# Patient Record
Sex: Male | Born: 1940 | Race: White | Hispanic: No | Smoking: Never smoker
Health system: Southern US, Community
[De-identification: ages and names within clinical notes are randomized; demographics above are authoritative.]

## PROBLEM LIST (undated history)

## (undated) ENCOUNTER — Emergency Department (HOSPITAL_COMMUNITY): Payer: Medicare Other | Source: Home / Self Care

## (undated) DIAGNOSIS — H9190 Unspecified hearing loss, unspecified ear: Secondary | ICD-10-CM

## (undated) DIAGNOSIS — D509 Iron deficiency anemia, unspecified: Secondary | ICD-10-CM

## (undated) DIAGNOSIS — I509 Heart failure, unspecified: Secondary | ICD-10-CM

## (undated) DIAGNOSIS — L03116 Cellulitis of left lower limb: Secondary | ICD-10-CM

## (undated) DIAGNOSIS — E538 Deficiency of other specified B group vitamins: Secondary | ICD-10-CM

## (undated) DIAGNOSIS — I4891 Unspecified atrial fibrillation: Secondary | ICD-10-CM

## (undated) DIAGNOSIS — E785 Hyperlipidemia, unspecified: Secondary | ICD-10-CM

## (undated) DIAGNOSIS — R040 Epistaxis: Secondary | ICD-10-CM

## (undated) DIAGNOSIS — R339 Retention of urine, unspecified: Secondary | ICD-10-CM

## (undated) DIAGNOSIS — Q2112 Patent foramen ovale: Secondary | ICD-10-CM

## (undated) DIAGNOSIS — Q211 Atrial septal defect: Secondary | ICD-10-CM

## (undated) DIAGNOSIS — I10 Essential (primary) hypertension: Secondary | ICD-10-CM

## (undated) DIAGNOSIS — J3489 Other specified disorders of nose and nasal sinuses: Secondary | ICD-10-CM

## (undated) DIAGNOSIS — R319 Hematuria, unspecified: Secondary | ICD-10-CM

## (undated) HISTORY — DX: Deficiency of other specified B group vitamins: E53.8

## (undated) HISTORY — DX: Unspecified atrial fibrillation: I48.91

## (undated) HISTORY — PX: HERNIA REPAIR: SHX51

## (undated) HISTORY — DX: Hematuria, unspecified: R31.9

## (undated) HISTORY — DX: Hyperlipidemia, unspecified: E78.5

## (undated) HISTORY — DX: Epistaxis: R04.0

## (undated) HISTORY — DX: Iron deficiency anemia, unspecified: D50.9

## (undated) HISTORY — DX: Patent foramen ovale: Q21.12

## (undated) HISTORY — PX: CATARACT EXTRACTION: SUR2

## (undated) HISTORY — DX: Essential (primary) hypertension: I10

## (undated) HISTORY — PX: ROTATOR CUFF REPAIR: SHX139

## (undated) HISTORY — DX: Atrial septal defect: Q21.1

---

## 1998-06-28 HISTORY — PX: SPLENECTOMY: SUR1306

## 2000-12-16 ENCOUNTER — Ambulatory Visit (HOSPITAL_COMMUNITY): Admission: RE | Admit: 2000-12-16 | Discharge: 2000-12-16 | Payer: Self-pay | Admitting: Internal Medicine

## 2000-12-26 ENCOUNTER — Other Ambulatory Visit: Admission: RE | Admit: 2000-12-26 | Discharge: 2000-12-26 | Payer: Self-pay | Admitting: Family Medicine

## 2001-05-01 ENCOUNTER — Encounter: Payer: Self-pay | Admitting: Family Medicine

## 2001-05-01 ENCOUNTER — Ambulatory Visit (HOSPITAL_COMMUNITY): Admission: RE | Admit: 2001-05-01 | Discharge: 2001-05-01 | Payer: Self-pay | Admitting: Family Medicine

## 2002-07-11 ENCOUNTER — Ambulatory Visit: Admission: RE | Admit: 2002-07-11 | Discharge: 2002-07-11 | Payer: Self-pay | Admitting: *Deleted

## 2006-08-22 ENCOUNTER — Ambulatory Visit (HOSPITAL_COMMUNITY): Admission: RE | Admit: 2006-08-22 | Discharge: 2006-08-22 | Payer: Self-pay | Admitting: Family Medicine

## 2006-09-02 ENCOUNTER — Ambulatory Visit (HOSPITAL_COMMUNITY): Admission: RE | Admit: 2006-09-02 | Discharge: 2006-09-02 | Payer: Self-pay | Admitting: Internal Medicine

## 2006-09-02 ENCOUNTER — Ambulatory Visit: Payer: Self-pay | Admitting: Internal Medicine

## 2009-05-21 ENCOUNTER — Ambulatory Visit (HOSPITAL_COMMUNITY): Admission: RE | Admit: 2009-05-21 | Discharge: 2009-05-21 | Payer: Self-pay | Admitting: Family Medicine

## 2010-04-28 ENCOUNTER — Emergency Department (HOSPITAL_COMMUNITY): Admission: EM | Admit: 2010-04-28 | Discharge: 2010-04-28 | Payer: Self-pay | Admitting: Emergency Medicine

## 2010-08-17 ENCOUNTER — Ambulatory Visit (HOSPITAL_COMMUNITY)
Admission: RE | Admit: 2010-08-17 | Discharge: 2010-08-17 | Disposition: A | Discharge status: Home / Self Care | Payer: MEDICARE | Source: Ambulatory Visit | Attending: Family Medicine | Admitting: Family Medicine

## 2010-08-17 ENCOUNTER — Encounter (HOSPITAL_COMMUNITY): Payer: Self-pay

## 2010-08-17 ENCOUNTER — Other Ambulatory Visit (HOSPITAL_COMMUNITY): Payer: Self-pay | Admitting: Family Medicine

## 2010-08-17 DIAGNOSIS — J40 Bronchitis, not specified as acute or chronic: Secondary | ICD-10-CM

## 2010-08-17 DIAGNOSIS — IMO0002 Reserved for concepts with insufficient information to code with codable children: Secondary | ICD-10-CM

## 2010-08-17 DIAGNOSIS — J449 Chronic obstructive pulmonary disease, unspecified: Secondary | ICD-10-CM | POA: Insufficient documentation

## 2010-08-17 DIAGNOSIS — J4489 Other specified chronic obstructive pulmonary disease: Secondary | ICD-10-CM | POA: Insufficient documentation

## 2010-08-17 DIAGNOSIS — R059 Cough, unspecified: Secondary | ICD-10-CM | POA: Insufficient documentation

## 2010-08-17 DIAGNOSIS — R0602 Shortness of breath: Secondary | ICD-10-CM | POA: Insufficient documentation

## 2010-08-17 DIAGNOSIS — R05 Cough: Secondary | ICD-10-CM | POA: Insufficient documentation

## 2010-08-17 HISTORY — DX: Essential (primary) hypertension: I10

## 2010-08-18 ENCOUNTER — Encounter: Payer: Self-pay | Admitting: Internal Medicine

## 2010-08-18 ENCOUNTER — Encounter: Payer: Self-pay | Admitting: Urgent Care

## 2010-08-18 ENCOUNTER — Ambulatory Visit (INDEPENDENT_AMBULATORY_CARE_PROVIDER_SITE_OTHER): Payer: MEDICARE | Admitting: Urgent Care

## 2010-08-18 DIAGNOSIS — R1901 Right upper quadrant abdominal swelling, mass and lump: Secondary | ICD-10-CM

## 2010-08-18 DIAGNOSIS — K219 Gastro-esophageal reflux disease without esophagitis: Secondary | ICD-10-CM

## 2010-08-18 DIAGNOSIS — D518 Other vitamin B12 deficiency anemias: Secondary | ICD-10-CM | POA: Insufficient documentation

## 2010-08-18 DIAGNOSIS — D509 Iron deficiency anemia, unspecified: Secondary | ICD-10-CM | POA: Insufficient documentation

## 2010-08-20 ENCOUNTER — Encounter (INDEPENDENT_AMBULATORY_CARE_PROVIDER_SITE_OTHER): Payer: Self-pay | Admitting: *Deleted

## 2010-08-21 ENCOUNTER — Encounter: Payer: Self-pay | Admitting: Urgent Care
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
## 2010-08-25 NOTE — Letter (Signed)
Summary: Recall, Screening Colonoscopy Only  [facility]  [address]   [place], [place] [address]   Phone: [phone]  Fax: [phone]    August 20, 2010  [employer] [address] [place], [place]  [address]  [place] Dec 31, 1940   Dear Mr. DINGLEY,   Our records indicate it is time to schedule your colonoscopy.   Please call our office at [phone] and ask for the nurse.   Thank you, Hendricks Limes, LPN Cloria Spring, LPN  [facility] Ph: [phone]   Fax: [phone]

## 2010-08-25 NOTE — Letter (Signed)
Summary: [facility]   Imported By: Rexene Alberts 08/21/2010 13:41:35  _____________________________________________________________________  External Attachment:    Type:   Image     Comment:   External Document

## 2010-08-25 NOTE — Letter (Addendum)
Summary: [facility] POSS EGD ORDER  [facility] EGD ORDER   Imported By: Ave Filter 08/18/2010 16:15:23  _____________________________________________________________________  External Attachment:    Type:   Image     Comment:   External Document  Appended Document: [facility] POSS EGD ORDER Patients appt canceled by Dr. [name] due to cardiac problems

## 2010-09-01 ENCOUNTER — Ambulatory Visit (HOSPITAL_COMMUNITY): Admission: RE | Admit: 2010-09-01 | Payer: MEDICARE | Source: Ambulatory Visit | Admitting: Internal Medicine

## 2010-09-01 ENCOUNTER — Encounter: Payer: MEDICARE | Admitting: Internal Medicine

## 2010-09-01 HISTORY — PX: NM MYOCAR PERF WALL MOTION: HXRAD629

## 2010-09-08 LAB — CBC
Hemoglobin: 9.5 g/dL — ABNORMAL LOW (ref 13.0–17.0)
MCHC: 32.1 g/dL (ref 30.0–36.0)
Platelets: 398 10*3/uL (ref 150–400)
RBC: 3.6 MIL/uL — ABNORMAL LOW (ref 4.22–5.81)

## 2010-09-08 LAB — BASIC METABOLIC PANEL
Calcium: 9.7 mg/dL (ref 8.4–10.5)
Creatinine, Ser: 1.13 mg/dL (ref 0.4–1.5)
GFR calc Af Amer: >60 mL/min (ref >60)
GFR calc non Af Amer: >60 mL/min (ref >60)
Glucose, Bld: 75 mg/dL (ref 70–99)
Sodium: 143 mEq/L (ref 135–145)

## 2010-09-08 LAB — DIFFERENTIAL
Basophils Relative: 1 % (ref 0–1)
Monocytes Relative: 12 % (ref 3–12)
Neutro Abs: 2.3 10*3/uL (ref 1.7–7.7)
Neutrophils Relative %: 37 % — ABNORMAL LOW (ref 43–77)

## 2010-09-08 LAB — PROTIME-INR
INR: 1.05 (ref 0.00–1.49)
Prothrombin Time: 13.9 seconds (ref 11.6–15.2)

## 2010-11-13 NOTE — Op Note (Signed)
NAME:  Justin [name], Justin [name]                   ACCOUNT NO.:  [id number]   MEDICAL RECORD NO.:  [id number]          PATIENT TYPE:  AMB   LOCATION:  DAY                           FACILITY:  [facility]   PHYSICIAN:  R. Roetta Sessions, M.D. DATE OF BIRTH:  1940/12/07   DATE OF PROCEDURE:  09/02/2006  DATE OF DISCHARGE:                               OPERATIVE REPORT   INDICATIONS FOR PROCEDURE:  The patient is a 70 year old gentleman with  history colonic adenomas last colonoscopy 2002.  He has no bowel  symptoms currently. Colonoscopy is now being done as surveillance  maneuver.  This approach has discussed with the patient at length.  Potential risks, benefits and alternatives have been reviewed, questions  answered.  He is agreeable.  Please see documentation medical record.   PROCEDURE NOTE:  O2 saturation, blood pressure, pulse and respirations  monitored throughout entire procedure.  Conscious sedation Versed 2 mg  IV, Demerol 50 mg IV in divided doses.   INSTRUMENT:  Pentax video chip system.   FINDINGS:  Digital rectal exam revealed no abnormalities.   ENDOSCOPIC FINDINGS:  The prep was adequate.  Examination colonic mucosa  was undertaken from the rectosigmoid junction through the left,  transverse, right colon to appendiceal orifice, ileocecal valve and  cecum.  These structures were well seen and photographed for the record.  From this level scope was withdrawn.  All previously mentioned  mucosal  surfaces were again seen.  The colon appeared normal.  Scope was pulled  down to the rectum.  Thorough examination rectal mucosa including  retroflex view anal verge was undertaken.  The rectal mucosa appeared  normal.  The patient tolerated the procedure well, was reacted in  endoscopy.   IMPRESSION:  1. Normal rectum.  2. Normal colon.   RECOMMENDATIONS:  To repeat surveillance colonoscopy in 5 years.      Jonathon Bellows, M.D.  Electronically Signed     RMR/MEDQ  D:  09/02/2006  T:   09/02/2006  Job:  [id number]   cc:   Mila Homer. [name], M.D.  Fax: [phone]

## 2010-11-13 NOTE — Cardiovascular Report (Signed)
   NAME:  Justin [name], Justin [name]                             ACCOUNT NO.:  [id number]   MEDICAL RECORD NO.:  [id number]                   PATIENT TYPE:  AMB   LOCATION:  DFTL                                 FACILITY:  [facility]   PHYSICIAN:  Darlin Priestly, M.D.             DATE OF BIRTH:  [birth year]   DATE OF PROCEDURE:  07/11/2002  DATE OF DISCHARGE:                              CARDIAC CATHETERIZATION   PROCEDURE:  Transesophageal echocardiogram.   ATTENDING PHYSICIAN:  Darlin Priestly, M.D.   COMPLICATIONS:  None.   INDICATIONS FOR PROCEDURE:  The patient is a 70 year old male patient of Dr.  Kem Boroughs who recently underwent a 2D echocardiogram with noted left and  right atrial enlargement and questionable PFO.  He is now referred for  transesophageal echocardiogram to evaluate his intra-atrial septum.   DESCRIPTION OF PROCEDURE:  After giving informed written consent, the  patient was brought to the endoscopy suite in a fasting state.  The patient  had received 75 mg of Demerol and 2 mg of Versed.  The patient underwent  successful and uncomplicated transesophageal echocardiogram.   FINDINGS:  1. The left ventricle appeared to be in normal in size with normal systolic     function.  The estimated EF was 60%.  2. There was mild thickening of the aortic valve leaflets with no evidence     of significant area stenosis and trivial aortic regurgitation.  3. Mildly thickened mitral valve leaflets and mild mitral regurgitation.   IMPRESSION:  1. Tricuspid valve with trivial tricuspid regurgitation.  2. No evidence of intra-cardiac mass or thrombi noted.  3. There does appear to be a small patent foramen ovale with right-to-left     but no left-to-right shunting noted by color and contrast echocardiogram.  4. There is moderate left atrial enlargement.  5. Normal descending thoracic aorta.   CONCLUSION:  Successful transesophageal echocardiogram with findings noted  above.                                           Darlin Priestly, M.D.     RHM/MEDQ  D:  07/11/2002  T:  07/11/2002  Job:  [id number]   cc:   Dani Gobble, M.D.  [phone] [address]  [place]  [place] [address]  Fax: [phone]

## 2010-11-18 LAB — COMPREHENSIVE METABOLIC PANEL
ALT: 14 U/L (ref 10–40)
BUN: 27 mg/dL — AB (ref 4–21)
CO2: 24 mmol/L
Calcium: 10.2 mg/dL
Chloride: 106 mmol/L
Creat: 0.99
Glucose: 140
Total Bilirubin: 0.5 mg/dL

## 2010-11-18 LAB — CBC WITH DIFFERENTIAL/PLATELET
WBC: 6.6
platelet count: 419

## 2010-11-18 LAB — HEMOGLOBIN A1C: Hgb A1c MFr Bld: 7 % — AB (ref 4.0–6.0)

## 2011-03-24 ENCOUNTER — Encounter: Payer: Self-pay | Admitting: Urgent Care

## 2011-03-24 ENCOUNTER — Ambulatory Visit (INDEPENDENT_AMBULATORY_CARE_PROVIDER_SITE_OTHER): Payer: Medicare Other | Admitting: Urgent Care

## 2011-03-24 DIAGNOSIS — K219 Gastro-esophageal reflux disease without esophagitis: Secondary | ICD-10-CM

## 2011-03-24 DIAGNOSIS — R160 Hepatomegaly, not elsewhere classified: Secondary | ICD-10-CM

## 2011-03-24 DIAGNOSIS — D518 Other vitamin B12 deficiency anemias: Secondary | ICD-10-CM

## 2011-03-24 DIAGNOSIS — D509 Iron deficiency anemia, unspecified: Secondary | ICD-10-CM
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
## 2011-03-24 NOTE — Patient Instructions (Signed)
KEEP Colonoscopy & EGD as planned Do not take iron for 1 week before tests Take 1/2 metformin in morning and evening before your test, also take 1/2 glucotrol the day before your test Do not take all of your diabetes the day of the procedure, but bring all your meds to the hospital

## 2011-03-24 NOTE — Assessment & Plan Note (Addendum)
Hx IDA with normalization of Hgb on iron therapy for 7 months.  Hx heme positive stools.  Now on coumadin.  Hx chronic GERD.  Colonoscopy to r/o colorectal carcinoma, polyp, source for occult GI bleeding.  We did discuss the fact that he is at a slightly higher risk of GI bleeding given the fact that he is on Coumadin, however the benefits of remaining on the Coumadin may outweigh the risk of bleeding. We discussed the life threatening nature of an embolic event. I discussed his situation regarding his coumadin with Dr Jena Gauss as to whether he should remain on without intervention vs. stopping coumadin prior to the procedure.  Dr. Jena Gauss would like to have his coumadin held for 4 days.  We contacted Dr. Tonia Ghent to see if this would be safe.  He states that he can stop his coumadin for up to 5 days prior to procedure,  Other risks discussed include reaction to the medication, perforation, and infection. He agrees with all the above and consent will be obtained.  KEEP Colonoscopy & EGD as planned Do not take iron for 1 week before tests Take 1/2 metformin in morning and evening before your test, also take 1/2 glucotrol the day before your test Do not take all of your diabetes the day of the procedure, but bring all your meds to the hospital

## 2011-03-24 NOTE — Assessment & Plan Note (Signed)
Palpable fullness RUQ ?hepatomegaly.  Normal LFTs.  ?scar tissue from multiple abdominal wounds/surgeries.   Further work-up pending procedures.

## 2011-03-24 NOTE — Assessment & Plan Note (Signed)
EGD at time of colonoscopy to look for PUD, h pylori gastritis, erosive esophagitis, Barrett's esophagus.  May benefit from PPI.  Await findings.

## 2011-03-24 NOTE — Assessment & Plan Note (Signed)
Per PCP on oral B12 replacement

## 2011-03-25 ENCOUNTER — Other Ambulatory Visit: Payer: Self-pay | Admitting: Gastroenterology

## 2011-03-25 DIAGNOSIS — D509 Iron deficiency anemia, unspecified: Secondary | ICD-10-CM

## 2011-03-25 NOTE — Progress Notes (Signed)
Cc to PCP 

## 2011-03-29 HISTORY — PX: COLONOSCOPY: SHX174

## 2011-03-29 HISTORY — PX: ESOPHAGOGASTRODUODENOSCOPY: SHX1529

## 2011-04-01 NOTE — Progress Notes (Signed)
Please call Dr. Horton Marshall office to find out why patient is on Coumadin?  Pt believes A fib. Can his Coumadin be stopped for 4 days prior to his colonoscopy and EGD? Please let me ASAP. Thanks

## 2011-04-02 NOTE — Progress Notes (Signed)
Called Kristen at [facility]. She will check on this and call us back.

## 2011-04-02 NOTE — Progress Notes (Signed)
Julie-please let pt know he needs to stop his coumadin 4 days before his procedure Thanks

## 2011-04-02 NOTE — Progress Notes (Signed)
Sent request to [facility] and vascular.

## 2011-04-05 ENCOUNTER — Encounter: Payer: Self-pay | Admitting: Urgent Care

## 2011-04-05 NOTE — Progress Notes (Signed)
pts wife aware, she asked that I mail a letter as well just to remind them. Letter in mail to pt

## 2011-04-19 ENCOUNTER — Telehealth: Payer: Self-pay | Admitting: Gastroenterology

## 2011-04-19 NOTE — Telephone Encounter (Signed)
Pts wife called- pt has not been on clear liquids- they forgot- and asked for procedure to be moved- TCS/EGD is now scheduled for 04/21/11 @12 :45

## 2011-04-20 MED ORDER — SODIUM CHLORIDE 0.45 % IV SOLN
Freq: Once | INTRAVENOUS | Status: AC
  Administered 2011-04-21: 10:00:00 via INTRAVENOUS

## 2011-04-21 ENCOUNTER — Other Ambulatory Visit: Payer: Self-pay | Admitting: Internal Medicine

## 2011-04-21 ENCOUNTER — Encounter (HOSPITAL_COMMUNITY): Payer: Self-pay

## 2011-04-21 ENCOUNTER — Encounter (HOSPITAL_COMMUNITY)
Admission: RE | Disposition: A | Discharge status: Home / Self Care | Payer: Self-pay | Source: Ambulatory Visit | Attending: Internal Medicine

## 2011-04-21 ENCOUNTER — Ambulatory Visit (HOSPITAL_COMMUNITY)
Admission: RE | Admit: 2011-04-21 | Discharge: 2011-04-21 | Disposition: A | Discharge status: Home / Self Care | Payer: Medicare Other | Source: Ambulatory Visit | Attending: Internal Medicine | Admitting: Internal Medicine

## 2011-04-21 DIAGNOSIS — D509 Iron deficiency anemia, unspecified: Secondary | ICD-10-CM | POA: Insufficient documentation

## 2011-04-21 DIAGNOSIS — E785 Hyperlipidemia, unspecified: Secondary | ICD-10-CM | POA: Insufficient documentation

## 2011-04-21 DIAGNOSIS — E119 Type 2 diabetes mellitus without complications: Secondary | ICD-10-CM | POA: Insufficient documentation

## 2011-04-21 DIAGNOSIS — Z7901 Long term (current) use of anticoagulants: Secondary | ICD-10-CM | POA: Insufficient documentation

## 2011-04-21 DIAGNOSIS — K621 Rectal polyp: Secondary | ICD-10-CM

## 2011-04-21 DIAGNOSIS — K449 Diaphragmatic hernia without obstruction or gangrene: Secondary | ICD-10-CM | POA: Insufficient documentation

## 2011-04-21 DIAGNOSIS — K219 Gastro-esophageal reflux disease without esophagitis: Secondary | ICD-10-CM

## 2011-04-21 DIAGNOSIS — I10 Essential (primary) hypertension: Secondary | ICD-10-CM | POA: Insufficient documentation

## 2011-04-21 DIAGNOSIS — D128 Benign neoplasm of rectum: Secondary | ICD-10-CM | POA: Insufficient documentation

## 2011-04-21 DIAGNOSIS — D129 Benign neoplasm of anus and anal canal: Secondary | ICD-10-CM | POA: Insufficient documentation

## 2011-04-21 DIAGNOSIS — R195 Other fecal abnormalities: Secondary | ICD-10-CM

## 2011-04-21 DIAGNOSIS — Z79899 Other long term (current) drug therapy: Secondary | ICD-10-CM | POA: Insufficient documentation

## 2011-04-21 DIAGNOSIS — K620 Anal polyp: Secondary | ICD-10-CM

## 2011-04-21 DIAGNOSIS — K921 Melena: Secondary | ICD-10-CM | POA: Insufficient documentation

## 2011-04-21 DIAGNOSIS — K5730 Diverticulosis of large intestine without perforation or abscess without bleeding: Secondary | ICD-10-CM

## 2011-04-21 SURGERY — COLONOSCOPY WITH ESOPHAGOGASTRODUODENOSCOPY (EGD)
Anesthesia: Moderate Sedation

## 2011-04-21 MED ORDER — MIDAZOLAM HCL 5 MG/5ML IJ SOLN
INTRAMUSCULAR | Status: DC | PRN
  Administered 2011-04-21 (×2): 1 mg via INTRAVENOUS
  Administered 2011-04-21: 2 mg via INTRAVENOUS

## 2011-04-21 MED ORDER — STERILE WATER FOR IRRIGATION IR SOLN
Status: DC | PRN
  Administered 2011-04-21: 12:00:00

## 2011-04-21 MED ORDER — MIDAZOLAM HCL 5 MG/5ML IJ SOLN
INTRAMUSCULAR | Status: AC
  Filled 2011-04-21: qty 5

## 2011-04-21 MED ORDER — MEPERIDINE HCL 100 MG/ML IJ SOLN
INTRAMUSCULAR | Status: AC
  Filled 2011-04-21: qty 1

## 2011-04-21 MED ORDER — MEPERIDINE HCL 100 MG/ML IJ SOLN
INTRAMUSCULAR | Status: DC | PRN
  Administered 2011-04-21: 50 mg via INTRAVENOUS

## 2011-04-21 MED ORDER — BUTAMBEN-TETRACAINE-BENZOCAINE 2-2-14 % EX AERO
INHALATION_SPRAY | CUTANEOUS | Status: DC | PRN
  Administered 2011-04-21: 1 via TOPICAL
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
## 2011-05-18 ENCOUNTER — Ambulatory Visit (INDEPENDENT_AMBULATORY_CARE_PROVIDER_SITE_OTHER): Payer: Medicare Other | Admitting: Gastroenterology

## 2011-05-18 ENCOUNTER — Telehealth: Payer: Self-pay

## 2011-05-18 ENCOUNTER — Ambulatory Visit: Payer: Medicare Other | Admitting: Gastroenterology

## 2011-05-18 ENCOUNTER — Encounter: Payer: Self-pay | Admitting: Gastroenterology

## 2011-05-18 VITALS — BP 130/80 | HR 70 | Temp 97.6°F | Ht 72.0 in | Wt 228.8 lb

## 2011-05-18 DIAGNOSIS — R1901 Right upper quadrant abdominal swelling, mass and lump: Secondary | ICD-10-CM

## 2011-05-18 DIAGNOSIS — D509 Iron deficiency anemia, unspecified: Secondary | ICD-10-CM

## 2011-05-18 DIAGNOSIS — K219 Gastro-esophageal reflux disease without esophagitis: Secondary | ICD-10-CM

## 2011-05-18 DIAGNOSIS — D518 Other vitamin B12 deficiency anemias: Secondary | ICD-10-CM

## 2011-05-18 DIAGNOSIS — R160 Hepatomegaly, not elsewhere classified: Secondary | ICD-10-CM

## 2011-05-18 NOTE — Patient Instructions (Signed)
We will recheck your lab work in February 2013 to followup on anemia. You need to have an abdominal ultrasound to take a look at your liver. Please call us and let us know what day you would like to have this done in order coordinate it at the same time as your appointment with Dr. [name].

## 2011-05-18 NOTE — Assessment & Plan Note (Signed)
Fullness in the right upper quadrant region, questionable scar tissue versus hepatomegaly. Recommend abdominal ultrasound for further evaluation, patient would like to postpone until January. He will call to schedule when he is ready.

## 2011-05-18 NOTE — Progress Notes (Signed)
Primary Care Physician: Milana Obey, MD  Primary Gastroenterologist:  Roetta Sessions, MD  Chief Complaint  Patient presents with  . Follow-up    HPI: Justin [name] is a 70 y.o. male here for followup of recent procedures. He had an EGD and colonoscopy done to evaluate for iron deficiency anemia, Hemoccult-positive stool. He had a hiatal hernia, hyperplastic rectal polyps.  Patient is without complaints today. Denies melena, rectal bleeding, constipation, diarrhea, abdominal pain, vomiting, dysphagia. Rare heartburn.   The last time he was seen here, there was concern for possible hepatomegaly. Workup postponed until his procedures done.  Current Outpatient Prescriptions  Medication Sig Dispense Refill  . albuterol (PROAIR HFA) 108 (90 BASE) MCG/ACT inhaler Inhale 2 puffs into the lungs every 6 (six) hours as needed. For shortness of breath      . CINNAMON PO Take 1,000 mg by mouth daily.        Marland Kitchen diltiazem (CARDIZEM CD) 240 MG 24 hr capsule Take 240 mg by mouth daily.       Marland Kitchen doxazosin (CARDURA) 8 MG tablet Take 8 mg by mouth at bedtime.        . ferrous sulfate 325 (65 FE) MG tablet Take 325 mg by mouth daily with breakfast.        . furosemide (LASIX) 40 MG tablet Take 40 mg by mouth 2 (two) times daily.        Marland Kitchen gemfibrozil (LOPID) 600 MG tablet Take 600 mg by mouth 2 (two) times daily before a meal.        . glipiZIDE (GLUCOTROL) 10 MG tablet Take 10 mg by mouth daily.        . metFORMIN (GLUCOPHAGE) 1000 MG tablet Take 1,000 mg by mouth 2 (two) times daily.        . phenylephrine (NEO-SYNEPHRINE) 0.5 % nasal solution Place 1 drop into the nose every 4 (four) hours as needed. For congestion      . vitamin B-12 (CYANOCOBALAMIN) 1000 MCG tablet Take 1,000 mcg by mouth daily.        Marland Kitchen warfarin (COUMADIN) 5 MG tablet Take 5-7.5 mg by mouth daily. Takes 7.5 mg on Tuesday, Thursday, & Saturday. & takes 5 mg all other days        Allergies as of 05/18/2011  . (No Known Allergies)     ROS:  General: Negative for anorexia, weight loss, fever, chills, fatigue, weakness. ENT: Negative for hoarseness, difficulty swallowing , nasal congestion. CV: Negative for chest pain, angina, palpitations, dyspnea on exertion, peripheral edema.  Respiratory: Negative for dyspnea at rest, dyspnea on exertion, cough, sputum, wheezing.  GI: See history of present illness. GU:  Negative for dysuria, hematuria, urinary incontinence, urinary frequency, nocturnal urination.  Endo: Negative for unusual weight change.    Physical Examination:   BP 130/80  Pulse 70  Temp(Src) 97.6 F (36.4 C) (Temporal)  Ht 6' (1.829 m)  Wt 228 lb 12.8 oz (103.783 kg)  BMI 31.03 kg/m2  General: Well-nourished, well-developed in no acute distress.  Eyes: No icterus. Mouth: Oropharyngeal mucosa moist and pink , no lesions erythema or exudate. Lungs: Clear to auscultation bilaterally.  Heart: Regular rate and rhythm, no murmurs rubs or gallops.  Abdomen: Bowel sounds are normal, nontender, nondistended, fullness in the right upper quadrant, no abdominal bruits or hernia , no rebound or guarding.   Extremities: No lower extremity edema. No clubbing or deformities. Neuro: Alert and oriented x 4   Skin: Warm and dry, no  jaundice.   Psych: Alert and cooperative, normal mood and affect.

## 2011-05-18 NOTE — Assessment & Plan Note (Signed)
Iron deficiency anemia, B12 deficiency, Hemoccult-positive stool. His hemoglobin normalized on supplements. Nothing on EGD or colonoscopy to explain findings. Would recommend rechecking CBC in 3 months. Unless he shows decline in his hemoglobin, no further workup needed at this point.

## 2011-05-18 NOTE — Telephone Encounter (Signed)
Mrs. Goeller called and said they would like to schedule pt's [place] on 07/26/2011 in the afternoon. He has a 10:15 AM appt with Dr. [name] and wants to avoid a second trip to [place].

## 2011-05-18 NOTE — Progress Notes (Signed)
Cc to PCP 

## 2011-05-18 NOTE — Assessment & Plan Note (Signed)
Doing better at this point.

## 2011-05-24 NOTE — Progress Notes (Signed)
REVIEWED.  

## 2011-05-31 NOTE — Telephone Encounter (Signed)
Crystal is aware

## 2011-06-28 ENCOUNTER — Encounter: Payer: Self-pay | Admitting: Gastroenterology

## 2011-06-28 ENCOUNTER — Other Ambulatory Visit: Payer: Self-pay | Admitting: Gastroenterology

## 2011-06-28 DIAGNOSIS — R160 Hepatomegaly, not elsewhere classified: Secondary | ICD-10-CM

## 2011-07-26 ENCOUNTER — Ambulatory Visit (HOSPITAL_COMMUNITY)
Admission: RE | Admit: 2011-07-26 | Discharge: 2011-07-26 | Disposition: A | Discharge status: Home / Self Care | Payer: Medicare Other | Source: Ambulatory Visit | Attending: Gastroenterology | Admitting: Gastroenterology

## 2011-07-26 DIAGNOSIS — R932 Abnormal findings on diagnostic imaging of liver and biliary tract: Secondary | ICD-10-CM | POA: Insufficient documentation

## 2011-07-26 DIAGNOSIS — R160 Hepatomegaly, not elsewhere classified: Secondary | ICD-10-CM

## 2011-07-26 NOTE — Progress Notes (Signed)
Quick Note:  Please let patient know, liver slighly prominent in size. Gallbladder with stones, ?chronic cholecystitis. Right kidney lesions, ?complex cyst. Needs further evaluation.  Please schedule MRI abd with/without contrast to further evaluate right kidney lesion seen on u/s. (make sure he does not have metal in his body and if so, check with mri tech to see if he can have mri done). Please find out if patient has his labs yet, possibly having done at PCP. Please check. Arrange for ov here to follow MRI results.  If patient develops ruq pain, n/v, he should go to ED. We will discuss if gb needs to come out at next visit but go to ed in interim if develops symptoms.   ______
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
## 2011-07-28 ENCOUNTER — Other Ambulatory Visit: Payer: Self-pay | Admitting: Gastroenterology

## 2011-07-28 DIAGNOSIS — N289 Disorder of kidney and ureter, unspecified: Secondary | ICD-10-CM

## 2011-07-28 NOTE — Progress Notes (Signed)
Requested.

## 2011-08-03 ENCOUNTER — Ambulatory Visit (HOSPITAL_COMMUNITY)
Admission: RE | Admit: 2011-08-03 | Discharge: 2011-08-03 | Disposition: A | Discharge status: Home / Self Care | Payer: Medicare Other | Source: Ambulatory Visit | Attending: Gastroenterology | Admitting: Gastroenterology

## 2011-08-03 DIAGNOSIS — N289 Disorder of kidney and ureter, unspecified: Secondary | ICD-10-CM

## 2011-08-03 DIAGNOSIS — K8020 Calculus of gallbladder without cholecystitis without obstruction: Secondary | ICD-10-CM | POA: Insufficient documentation

## 2011-08-03 DIAGNOSIS — K449 Diaphragmatic hernia without obstruction or gangrene: Secondary | ICD-10-CM | POA: Insufficient documentation

## 2011-08-03 DIAGNOSIS — Q619 Cystic kidney disease, unspecified: Secondary | ICD-10-CM | POA: Insufficient documentation

## 2011-08-03 MED ORDER — GADOBENATE DIMEGLUMINE 529 MG/ML IV SOLN
20.0000 mL | Freq: Once | INTRAVENOUS | Status: AC | PRN
  Administered 2011-08-03: 20 mL via INTRAVENOUS

## 2011-08-05 NOTE — Progress Notes (Signed)
Quick Note:  Benign bilateral renal cysts. Very good! He has gallstones but no evidence of acute gb or chronic gb disease.  On MRI liver OK, mildly prominent of u/s. LFTs normal.  Recommend tight control of DM, slow gradual weight loss towards initial goal of 10 pound in next three months.  CBC as planned in future. ______

## 2012-02-22 ENCOUNTER — Encounter (HOSPITAL_COMMUNITY): Payer: Self-pay | Admitting: Emergency Medicine

## 2012-02-22 ENCOUNTER — Emergency Department (HOSPITAL_COMMUNITY): Payer: Medicare Other

## 2012-02-22 ENCOUNTER — Observation Stay (HOSPITAL_COMMUNITY)
Admission: EM | Admit: 2012-02-22 | Discharge: 2012-02-24 | DRG: 605 | Disposition: A | Discharge status: Home / Self Care | Payer: Medicare Other | Attending: Family Medicine | Admitting: Family Medicine

## 2012-02-22 DIAGNOSIS — E785 Hyperlipidemia, unspecified: Secondary | ICD-10-CM | POA: Diagnosis present

## 2012-02-22 DIAGNOSIS — E538 Deficiency of other specified B group vitamins: Secondary | ICD-10-CM | POA: Diagnosis present

## 2012-02-22 DIAGNOSIS — M79609 Pain in unspecified limb: Secondary | ICD-10-CM | POA: Insufficient documentation

## 2012-02-22 DIAGNOSIS — I10 Essential (primary) hypertension: Secondary | ICD-10-CM | POA: Diagnosis present

## 2012-02-22 DIAGNOSIS — E119 Type 2 diabetes mellitus without complications: Secondary | ICD-10-CM | POA: Diagnosis present

## 2012-02-22 DIAGNOSIS — Z7901 Long term (current) use of anticoagulants: Secondary | ICD-10-CM

## 2012-02-22 DIAGNOSIS — I4891 Unspecified atrial fibrillation: Secondary | ICD-10-CM | POA: Diagnosis present

## 2012-02-22 DIAGNOSIS — X58XXXA Exposure to other specified factors, initial encounter: Secondary | ICD-10-CM

## 2012-02-22 DIAGNOSIS — Q2111 Secundum atrial septal defect: Secondary | ICD-10-CM

## 2012-02-22 DIAGNOSIS — S8010XA Contusion of unspecified lower leg, initial encounter: Principal | ICD-10-CM | POA: Diagnosis present

## 2012-02-22 DIAGNOSIS — Q211 Atrial septal defect: Secondary | ICD-10-CM

## 2012-02-22 DIAGNOSIS — D509 Iron deficiency anemia, unspecified: Secondary | ICD-10-CM | POA: Diagnosis present

## 2012-02-22 DIAGNOSIS — Z79899 Other long term (current) drug therapy: Secondary | ICD-10-CM

## 2012-02-22 DIAGNOSIS — L03116 Cellulitis of left lower limb: Secondary | ICD-10-CM

## 2012-02-22 DIAGNOSIS — Y93H9 Activity, other involving exterior property and land maintenance, building and construction: Secondary | ICD-10-CM

## 2012-02-22 LAB — CBC WITH DIFFERENTIAL/PLATELET
Eosinophils Relative: 2 % (ref 0–5)
HCT: 43.2 % (ref 39.0–52.0)
Hemoglobin: 14.4 g/dL (ref 13.0–17.0)
Lymphocytes Relative: 35 % (ref 12–46)
Lymphs Abs: 2.8 10*3/uL (ref 0.7–4.0)
MCV: 93.3 fL (ref 78.0–100.0)
Monocytes Absolute: 0.8 10*3/uL (ref 0.1–1.0)
Monocytes Relative: 11 % (ref 3–12)
RDW: 14.5 % (ref 11.5–15.5)
WBC: 7.8 10*3/uL (ref 4.0–10.5)

## 2012-02-22 LAB — BASIC METABOLIC PANEL
BUN: 14 mg/dL (ref 6–23)
CO2: 26 mEq/L (ref 19–32)
Calcium: 10.4 mg/dL (ref 8.4–10.5)
Creatinine, Ser: 0.81 mg/dL (ref 0.50–1.35)
Glucose, Bld: 79 mg/dL (ref 70–99)

## 2012-02-22 LAB — GLUCOSE, CAPILLARY: Glucose-Capillary: 88 mg/dL (ref 70–99)

## 2012-02-22 LAB — PROTIME-INR: INR: 2.96 — ABNORMAL HIGH (ref 0.00–1.49)

## 2012-02-22 MED ORDER — SODIUM CHLORIDE 0.9 % IJ SOLN
INTRAMUSCULAR | Status: AC
  Filled 2012-02-22: qty 3

## 2012-02-22 MED ORDER — KETOROLAC TROMETHAMINE 30 MG/ML IJ SOLN
30.0000 mg | Freq: Once | INTRAMUSCULAR | Status: AC
  Administered 2012-02-22: 30 mg via INTRAVENOUS
  Filled 2012-02-22: qty 1

## 2012-02-22 MED ORDER — INSULIN ASPART 100 UNIT/ML ~~LOC~~ SOLN: 0.0000 [IU] | Freq: Every day | SUBCUTANEOUS | Status: DC

## 2012-02-22 MED ORDER — DOXAZOSIN MESYLATE 8 MG PO TABS
8.0000 mg | ORAL_TABLET | Freq: Every day | ORAL | Status: DC
  Administered 2012-02-22 – 2012-02-23 (×2): 8 mg via ORAL
  Filled 2012-02-22 (×2): qty 1

## 2012-02-22 MED ORDER — ONDANSETRON HCL 4 MG/2ML IJ SOLN
4.0000 mg | Freq: Once | INTRAMUSCULAR | Status: AC
  Administered 2012-02-22: 4 mg via INTRAVENOUS
  Filled 2012-02-22: qty 2

## 2012-02-22 MED ORDER — PIPERACILLIN-TAZOBACTAM 3.375 G IVPB
3.3750 g | Freq: Three times a day (TID) | INTRAVENOUS | Status: DC
  Administered 2012-02-22 – 2012-02-24 (×5): 3.375 g via INTRAVENOUS
  Filled 2012-02-22 (×8): qty 50

## 2012-02-22 MED ORDER — HYDROCODONE-ACETAMINOPHEN 5-325 MG PO TABS
1.0000 | ORAL_TABLET | Freq: Four times a day (QID) | ORAL | Status: DC | PRN
  Administered 2012-02-22: 2 via ORAL
  Administered 2012-02-23: 1 via ORAL
  Administered 2012-02-23 (×2): 2 via ORAL
  Administered 2012-02-23: 1 via ORAL
  Administered 2012-02-24: 2 via ORAL
  Filled 2012-02-22 (×3): qty 2
  Filled 2012-02-22 (×2): qty 1
  Filled 2012-02-22 (×2): qty 2

## 2012-02-22 MED ORDER — FERROUS SULFATE 325 (65 FE) MG PO TABS
325.0000 mg | ORAL_TABLET | Freq: Two times a day (BID) | ORAL | Status: DC
  Administered 2012-02-22 – 2012-02-24 (×4): 325 mg via ORAL
  Filled 2012-02-22 (×4): qty 1

## 2012-02-22 MED ORDER — INSULIN ASPART 100 UNIT/ML ~~LOC~~ SOLN
0.0000 [IU] | Freq: Three times a day (TID) | SUBCUTANEOUS | Status: DC
  Administered 2012-02-23 – 2012-02-24 (×3): 1 [IU] via SUBCUTANEOUS

## 2012-02-22 MED ORDER — HYDROMORPHONE HCL PF 1 MG/ML IJ SOLN
0.5000 mg | Freq: Once | INTRAMUSCULAR | Status: AC
  Administered 2012-02-22: 0.5 mg via INTRAVENOUS
  Filled 2012-02-22: qty 1

## 2012-02-22 MED ORDER — WARFARIN SODIUM 5 MG PO TABS
5.0000 mg | ORAL_TABLET | Freq: Once | ORAL | Status: AC
  Administered 2012-02-22: 5 mg via ORAL
  Filled 2012-02-22: qty 1

## 2012-02-22 MED ORDER — VANCOMYCIN HCL IN DEXTROSE 1-5 GM/200ML-% IV SOLN
1000.0000 mg | Freq: Two times a day (BID) | INTRAVENOUS | Status: DC
  Administered 2012-02-23 – 2012-02-24 (×3): 1000 mg via INTRAVENOUS
  Filled 2012-02-22 (×3): qty 200

## 2012-02-22 MED ORDER — VANCOMYCIN HCL IN DEXTROSE 1-5 GM/200ML-% IV SOLN
1000.0000 mg | Freq: Once | INTRAVENOUS | Status: AC
  Administered 2012-02-22: 1000 mg via INTRAVENOUS
  Filled 2012-02-22: qty 200

## 2012-02-22 MED ORDER — WARFARIN - PHARMACIST DOSING INPATIENT: Freq: Every day | Status: DC

## 2012-02-22 NOTE — ED Notes (Signed)
Pt has knot to left lower leg. Pt states calf felt tight. Thought maybe a bug bit him.
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
## 2012-02-22 NOTE — ED Notes (Signed)
Crackers and diet drink given. Offered wife some.

## 2012-02-22 NOTE — ED Notes (Signed)
Patient c/o increased pain. EDP made aware, orders given.

## 2012-02-23 LAB — GLUCOSE, CAPILLARY
Glucose-Capillary: 98 mg/dL (ref 70–99)
Glucose-Capillary: 109 mg/dL — ABNORMAL HIGH (ref 70–99)

## 2012-02-23 LAB — PROTIME-INR: INR: 3.23 — ABNORMAL HIGH (ref 0.00–1.49)

## 2012-02-23 MED ORDER — VANCOMYCIN HCL IN DEXTROSE 1-5 GM/200ML-% IV SOLN
INTRAVENOUS | Status: AC
  Filled 2012-02-23: qty 200

## 2012-02-23 NOTE — Progress Notes (Signed)
Dr. Felecia Shelling paged, who is on call for Dr. [name], made aware of new admit from ER. Verbal admission orders taken. Family at bedside. Will continue to monitor.

## 2012-02-23 NOTE — Progress Notes (Signed)
Subjective: Complains of pain and swelling of the lt leg. No fever or chills.  Objective: Vital signs in last 24 hours: Temp:  [97.6 F (36.4 C)-97.9 F (36.6 C)] 97.6 F (36.4 C) (08/28 0539) Pulse Rate:  [72-100] 72  (08/28 0539) Resp:  [18-20] 20  (08/28 0539) BP: (118-134)/(76-92) 118/77 mmHg (08/28 0539) SpO2:  [92 %-99 %] 92 % (08/28 0539) Weight:  [97 kg (213 lb 13.5 oz)-97.07 kg (214 lb)] 97 kg (213 lb 13.5 oz) (08/27 2017) Weight change:  Last BM Date: 02/21/12  Intake/Output from previous day: 08/27 0701 - 08/28 0700 In: 540 [P.O.:240; IV Piggyback:300] Out: 300 [Urine:300]  PHYSICAL EXAM General appearance: alert and no distress Resp: clear to auscultation bilaterally Cardio: S1, S2 normal GI: soft, non-tender; bowel sounds normal; no masses,  no organomegaly Extremities: tender flactuating swelling measuring around 4cm on lt leg  Lab Results:    @labtest @ [facility] No results found for this basename: PHART,PCO2,PO2ART,TCO2,HCO3 in the last 72 hours CULTURES No results found for this or any previous visit (from the past 240 hour(s)). Studies/Results: US Venous Img Lower Unilateral Left  02/22/2012  *RADIOLOGY REPORT*  Clinical Data: Left calf protrusion.  LEFT LOWER EXTREMITY VENOUS DUPLEX ULTRASOUND  Technique:  Gray-scale sonography with graded compression, as well as color Doppler and duplex ultrasound, were performed to evaluate the deep venous system of the lower extremity from the level of the common femoral vein through the popliteal and proximal calf veins. Spectral Doppler was utilized to evaluate flow at rest and with distal augmentation maneuvers.  Comparison:  None.  Findings: Deep veins of the left lower extremity are normally patent and without evidence of thrombus.  There is a complex fluid collection in the posterior mid calf measuring roughly 4 x 2 x 4 cm.  This may represent hematoma versus abscess given appearance. This is not a typical location for a  Baker's cyst.  IMPRESSION: No evidence of left lower extremity DVT.  4 cm complex fluid collection of the mid calf having appearance of either hematoma or abscess.   Original Report Authenticated By: Reola Calkins, M.D.     Medications: I have reviewed the patient's current medications.  Assesment: 1. Cellulitis and abscess on the lt leg 2. DM type II 3. Hypertension 4. Atrial fibrillation on anticoagulation  Active Problems:  * No active hospital problems. *     Plan: 1. Continue IV antibiotics 2. Surgical consult 3. Continue anticoagulation. 4. Continue regular treatment.    LOS: 1 day   Tesla Keeler 02/23/2012, 8:12 AM
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
## 2012-02-23 NOTE — Consult Note (Signed)
ANTIBIOTIC CONSULT NOTE - INITIAL  Pharmacy Consult for Vancomycin Indication: cellulitis of leg  No Known Allergies  Patient Measurements: Height: 5\' 9"  (175.3 cm) Weight: 213 lb 13.5 oz (97 kg) IBW/kg (Calculated) : 70.7   Vital Signs: Temp: 97.6 F (36.4 C) (08/28 0539) Temp src: Oral (08/28 0539) BP: 118/77 mmHg (08/28 0539) Pulse Rate: 72  (08/28 0539) Intake/Output from previous day: 08/27 0701 - 08/28 0700 In: 540 [P.O.:240; IV Piggyback:300] Out: 300 [Urine:300] Intake/Output from this shift:    Labs:  [facility] 02/22/12 1546  WBC 7.8  HGB 14.4  PLT 320  LABCREA --  CREATININE 0.81   Estimated Creatinine Clearance: 94.7 ml/min (by C-G formula based on Cr of 0.81). No results found for this basename: VANCOTROUGH:2,VANCOPEAK:2,VANCORANDOM:2,GENTTROUGH:2,GENTPEAK:2,GENTRANDOM:2,TOBRATROUGH:2,TOBRAPEAK:2,TOBRARND:2,AMIKACINPEAK:2,AMIKACINTROU:2,AMIKACIN:2, in the last 72 hours   Microbiology: No results found for this or any previous visit (from the past 720 hour(s)).  Medical History: Past Medical History  Diagnosis Date  . Hypertension   . Diabetes mellitus   . IDA (iron deficiency anemia)   . Vitamin B 12 deficiency   . Hyperlipidemia   . HTN (hypertension)   . Epistaxis   . Atrial fibrillation     Sees Dr Tonia Ghent, pt/wife unsure but believe this is why he is on coumadin  . PFO (patent foramen ovale)    Medications:  Scheduled:    . doxazosin  8 mg Oral QHS  . ferrous sulfate  325 mg Oral BID  .  HYDROmorphone (DILAUDID) injection  0.5 mg Intravenous Once  . insulin aspart  0-5 Units Subcutaneous QHS  . insulin aspart  0-9 Units Subcutaneous TID WC  . ketorolac  30 mg Intravenous Once  . ondansetron  4 mg Intravenous Once  . piperacillin-tazobactam (ZOSYN)  IV  3.375 g Intravenous Q8H  . sodium chloride      . vancomycin  1,000 mg Intravenous Once  . vancomycin  1,000 mg Intravenous Q12H  . warfarin  5 mg Oral Once  . Warfarin - Pharmacist  Dosing Inpatient   Does not apply q1800   Assessment: 71yo male c/o pain and swelling in leg.  Good renal fxn. Estimated Creatinine Clearance: 94.7 ml/min (by C-G formula based on Cr of 0.81).  Goal of Therapy:  Vancomycin trough level 10-15 mcg/ml  Plan: Vancomycin 1gm iv q12hrs Check trough at steady state Monitor labs, renal fxn, and cultures per protocol  Valrie Hart A 02/23/2012,9:31 AM

## 2012-02-23 NOTE — Care Management Note (Signed)
    Page 1 of 1   02/24/2012     10:13:22 AM   CARE MANAGEMENT NOTE 02/24/2012  Patient:  Justin [name], Justin [name]   Account Number:  [id number]  Date Initiated:  02/23/2012  Documentation initiated by:  Sharrie Rothman  Subjective/Objective Assessment:   Pt admitted from home with cellulitis of left lower extremity. Pt lives with wife and will return home at discharge. Pt is independent with ADL's.     Action/Plan:   No CM/HH needs at this time. Will continue to follow for any needs.   Anticipated DC Date:  02/25/2012   Anticipated DC Plan:  HOME/SELF CARE      DC Planning Services  CM consult      Choice offered to / List presented to:             Status of service:  Completed, signed off Medicare Important Message given?   (If response is "NO", the following Medicare IM given date fields will be blank) Date Medicare IM given:   Date Additional Medicare IM given:    Discharge Disposition:  HOME/SELF CARE  Per UR Regulation:    If discussed at Long Length of Stay Meetings, dates discussed:    Comments:  02/24/12 1011 Arlyss Queen, RN BSN CM Pt discharged home today. No CM/HH needs noted.  02/23/12 1345 Arlyss Queen, RN BSN CM

## 2012-02-23 NOTE — Progress Notes (Signed)
UR Chart Review Completed  
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
## 2012-02-23 NOTE — Consult Note (Signed)
ANTICOAGULATION CONSULT NOTE - Initial Consult  Pharmacy Consult for Warfarin Indication: atrial fibrillation  No Known Allergies  Patient Measurements: Height: 5\' 9"  (175.3 cm) Weight: 213 lb 13.5 oz (97 kg) IBW/kg (Calculated) : 70.7   Vital Signs: Temp: 97.6 F (36.4 C) (08/28 0539) Temp src: Oral (08/28 0539) BP: 118/77 mmHg (08/28 0539) Pulse Rate: 72  (08/28 0539)  Labs:  Justin [name] 02/23/12 0533 02/22/12 2137 02/22/12 1546  HGB -- -- 14.4  HCT -- -- 43.2  PLT -- -- 320  APTT -- -- --  LABPROT 33.5* 31.3* --  INR 3.23* 2.96* --  HEPARINUNFRC -- -- --  CREATININE -- -- 0.81  CKTOTAL -- -- --  CKMB -- -- --  TROPONINI -- -- --    Estimated Creatinine Clearance: 94.7 ml/min (by C-G formula based on Cr of 0.81).  Medical History: Past Medical History  Diagnosis Date  . Hypertension   . Diabetes mellitus   . IDA (iron deficiency anemia)   . Vitamin B 12 deficiency   . Hyperlipidemia   . HTN (hypertension)   . Epistaxis   . Atrial fibrillation     Sees Dr Tonia Ghent, pt/wife unsure but believe this is why he is on coumadin  . PFO (patent foramen ovale)    Medications:  Prescriptions prior to admission  Medication Sig Dispense Refill  . albuterol (PROAIR HFA) 108 (90 BASE) MCG/ACT inhaler Inhale 2 puffs into the lungs every 6 (six) hours as needed. For shortness of breath      . CINNAMON PO Take 1,000 mg by mouth daily.        Marland Kitchen doxazosin (CARDURA) 8 MG tablet Take 8 mg by mouth at bedtime.        . ferrous sulfate 325 (65 FE) MG tablet Take 325 mg by mouth daily with breakfast.        . gemfibrozil (LOPID) 600 MG tablet Take 600 mg by mouth 2 (two) times daily before a meal.        . glipiZIDE (GLUCOTROL) 10 MG tablet Take 10 mg by mouth daily.        . metFORMIN (GLUCOPHAGE) 1000 MG tablet Take 1,000 mg by mouth 2 (two) times daily.        . vitamin B-12 (CYANOCOBALAMIN) 500 MCG tablet Take 1,000 mcg by mouth daily.      Marland Kitchen warfarin (COUMADIN) 5 MG tablet Take  5-7.5 mg by mouth daily. Takes 7.5 mg on Tuesdays and Fridays Takes 5 mg on all other days        Assessment: INR above goal today, trending upward.  Home dose of Coumadin noted.  Goal of Therapy:  INR 2-3 Monitor platelets by anticoagulation protocol: Yes   Plan: Omit Coumadin today INR daily  Justin [name], Justin [name] A 02/23/2012,9:28 AM

## 2012-02-24 LAB — GLUCOSE, CAPILLARY: Glucose-Capillary: 121 mg/dL — ABNORMAL HIGH (ref 70–99)

## 2012-02-24 MED ORDER — HYDROCODONE-ACETAMINOPHEN 5-325 MG PO TABS: 1.0000 | ORAL_TABLET | Freq: Four times a day (QID) | ORAL | Status: AC | PRN

## 2012-02-24 NOTE — Progress Notes (Signed)
Discharge Summary: a/o.vss. Saline lock removed. Up ad lib. No complaints of pain. Discharge instructions given. Prescriptions given. Pt verbalized understanding of instructions. Left floor via wheelchair with nursing staff and family member.

## 2012-02-24 NOTE — Discharge Summary (Signed)
Physician Discharge Summary  Patient ID: Justin [name] MRN: [id number] DOB/AGE: [birth year] 73 y.o. Primary Care Physician:KNOWLTON,STEPHEN D, MD Admit date: 02/22/2012 Discharge date: 02/24/2012    Discharge Diagnoses:  Left leg deep subcuticular hematoma Atrial fibrillation DM type  II hypertension   Active Problems:  * No active hospital problems. *    Medication List  As of 02/24/2012  8:04 AM   TAKE these medications         CINNAMON PO   Take 1,000 mg by mouth daily.      doxazosin 8 MG tablet   Commonly known as: CARDURA   Take 8 mg by mouth at bedtime.      ferrous sulfate 325 (65 FE) MG tablet   Take 325 mg by mouth daily with breakfast.      gemfibrozil 600 MG tablet   Commonly known as: LOPID   Take 600 mg by mouth 2 (two) times daily before a meal.      glipiZIDE 10 MG tablet   Commonly known as: GLUCOTROL   Take 10 mg by mouth daily.      HYDROcodone-acetaminophen 5-325 MG per tablet   Commonly known as: NORCO/VICODIN   Take 1-2 tablets by mouth every 6 (six) hours as needed.      metFORMIN 1000 MG tablet   Commonly known as: GLUCOPHAGE   Take 1,000 mg by mouth 2 (two) times daily.      PROAIR HFA 108 (90 BASE) MCG/ACT inhaler   Generic drug: albuterol   Inhale 2 puffs into the lungs every 6 (six) hours as needed. For shortness of breath      vitamin B-12 500 MCG tablet   Commonly known as: CYANOCOBALAMIN   Take 1,000 mcg by mouth daily.      warfarin 5 MG tablet   Commonly known as: COUMADIN   Take 5-7.5 mg by mouth daily. Takes 7.5 mg on Tuesdays and Fridays  Takes 5 mg on all other days            Discharged Condition:stable    Consults: general surgery  Significant Diagnostic Studies: US Venous Img Lower Unilateral Left  02/22/2012  *RADIOLOGY REPORT*  Clinical Data: Left calf protrusion.  LEFT LOWER EXTREMITY VENOUS DUPLEX ULTRASOUND  Technique:  Gray-scale sonography with graded compression, as well as color Doppler and duplex  ultrasound, were performed to evaluate the deep venous system of the lower extremity from the level of the common femoral vein through the popliteal and proximal calf veins. Spectral Doppler was utilized to evaluate flow at rest and with distal augmentation maneuvers.  Comparison:  None.  Findings: Deep veins of the left lower extremity are normally patent and without evidence of thrombus.  There is a complex fluid collection in the posterior mid calf measuring roughly 4 x 2 x 4 cm.  This may represent hematoma versus abscess given appearance. This is not a typical location for a Baker's cyst.  IMPRESSION: No evidence of left lower extremity DVT.  4 cm complex fluid collection of the mid calf having appearance of either hematoma or abscess.   Original Report Authenticated By: Reola Calkins, M.D.     Lab Results: Basic Metabolic Panel:  Basename 02/22/12 1546  NA 140  K 4.1  CL 104  CO2 26  GLUCOSE 79  BUN 14  CREATININE 0.81  CALCIUM 10.4  MG --  PHOS --   Liver Function Tests: No results found for this basename: AST:2,ALT:2,ALKPHOS:2,BILITOT:2,PROT:2,ALBUMIN:2 in the last 72  hours   CBC:  Basename 02/22/12 1546  WBC 7.8  NEUTROABS 4.0  HGB 14.4  HCT 43.2  MCV 93.3  PLT 320    No results found for this or any previous visit (from the past 240 hour(s)).   Hospital Course:  This is a 71 years old male patient with history of multiple medical illness was admitted due to pain swelling of the lt leg. He was intimally admitted as case of cellulitis and abscess. He was started on combinations Iv antibiotics.  Surgical consult was done. According to the surgery evaluation it doesn't seem to be an abscess. It is diagnosed as hematoma and advised to D/c antibiotics and continue analgesics and warm compress. His antibiotics discontinued and he will be discharged on pain medications.  Discharge Exam: Blood pressure 141/80, pulse 91, temperature 97.9 F (36.6 C), temperature source  Oral, resp. rate 18, height 5\' 9"  (1.753 m), weight 97 kg (213 lb 13.5 oz), SpO2 95.00%.   Disposition: Home      Signed: Catrena Vari  02/24/2012, 8:04 AM

## 2012-03-24 ENCOUNTER — Inpatient Hospital Stay (HOSPITAL_COMMUNITY)
Admission: EM | Admit: 2012-03-24 | Discharge: 2012-03-27 | DRG: 603 | Disposition: A | Discharge status: Home / Self Care | Payer: Medicare Other | Attending: Internal Medicine | Admitting: Internal Medicine

## 2012-03-24 ENCOUNTER — Encounter (HOSPITAL_COMMUNITY): Payer: Self-pay

## 2012-03-24 DIAGNOSIS — E538 Deficiency of other specified B group vitamins: Secondary | ICD-10-CM | POA: Diagnosis present

## 2012-03-24 DIAGNOSIS — Z79899 Other long term (current) drug therapy: Secondary | ICD-10-CM

## 2012-03-24 DIAGNOSIS — Z888 Allergy status to other drugs, medicaments and biological substances status: Secondary | ICD-10-CM

## 2012-03-24 DIAGNOSIS — E119 Type 2 diabetes mellitus without complications: Secondary | ICD-10-CM

## 2012-03-24 DIAGNOSIS — L02419 Cutaneous abscess of limb, unspecified: Principal | ICD-10-CM | POA: Diagnosis present

## 2012-03-24 DIAGNOSIS — S99929A Unspecified injury of unspecified foot, initial encounter: Secondary | ICD-10-CM | POA: Diagnosis present

## 2012-03-24 DIAGNOSIS — E785 Hyperlipidemia, unspecified: Secondary | ICD-10-CM | POA: Diagnosis present

## 2012-03-24 DIAGNOSIS — Z7901 Long term (current) use of anticoagulants: Secondary | ICD-10-CM

## 2012-03-24 DIAGNOSIS — S8990XA Unspecified injury of unspecified lower leg, initial encounter: Secondary | ICD-10-CM | POA: Diagnosis present

## 2012-03-24 DIAGNOSIS — I4821 Permanent atrial fibrillation: Secondary | ICD-10-CM | POA: Diagnosis present

## 2012-03-24 DIAGNOSIS — Z87440 Personal history of urinary (tract) infections: Secondary | ICD-10-CM

## 2012-03-24 DIAGNOSIS — L0390 Cellulitis, unspecified: Secondary | ICD-10-CM | POA: Diagnosis present

## 2012-03-24 DIAGNOSIS — Y929 Unspecified place or not applicable: Secondary | ICD-10-CM

## 2012-03-24 DIAGNOSIS — K219 Gastro-esophageal reflux disease without esophagitis: Secondary | ICD-10-CM | POA: Diagnosis present

## 2012-03-24 DIAGNOSIS — D509 Iron deficiency anemia, unspecified: Secondary | ICD-10-CM | POA: Diagnosis present

## 2012-03-24 DIAGNOSIS — S99919A Unspecified injury of unspecified ankle, initial encounter: Secondary | ICD-10-CM | POA: Diagnosis present

## 2012-03-24 DIAGNOSIS — I10 Essential (primary) hypertension: Secondary | ICD-10-CM | POA: Diagnosis present

## 2012-03-24 DIAGNOSIS — Z792 Long term (current) use of antibiotics: Secondary | ICD-10-CM

## 2012-03-24 DIAGNOSIS — I4891 Unspecified atrial fibrillation: Secondary | ICD-10-CM

## 2012-03-24 DIAGNOSIS — R339 Retention of urine, unspecified: Secondary | ICD-10-CM

## 2012-03-24 DIAGNOSIS — X58XXXA Exposure to other specified factors, initial encounter: Secondary | ICD-10-CM | POA: Diagnosis present

## 2012-03-24 DIAGNOSIS — Z9089 Acquired absence of other organs: Secondary | ICD-10-CM

## 2012-03-24 DIAGNOSIS — N401 Enlarged prostate with lower urinary tract symptoms: Secondary | ICD-10-CM | POA: Diagnosis present

## 2012-03-24 DIAGNOSIS — N138 Other obstructive and reflux uropathy: Secondary | ICD-10-CM | POA: Diagnosis present

## 2012-03-24 DIAGNOSIS — L03119 Cellulitis of unspecified part of limb: Secondary | ICD-10-CM

## 2012-03-24 HISTORY — DX: Cellulitis of left lower limb: L03.116

## 2012-03-24 LAB — CBC WITH DIFFERENTIAL/PLATELET
Basophils Absolute: 0.1 10*3/uL (ref 0.0–0.1)
Eosinophils Relative: 3 % (ref 0–5)
Lymphocytes Relative: 42 % (ref 12–46)
MCV: 92.2 fL (ref 78.0–100.0)
Neutrophils Relative %: 36 % — ABNORMAL LOW (ref 43–77)
Platelets: 354 10*3/uL (ref 150–400)
RBC: 4.62 MIL/uL (ref 4.22–5.81)
RDW: 14.5 % (ref 11.5–15.5)
WBC: 5.5 10*3/uL (ref 4.0–10.5)

## 2012-03-24 LAB — POCT I-STAT, CHEM 8
BUN: 19 mg/dL (ref 6–23)
Calcium, Ion: 1.22 mmol/L (ref 1.13–1.30)
Chloride: 105 mEq/L (ref 96–112)
Glucose, Bld: 68 mg/dL — ABNORMAL LOW (ref 70–99)
HCT: 46 % (ref 39.0–52.0)

## 2012-03-24 LAB — URINALYSIS, MICROSCOPIC ONLY
Bilirubin Urine: NEGATIVE
Leukocytes, UA: NEGATIVE
Nitrite: NEGATIVE
Specific Gravity, Urine: 1.015 (ref 1.005–1.030)
Urine-Other: NONE SEEN
Urobilinogen, UA: 0.2 mg/dL (ref 0.0–1.0)
pH: 6 (ref 5.0–8.0)

## 2012-03-24 LAB — GLUCOSE, CAPILLARY: Glucose-Capillary: 100 mg/dL — ABNORMAL HIGH (ref 70–99)

## 2012-03-24 LAB — LACTIC ACID, PLASMA: Lactic Acid, Venous: 1.8 mmol/L (ref 0.5–2.2)

## 2012-03-24 MED ORDER — ACETAMINOPHEN 325 MG PO TABS: 650.0000 mg | ORAL_TABLET | Freq: Four times a day (QID) | ORAL | Status: DC | PRN

## 2012-03-24 MED ORDER — GEMFIBROZIL 600 MG PO TABS
600.0000 mg | ORAL_TABLET | Freq: Two times a day (BID) | ORAL | Status: DC
Start: 2012-03-25 — End: 2012-03-27
  Administered 2012-03-25 – 2012-03-27 (×5): 600 mg via ORAL
  Filled 2012-03-24 (×8): qty 1

## 2012-03-24 MED ORDER — DOCUSATE SODIUM 100 MG PO CAPS
100.0000 mg | ORAL_CAPSULE | Freq: Two times a day (BID) | ORAL | Status: DC
  Administered 2012-03-25 – 2012-03-27 (×5): 100 mg via ORAL
  Filled 2012-03-24 (×6): qty 1

## 2012-03-24 MED ORDER — SENNA 8.6 MG PO TABS
1.0000 | ORAL_TABLET | Freq: Two times a day (BID) | ORAL | Status: DC
  Administered 2012-03-25 – 2012-03-27 (×3): 8.6 mg via ORAL
  Filled 2012-03-24 (×3): qty 1

## 2012-03-24 MED ORDER — ACETAMINOPHEN 650 MG RE SUPP: 650.0000 mg | Freq: Four times a day (QID) | RECTAL | Status: DC | PRN

## 2012-03-24 MED ORDER — ONDANSETRON HCL 4 MG/2ML IJ SOLN: 4.0000 mg | Freq: Three times a day (TID) | INTRAMUSCULAR | Status: AC | PRN

## 2012-03-24 MED ORDER — LIDOCAINE HCL 2 % EX GEL
Freq: Once | CUTANEOUS | Status: AC
  Administered 2012-03-24: 10 via URETHRAL
  Filled 2012-03-24: qty 10

## 2012-03-24 MED ORDER — ONDANSETRON HCL 4 MG/2ML IJ SOLN: 4.0000 mg | Freq: Four times a day (QID) | INTRAMUSCULAR | Status: DC | PRN

## 2012-03-24 MED ORDER — INSULIN ASPART 100 UNIT/ML ~~LOC~~ SOLN: 0.0000 [IU] | Freq: Every day | SUBCUTANEOUS | Status: DC

## 2012-03-24 MED ORDER — SODIUM CHLORIDE 0.9 % IV SOLN
INTRAVENOUS | Status: DC
  Administered 2012-03-25 (×2): via INTRAVENOUS

## 2012-03-24 MED ORDER — ALBUTEROL SULFATE HFA 108 (90 BASE) MCG/ACT IN AERS: 2.0000 | INHALATION_SPRAY | Freq: Four times a day (QID) | RESPIRATORY_TRACT | Status: DC | PRN

## 2012-03-24 MED ORDER — METFORMIN HCL 500 MG PO TABS
1000.0000 mg | ORAL_TABLET | Freq: Two times a day (BID) | ORAL | Status: DC
  Administered 2012-03-25: 1000 mg via ORAL
  Filled 2012-03-24 (×3): qty 2

## 2012-03-24 MED ORDER — ONDANSETRON HCL 4 MG PO TABS: 4.0000 mg | ORAL_TABLET | Freq: Four times a day (QID) | ORAL | Status: DC | PRN

## 2012-03-24 MED ORDER — INSULIN ASPART 100 UNIT/ML ~~LOC~~ SOLN
0.0000 [IU] | Freq: Three times a day (TID) | SUBCUTANEOUS | Status: DC
  Administered 2012-03-25 – 2012-03-27 (×4): 1 [IU] via SUBCUTANEOUS

## 2012-03-24 MED ORDER — HYDROMORPHONE HCL PF 1 MG/ML IJ SOLN: 1.0000 mg | INTRAMUSCULAR | Status: AC | PRN

## 2012-03-24 MED ORDER — HYDROCODONE-ACETAMINOPHEN 5-325 MG PO TABS: 1.0000 | ORAL_TABLET | Freq: Four times a day (QID) | ORAL | Status: DC | PRN

## 2012-03-24 MED ORDER — TAMSULOSIN HCL 0.4 MG PO CAPS
0.4000 mg | ORAL_CAPSULE | Freq: Every day | ORAL | Status: DC
  Administered 2012-03-25 – 2012-03-27 (×3): 0.4 mg via ORAL
  Filled 2012-03-24 (×3): qty 1

## 2012-03-24 NOTE — ED Notes (Signed)
Pt family member reports that cellulitis started 4 weeks prior, started as hematoma. Pt reports urinary retention since Sunday night. Family member reports he was able to void only a little at a time and had urinary frequency.
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
## 2012-03-24 NOTE — ED Notes (Signed)
Cellulitis to LLE x 3 weeks-on antibiotics by EDP then by PMD but getting worse.  Also having urinary retention.  He is able to void but just a little at a time, and frequently.

## 2012-03-24 NOTE — ED Notes (Signed)
Bladder scan showed 
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
## 2012-03-25 ENCOUNTER — Inpatient Hospital Stay (HOSPITAL_COMMUNITY): Payer: Medicare Other

## 2012-03-25 ENCOUNTER — Encounter (HOSPITAL_COMMUNITY): Payer: Self-pay | Admitting: Oncology

## 2012-03-25 DIAGNOSIS — L02419 Cutaneous abscess of limb, unspecified: Secondary | ICD-10-CM

## 2012-03-25 DIAGNOSIS — M7989 Other specified soft tissue disorders: Secondary | ICD-10-CM

## 2012-03-25 DIAGNOSIS — I10 Essential (primary) hypertension: Secondary | ICD-10-CM | POA: Diagnosis present

## 2012-03-25 DIAGNOSIS — E119 Type 2 diabetes mellitus without complications: Secondary | ICD-10-CM

## 2012-03-25 DIAGNOSIS — M79609 Pain in unspecified limb: Secondary | ICD-10-CM

## 2012-03-25 DIAGNOSIS — L03119 Cellulitis of unspecified part of limb: Secondary | ICD-10-CM

## 2012-03-25 DIAGNOSIS — I4821 Permanent atrial fibrillation: Secondary | ICD-10-CM | POA: Diagnosis present

## 2012-03-25 LAB — GLUCOSE, CAPILLARY
Glucose-Capillary: 132 mg/dL — ABNORMAL HIGH (ref 70–99)
Glucose-Capillary: 105 mg/dL — ABNORMAL HIGH (ref 70–99)
Glucose-Capillary: 156 mg/dL — ABNORMAL HIGH (ref 70–99)

## 2012-03-25 LAB — BASIC METABOLIC PANEL
BUN: 16 mg/dL (ref 6–23)
Creatinine, Ser: 1 mg/dL (ref 0.50–1.35)
GFR calc Af Amer: 84 mL/min — ABNORMAL LOW (ref >90)
GFR calc non Af Amer: 73 mL/min — ABNORMAL LOW (ref >90)
Glucose, Bld: 109 mg/dL — ABNORMAL HIGH (ref 70–99)
Potassium: 3.8 mEq/L (ref 3.5–5.1)

## 2012-03-25 LAB — PROTIME-INR: INR: 2.09 — ABNORMAL HIGH (ref 0.00–1.49)

## 2012-03-25 LAB — CBC
Hemoglobin: 14.1 g/dL (ref 13.0–17.0)
MCH: 31.1 pg (ref 26.0–34.0)
MCHC: 33.6 g/dL (ref 30.0–36.0)
RDW: 14.6 % (ref 11.5–15.5)

## 2012-03-25 MED ORDER — PANTOPRAZOLE SODIUM 40 MG PO TBEC
40.0000 mg | DELAYED_RELEASE_TABLET | Freq: Every day | ORAL | Status: DC
  Administered 2012-03-26 – 2012-03-27 (×2): 40 mg via ORAL
  Filled 2012-03-25 (×4): qty 1

## 2012-03-25 MED ORDER — SALINE SPRAY 0.65 % NA SOLN
1.0000 | NASAL | Status: DC | PRN
  Administered 2012-03-25: 1 via NASAL
  Filled 2012-03-25: qty 44

## 2012-03-25 MED ORDER — WARFARIN SODIUM 5 MG PO TABS
5.0000 mg | ORAL_TABLET | Freq: Once | ORAL | Status: AC
  Administered 2012-03-25: 5 mg via ORAL
  Filled 2012-03-25: qty 1

## 2012-03-25 MED ORDER — VANCOMYCIN HCL IN DEXTROSE 1-5 GM/200ML-% IV SOLN
1000.0000 mg | Freq: Two times a day (BID) | INTRAVENOUS | Status: DC
  Administered 2012-03-25 – 2012-03-26 (×5): 1000 mg via INTRAVENOUS
  Filled 2012-03-25 (×7): qty 200

## 2012-03-25 MED ORDER — PHENAZOPYRIDINE HCL 100 MG PO TABS
100.0000 mg | ORAL_TABLET | Freq: Three times a day (TID) | ORAL | Status: DC
  Administered 2012-03-25 – 2012-03-26 (×5): 100 mg via ORAL
  Filled 2012-03-25 (×9): qty 1

## 2012-03-25 MED ORDER — WARFARIN - PHARMACIST DOSING INPATIENT: Freq: Every day | Status: DC

## 2012-03-25 MED ORDER — BISACODYL 5 MG PO TBEC
10.0000 mg | DELAYED_RELEASE_TABLET | Freq: Every day | ORAL | Status: DC | PRN
  Administered 2012-03-25: 10 mg via ORAL
  Filled 2012-03-25: qty 2

## 2012-03-25 NOTE — Progress Notes (Signed)
ANTIBIOTIC CONSULT NOTE - INITIAL  Pharmacy Consult for Vancomycin Indication: LLE cellulitis  Allergies  Allergen Reactions  . Niacin And Related     Unknown     Patient Measurements: Height: 6' (182.9 cm) Weight: 204 lb 12.9 oz (92.9 kg) IBW/kg (Calculated) : 77.6    Vital Signs: Temp: 98 F (36.7 C) (09/27 2251) Temp src: Oral (09/27 2251) BP: 131/74 mmHg (09/27 2251) Pulse Rate: 70  (09/27 2251) Intake/Output from previous day: 09/27 0701 - 09/28 0700 In: -  Out: 1200 [Urine:1200] Intake/Output from this shift: Total I/O In: -  Out: 1200 [Urine:1200]  Labs:  [facility] 03/24/12 1942 03/24/12 1931  WBC -- 5.5  HGB 15.6 13.9  PLT -- 354  LABCREA -- --  CREATININE 1.10 --   Estimated Creatinine Clearance: 65.6 ml/min (by C-G formula based on Cr of 1.1). No results found for this basename: VANCOTROUGH:2,VANCOPEAK:2,VANCORANDOM:2,GENTTROUGH:2,GENTPEAK:2,GENTRANDOM:2,TOBRATROUGH:2,TOBRAPEAK:2,TOBRARND:2,AMIKACINPEAK:2,AMIKACINTROU:2,AMIKACIN:2, in the last 72 hours   Microbiology: No results found for this or any previous visit (from the past 720 hour(s)).  Medical History: Past Medical History  Diagnosis Date  . Hypertension   . Diabetes mellitus   . IDA (iron deficiency anemia)   . Vitamin B 12 deficiency   . Hyperlipidemia   . HTN (hypertension)   . Epistaxis   . Atrial fibrillation     Sees Dr Tonia Ghent, pt/wife unsure but believe this is why he is on coumadin  . PFO (patent foramen ovale)   . Cellulitis of left lower leg     Medications:  Scheduled:    . docusate sodium  100 mg Oral BID  . gemfibrozil  600 mg Oral BID AC  . insulin aspart  0-5 Units Subcutaneous QHS  . insulin aspart  0-9 Units Subcutaneous TID WC  . lidocaine   Urethral Once  . metFORMIN  1,000 mg Oral BID  . senna  1 tablet Oral BID  . Tamsulosin HCl  0.4 mg Oral Daily  . vancomycin  1,000 mg Intravenous Q12H   Infusions:    . sodium chloride 50 mL/hr at 03/25/12 0009    Assessment: 71 yo male with DM, s/p left leg injury that developed into cellulits with small hematoma.  Failed outpt therapy with Doxy- MD ordering Vancomycin per RX for LLE cellulitis.  Goal of Therapy:  Vancomycin trough level 10-15 mcg/ml  Plan:   Vancomycin 1gm IV q12h. CrCl~61 (N)  F/U SCr/levels.  Lorenza Evangelist 03/25/2012 12:32 AM   Lorenza Evangelist 03/25/2012,12:28 AM

## 2012-03-25 NOTE — Progress Notes (Signed)
[facility] PROGRESS NOTE  Justin [name] RUE:[id number] DOB: 01-28-1941 DOA: 03/24/2012 PCP: Milana Obey, MD  Assessment/Plan: Active Problems:  Cellulitis  Diabetes mellitus  Urinary retention  #1 left lower extremity cellulitis Lower extremity Doppler was negative for DVT or Baker's cyst preliminary reading. Continue IV vancomycin. Will check plain films of the left lower extremity. Follow.  #2 urinary retention Patient has a Foley catheter was placed on admission. Continue Foley catheter. Continue Flomax. Will likely need a voiding trial in about 2-3 days. Urology has been consulted and will see the patient. Pyridium for bladder spasms.  #3 atrial fibrillation Currently rate controlled. Coumadin for anticoagulations.  #4 type 2 diabetes Hemoglobin A1c is pending. Will DC Glucophage. Continue sliding scale insulin.  #5 hypertension Stable.  #6 questionable prostatitis Urinalysis is negative. Patient currently on IV vancomycin. Follow.  #7 prophylaxis Patient on Coumadin for DVT prophylaxis.   Code Status: Full Family Communication: Updated patient and her family at bedside Disposition Plan: Home when medically stable   Brief narrative: Justin [name] is a 71 y.o. male with prior h/o of HTN, DM had an injury 3 weeks ago on his Left leg, which developed into cellulitis with a small hematoma. He was treated with doxycycline as outpatient, but his cellulitis has't improved. He was also recently treated for prostatitis with oral antibiotics. He went to see his PCP today at his clinic and was sent to [facility] long for worsening cellulitis and urinary retention. A foley catheter was placed and of urine was drained. Pt doesn't have any specific complaints at this time. He is being admitted to the hospital for IV antibiotics. Dr Sim Boast from Urology service was already contacted for urinary retention.    Consultants:  Urology pending  Procedures:  LE doppler  03/25/12  Antibiotics:  IV Vancomycin 03/25/12  HPI/Subjective: Patient with some bladder spasms per nurse earlier on.  Objective: Filed Vitals:   03/24/12 2100 03/24/12 2148 03/24/12 2251 03/25/12 0425  BP:  135/85 131/74 128/90  Pulse: 79 88 70 77  Temp:   98 F (36.7 C) 98.2 F (36.8 C)  TempSrc:   Oral Oral  Resp: 19 22 18 18   Height:   6' (1.829 m)   Weight:   92.9 kg (204 lb 12.9 oz)   SpO2: 95% 96% 98% 98%    Intake/Output Summary (Last 24 hours) at 03/25/12 1222 Last data filed at 03/25/12 1053  Gross per 24 hour  Intake 313.33 ml  Output   3550 ml  Net -3236.67 ml   Filed Weights   03/24/12 2251  Weight: 92.9 kg (204 lb 12.9 oz)    Exam:   General:  NAD   Cardiovascular: RRR  Respiratory: CTAB  Abdomen: Soft/NT/ND/+BS  Data Reviewed: Basic Metabolic Panel:  Lab 03/25/12 1478 03/24/12 1942  NA 138 142  K 3.8 4.2  CL 103 105  CO2 24 --  GLUCOSE 109* 68*  BUN 16 19  CREATININE 1.00 1.10  CALCIUM 9.6 --  MG -- --  PHOS -- --   Liver Function Tests: No results found for this basename: AST:5,ALT:5,ALKPHOS:5,BILITOT:5,PROT:5,ALBUMIN:5 in the last 168 hours No results found for this basename: LIPASE:5,AMYLASE:5 in the last 168 hours No results found for this basename: AMMONIA:5 in the last 168 hours CBC:  Lab 03/25/12 0354 03/24/12 1942 03/24/12 1931  WBC 8.6 -- 5.5  NEUTROABS -- -- 2.0  HGB 14.1 15.6 13.9  HCT 42.0 46.0 42.6  MCV 92.7 -- 92.2  PLT  352 -- 354   Cardiac Enzymes: No results found for this basename: CKTOTAL:5,CKMB:5,CKMBINDEX:5,TROPONINI:5 in the last 168 hours BNP (last 3 results) No results found for this basename: PROBNP:3 in the last 8760 hours CBG:  Lab 03/25/12 1149 03/25/12 0804 03/24/12 2219  GLUCAP 132* 96 100*    No results found for this or any previous visit (from the past 240 hour(s)).   Studies: No results found.  Scheduled Meds:   . docusate sodium  100 mg Oral BID  . gemfibrozil  600 mg Oral BID  AC  . insulin aspart  0-5 Units Subcutaneous QHS  . insulin aspart  0-9 Units Subcutaneous TID WC  . lidocaine   Urethral Once  . metFORMIN  1,000 mg Oral BID  . phenazopyridine  100 mg Oral TID WC  . senna  1 tablet Oral BID  . Tamsulosin HCl  0.4 mg Oral Daily  . vancomycin  1,000 mg Intravenous Q12H  . warfarin  5 mg Oral ONCE-1800  . Warfarin - Pharmacist Dosing Inpatient   Does not apply q1800   Continuous Infusions:   . sodium chloride 50 mL/hr at 03/25/12 0009    Active Problems:  Cellulitis  Diabetes mellitus  Urinary retention    Time spent: > 30 mins    [facility]  [facility] Pager [phone]. If 8PM-8AM, please contact night-coverage at [email or web address], password [facility] 03/25/2012, 12:22 PM  LOS: 1 day

## 2012-03-25 NOTE — Progress Notes (Signed)
VASCULAR LAB PRELIMINARY  PRELIMINARY  PRELIMINARY  PRELIMINARY  Left lower extremity venous Doppler completed.    Preliminary report:  There is no DVT or SVT noted in the left lower extremity.  Justin [name], 03/25/2012, 11:10 AM

## 2012-03-25 NOTE — Progress Notes (Signed)
ANTICOAGULATION CONSULT NOTE - Follow Up Consult  Pharmacy Consult for Warfarin Indication: atrial fibrillation  Allergies  Allergen Reactions  . Niacin And Related     Unknown     Patient Measurements: Height: 6' (182.9 cm) Weight: 204 lb 12.9 oz (92.9 kg) IBW/kg (Calculated) : 77.6   Vital Signs: Temp: 98.2 F (36.8 C) (09/28 0425) Temp src: Oral (09/28 0425) BP: 128/90 mmHg (09/28 0425) Pulse Rate: 77  (09/28 0425)  Labs:  Justin [name] 03/25/12 0359 03/25/12 0354 03/24/12 1942 03/24/12 1931  HGB -- 14.1 15.6 --  HCT -- 42.0 46.0 42.6  PLT -- 352 -- 354  APTT -- -- -- --  LABPROT 22.6* -- -- --  INR 2.09* -- -- --  HEPARINUNFRC -- -- -- --  CREATININE -- 1.00 1.10 --  CKTOTAL -- -- -- --  CKMB -- -- -- --  TROPONINI -- -- -- --    Estimated Creatinine Clearance: 72.2 ml/min (by C-G formula based on Cr of 1).   Medications:  Scheduled:    . docusate sodium  100 mg Oral BID  . gemfibrozil  600 mg Oral BID AC  . insulin aspart  0-5 Units Subcutaneous QHS  . insulin aspart  0-9 Units Subcutaneous TID WC  . lidocaine   Urethral Once  . metFORMIN  1,000 mg Oral BID  . phenazopyridine  100 mg Oral TID WC  . senna  1 tablet Oral BID  . Tamsulosin HCl  0.4 mg Oral Daily  . vancomycin  1,000 mg Intravenous Q12H   Infusions:    . sodium chloride 50 mL/hr at 03/25/12 0009    Assessment:  71 YOM admit on chronic warfarin for hx of Afib.  PTA warfarin dose was 5mg  PO daily, last dose 9/26  INR therapeutic at 2.09  Goal of Therapy:  INR 2-3    Plan:   Warfarin 5mg  PO today x1  Daily INR, CBC   Lynann Beaver PharmD, BCPS Pager [phone] 03/25/2012 11:43 AM

## 2012-03-25 NOTE — Progress Notes (Signed)
ANTICOAGULATION CONSULT NOTE - Initial Consult  Pharmacy Consult for Warfarin Indication: atrial fibrillation  Allergies  Allergen Reactions  . Niacin And Related     Unknown     Patient Measurements: Height: 6' (182.9 cm) Weight: 204 lb 12.9 oz (92.9 kg) IBW/kg (Calculated) : 77.6    Vital Signs: Temp: 98.2 F (36.8 C) (09/28 0425) Temp src: Oral (09/28 0425) BP: 128/90 mmHg (09/28 0425) Pulse Rate: 77  (09/28 0425)  Labs:  Alvira Philips 03/25/12 0359 03/25/12 0354 03/24/12 1942 03/24/12 1931  HGB -- 14.1 15.6 --  HCT -- 42.0 46.0 42.6  PLT -- 352 -- 354  APTT -- -- -- --  LABPROT 22.6* -- -- --  INR 2.09* -- -- --  HEPARINUNFRC -- -- -- --  CREATININE -- 1.00 1.10 --  CKTOTAL -- -- -- --  CKMB -- -- -- --  TROPONINI -- -- -- --    Estimated Creatinine Clearance: 72.2 ml/min (by C-G formula based on Cr of 1).   Medical History: Past Medical History  Diagnosis Date  . Hypertension   . Diabetes mellitus   . IDA (iron deficiency anemia)   . Vitamin B 12 deficiency   . Hyperlipidemia   . HTN (hypertension)   . Epistaxis   . Atrial fibrillation     Sees Dr Tonia Ghent, pt/wife unsure but believe this is why he is on coumadin  . PFO (patent foramen ovale)   . Cellulitis of left lower leg     Medications:  Scheduled:    . docusate sodium  100 mg Oral BID  . gemfibrozil  600 mg Oral BID AC  . insulin aspart  0-5 Units Subcutaneous QHS  . insulin aspart  0-9 Units Subcutaneous TID WC  . lidocaine   Urethral Once  . metFORMIN  1,000 mg Oral BID  . senna  1 tablet Oral BID  . Tamsulosin HCl  0.4 mg Oral Daily  . vancomycin  1,000 mg Intravenous Q12H   Infusions:    . sodium chloride 50 mL/hr at 03/25/12 0009    Assessment: 71 yo admitted with LLE cellulitis on chronic coumadin for A-fib.  Pt takes 5mg  daily. Goal of Therapy:  INR 2-3    Plan:   Daily PT/INR.  Education.  Susanne Greenhouse R 03/25/2012,5:37 AM
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
## 2012-03-26 DIAGNOSIS — L02419 Cutaneous abscess of limb, unspecified: Principal | ICD-10-CM

## 2012-03-26 DIAGNOSIS — I4891 Unspecified atrial fibrillation: Secondary | ICD-10-CM

## 2012-03-26 DIAGNOSIS — E119 Type 2 diabetes mellitus without complications: Secondary | ICD-10-CM

## 2012-03-26 DIAGNOSIS — R339 Retention of urine, unspecified: Secondary | ICD-10-CM

## 2012-03-26 LAB — BASIC METABOLIC PANEL
Calcium: 9 mg/dL (ref 8.4–10.5)
Chloride: 105 mEq/L (ref 96–112)
Creatinine, Ser: 0.82 mg/dL (ref 0.50–1.35)
GFR calc Af Amer: >90 mL/min (ref >90)
Sodium: 138 mEq/L (ref 135–145)

## 2012-03-26 LAB — GLUCOSE, CAPILLARY
Glucose-Capillary: 132 mg/dL — ABNORMAL HIGH (ref 70–99)
Glucose-Capillary: 176 mg/dL — ABNORMAL HIGH (ref 70–99)

## 2012-03-26 LAB — CBC
MCH: 30.1 pg (ref 26.0–34.0)
MCV: 92.3 fL (ref 78.0–100.0)
Platelets: 360 10*3/uL (ref 150–400)
RDW: 14.5 % (ref 11.5–15.5)
WBC: 6.6 10*3/uL (ref 4.0–10.5)

## 2012-03-26 LAB — URINE CULTURE

## 2012-03-26 LAB — PROTIME-INR: Prothrombin Time: 18.1 seconds — ABNORMAL HIGH (ref 11.6–15.2)

## 2012-03-26 MED ORDER — DILTIAZEM HCL ER COATED BEADS 240 MG PO CP24
240.0000 mg | ORAL_CAPSULE | Freq: Every day | ORAL | Status: DC
  Administered 2012-03-26 – 2012-03-27 (×2): 240 mg via ORAL
  Filled 2012-03-26 (×2): qty 1

## 2012-03-26 MED ORDER — WARFARIN SODIUM 7.5 MG PO TABS
7.5000 mg | ORAL_TABLET | Freq: Once | ORAL | Status: AC
  Administered 2012-03-26: 7.5 mg via ORAL
  Filled 2012-03-26: qty 1

## 2012-03-26 MED ORDER — POTASSIUM CHLORIDE CRYS ER 20 MEQ PO TBCR
40.0000 meq | EXTENDED_RELEASE_TABLET | Freq: Once | ORAL | Status: AC
  Administered 2012-03-26: 40 meq via ORAL
  Filled 2012-03-26: qty 2

## 2012-03-26 NOTE — Progress Notes (Signed)
ANTIBIOTIC CONSULT NOTE - FOLLOW UP  Pharmacy Consult for Vancomycin Indication: Cellulitis   Allergies  Allergen Reactions  . Niacin And Related     Unknown     Patient Measurements: Height: 6' (182.9 cm) Weight: 204 lb 12.9 oz (92.9 kg) IBW/kg (Calculated) : 77.6   Vital Signs: Temp: 98 F (36.7 C) (09/29 1300) BP: 117/61 mmHg (09/29 1300) Pulse Rate: 71  (09/29 1300) Intake/Output from previous day: 09/28 0701 - 09/29 0700 In: 1958 [P.O.:480; I.V.:1478] Out: 4325 [Urine:4325] Intake/Output from this shift: Total I/O In: 240 [P.O.:240] Out: -   Labs:  Basename 03/26/12 0421 03/25/12 0354 03/24/12 1942 03/24/12 1931  WBC 6.6 8.6 -- 5.5  HGB 14.1 14.1 15.6 --  PLT 360 352 -- 354  LABCREA -- -- -- --  CREATININE 0.82 1.00 1.10 --   Estimated Creatinine Clearance: 88.1 ml/min (by C-G formula based on Cr of 0.82).  Basename 03/26/12 2052  VANCOTROUGH 14.9  VANCOPEAK --  VANCORANDOM --  GENTTROUGH --  GENTPEAK --  GENTRANDOM --  TOBRATROUGH --  TOBRAPEAK --  TOBRARND --  AMIKACINPEAK --  AMIKACINTROU --  AMIKACIN --    Assessment:  71 yom on D#2 Vancomycin 1gm IV q12h for LLE cellulitis.  Per urology, unlikely prostatitis (?BPH).    Vancomycin trough tonight therapeutic at 14.9  Afebrile, WBC wnl, Scr stable   Blood cx pending, urine culture negative.  Goal of Therapy:  Vancomycin trough level 10-15 mcg/ml  Plan:   Continue Vancomycin 1gm IV q12h  Pharmacy will follow up  Geoffry Paradise Thi 03/26/2012,9:52 PM

## 2012-03-26 NOTE — Progress Notes (Signed)
[facility] PROGRESS NOTE  Justin [name] ZOX:[id number] DOB: Jul 26, 1940 DOA: 03/24/2012 PCP: Milana Obey, MD  Assessment/Plan: Principal Problem:  *Cellulitis Active Problems:  ANEMIA, IRON DEFICIENCY  GASTROESOPHAGEAL REFLUX DISEASE, CHRONIC  Diabetes mellitus  Urinary retention  Atrial fibrillation  HTN (hypertension)  #1 left lower extremity cellulitis Clinical improvement. Lower extremity Doppler was negative for DVT or Baker's cyst preliminary reading. Continue IV vancomycin. Plain films of the left lower extremity are negative. Follow.  #2 urinary retention Patient has a Foley catheter was placed on admission. Continue Foley catheter. Continue Flomax. Will likely need a voiding trial in about 2-3 days. Urology is following and appreciate input and recommendations. Pyridium for bladder spasms.  #3 atrial fibrillation Currently rate controlled. Patient is supposed to be on diltiazem 240 mg daily and a such will resume this. Coumadin for anticoagulations.  #4 type 2 diabetes Hemoglobin A1c is pending. Continue sliding scale insulin.  #5 hypertension Stable.  #6 questionable prostatitis Urinalysis is negative. Patient currently on IV vancomycin. Follow.  #7 prophylaxis Patient on Coumadin for DVT prophylaxis.   Code Status: Full Family Communication: Updated patient and her family at bedside Disposition Plan: Home when medically stable   Brief narrative: Justin [name] is a 71 y.o. male with prior h/o of HTN, DM had an injury 3 weeks ago on his Left leg, which developed into cellulitis with a small hematoma. He was treated with doxycycline as outpatient, but his cellulitis has't improved. He was also recently treated for prostatitis with oral antibiotics. He went to see his PCP today at his clinic and was sent to [facility] long for worsening cellulitis and urinary retention. A foley catheter was placed and of urine was drained. Pt doesn't have any specific  complaints at this time. He is being admitted to the hospital for IV antibiotics. Dr Sim Boast from Urology service was already contacted for urinary retention.    Consultants:  Urology: Dr. Laverle Patter 03/25/2012  Procedures:  LE doppler 03/25/12  Plain films of the left lower extremity 03/25/2012  Antibiotics:  IV Vancomycin 03/25/12  HPI/Subjective: Patient with no complaints.  Objective: Filed Vitals:   03/25/12 0425 03/25/12 1500 03/25/12 2150 03/26/12 0545  BP: 128/90 131/68 131/80 114/84  Pulse: 77 76 90 66  Temp: 98.2 F (36.8 C) 97.8 F (36.6 C) 98.2 F (36.8 C) 97.5 F (36.4 C)  TempSrc: Oral Oral Oral Oral  Resp: 18 18 20 20   Height:      Weight:      SpO2: 98% 96% 98% 96%    Intake/Output Summary (Last 24 hours) at 03/26/12 1222 Last data filed at 03/26/12 0602  Gross per 24 hour  Intake   1958 ml  Output   2925 ml  Net   -967 ml   Filed Weights   03/24/12 2251  Weight: 92.9 kg (204 lb 12.9 oz)    Exam:   General:  NAD   Cardiovascular: RRR. Left lower extremity with decreased erythema, decreased warmth, nontender to palpation.  Respiratory: CTAB  Abdomen: Soft/NT/ND/+BS  Data Reviewed: Basic Metabolic Panel:  Lab 03/26/12 8119 03/25/12 0354 03/24/12 1942  NA 138 138 142  K 3.5 3.8 4.2  CL 105 103 105  CO2 22 24 --  GLUCOSE 103* 109* 68*  BUN 15 16 19   CREATININE 0.82 1.00 1.10  CALCIUM 9.0 9.6 --  MG -- -- --  PHOS -- -- --   Liver Function Tests: No results found for this basename: AST:5,ALT:5,ALKPHOS:5,BILITOT:5,PROT:5,ALBUMIN:5 in the  last 168 hours No results found for this basename: LIPASE:5,AMYLASE:5 in the last 168 hours No results found for this basename: AMMONIA:5 in the last 168 hours CBC:  Lab 03/26/12 0421 03/25/12 0354 03/24/12 1942 03/24/12 1931  WBC 6.6 8.6 -- 5.5  NEUTROABS -- -- -- 2.0  HGB 14.1 14.1 15.6 13.9  HCT 43.2 42.0 46.0 42.6  MCV 92.3 92.7 -- 92.2  PLT 360 352 -- 354   Cardiac Enzymes: No results  found for this basename: CKTOTAL:5,CKMB:5,CKMBINDEX:5,TROPONINI:5 in the last 168 hours BNP (last 3 results) No results found for this basename: PROBNP:3 in the last 8760 hours CBG:  Lab 03/26/12 1136 03/26/12 0757 03/25/12 2139 03/25/12 1658 03/25/12 1149  GLUCAP 125* 153* 156* 105* 132*    Recent Results (from the past 240 hour(s))  CULTURE, BLOOD (ROUTINE X 2)     Status: Normal (Preliminary result)   Collection Time   03/24/12  7:31 PM      Component Value Range Status Comment   Specimen Description BLOOD RIGHT ARM   Final    Special Requests BOTTLES DRAWN AEROBIC AND ANAEROBIC 5 CC EACH   Final    Culture  Setup Time 03/25/2012 00:43   Final    Culture     Final    Value:        BLOOD CULTURE RECEIVED NO GROWTH TO DATE CULTURE WILL BE HELD FOR 5 DAYS BEFORE ISSUING A FINAL NEGATIVE REPORT   Report Status PENDING   Incomplete   CULTURE, BLOOD (ROUTINE X 2)     Status: Normal (Preliminary result)   Collection Time   03/24/12  7:31 PM      Component Value Range Status Comment   Specimen Description BLOOD LEFT ARM   Final    Special Requests BOTTLES DRAWN AEROBIC AND ANAEROBIC 5 CC EACH   Final    Culture  Setup Time 03/25/2012 00:43   Final    Culture     Final    Value:        BLOOD CULTURE RECEIVED NO GROWTH TO DATE CULTURE WILL BE HELD FOR 5 DAYS BEFORE ISSUING A FINAL NEGATIVE REPORT   Report Status PENDING   Incomplete   URINE CULTURE     Status: Normal   Collection Time   03/24/12  7:38 PM      Component Value Range Status Comment   Specimen Description URINE, CLEAN CATCH   Final    Special Requests NONE   Final    Culture  Setup Time 03/25/2012 03:53   Final    Colony Count NO GROWTH   Final    Culture NO GROWTH   Final    Report Status 03/26/2012 FINAL   Final      Studies: Dg Femur Left  03/25/2012  *RADIOLOGY REPORT*  Clinical Data: Pain.  LEFT FEMUR - 2 VIEW  Comparison: Plain films left hip and femur 08/22/2006.  Findings: No acute bony or joint abnormality is  identified. Chondrocalcinosis about the knee is noted.  There is some medial compartment joint space narrowing.  IMPRESSION:  1.  No acute finding. 2.  Chondrocalcinosis about the knee with medial compartment joint space narrowing.   Original Report Authenticated By: Bernadene Bell. D'ALESSIO, M.D.    Dg Tibia/fibula Left  03/25/2012  *RADIOLOGY REPORT*  Clinical Data: Pain, redness and swelling.  LEFT TIBIA AND FIBULA - 2 VIEW  Comparison: None.  Findings: Plate and screws are in place fixing a distal fibular fracture.  Syndesmotic screw is noted.  Hardware is intact.  There is an old healed fracture of the proximal fibular diaphysis.  No acute bony or joint abnormality is identified.  No soft tissue gas collection.  Plantar calcaneal spur is noted.  IMPRESSION:  1.  No acute finding. 2.  Old healed fibular fractures with hardware in place.   Original Report Authenticated By: Bernadene Bell. D'ALESSIO, M.D.     Scheduled Meds:    . diltiazem  240 mg Oral Daily  . docusate sodium  100 mg Oral BID  . gemfibrozil  600 mg Oral BID AC  . insulin aspart  0-5 Units Subcutaneous QHS  . insulin aspart  0-9 Units Subcutaneous TID WC  . pantoprazole  40 mg Oral Q0600  . phenazopyridine  100 mg Oral TID WC  . potassium chloride  40 mEq Oral Once  . senna  1 tablet Oral BID  . Tamsulosin HCl  0.4 mg Oral Daily  . vancomycin  1,000 mg Intravenous Q12H  . warfarin  5 mg Oral ONCE-1800  . warfarin  7.5 mg Oral ONCE-1800  . Warfarin - Pharmacist Dosing Inpatient   Does not apply q1800  . DISCONTD: metFORMIN  1,000 mg Oral BID   Continuous Infusions:    . DISCONTD: sodium chloride Stopped (03/26/12 0852)    Principal Problem:  *Cellulitis Active Problems:  ANEMIA, IRON DEFICIENCY  GASTROESOPHAGEAL REFLUX DISEASE, CHRONIC  Diabetes mellitus  Urinary retention  Atrial fibrillation  HTN (hypertension)    Time spent: > 30 mins    [facility]  [facility] Pager [phone]. If 8PM-8AM, please  contact night-coverage at [email or web address], password [facility] 03/26/2012, 12:22 PM  LOS: 2 days

## 2012-03-26 NOTE — Progress Notes (Signed)
ANTICOAGULATION CONSULT NOTE - Follow Up Consult  Pharmacy Consult for Warfarin Indication: atrial fibrillation  Allergies  Allergen Reactions  . Niacin And Related     Unknown     Patient Measurements: Height: 6' (182.9 cm) Weight: 204 lb 12.9 oz (92.9 kg) IBW/kg (Calculated) : 77.6   Vital Signs: Temp: 97.5 F (36.4 C) (09/29 0545) Temp src: Oral (09/29 0545) BP: 114/84 mmHg (09/29 0545) Pulse Rate: 66  (09/29 0545)  Labs:  Basename 03/26/12 0421 03/25/12 0359 03/25/12 0354 03/24/12 1942 03/24/12 1931  HGB 14.1 -- 14.1 -- --  HCT 43.2 -- 42.0 46.0 --  PLT 360 -- 352 -- 354  APTT -- -- -- -- --  LABPROT 18.1* 22.6* -- -- --  INR 1.55* 2.09* -- -- --  HEPARINUNFRC -- -- -- -- --  CREATININE 0.82 -- 1.00 1.10 --  CKTOTAL -- -- -- -- --  CKMB -- -- -- -- --  TROPONINI -- -- -- -- --    Estimated Creatinine Clearance: 88.1 ml/min (by C-G formula based on Cr of 0.82).   Medications:  Scheduled:     . docusate sodium  100 mg Oral BID  . gemfibrozil  600 mg Oral BID AC  . insulin aspart  0-5 Units Subcutaneous QHS  . insulin aspart  0-9 Units Subcutaneous TID WC  . pantoprazole  40 mg Oral Q0600  . phenazopyridine  100 mg Oral TID WC  . potassium chloride  40 mEq Oral Once  . senna  1 tablet Oral BID  . Tamsulosin HCl  0.4 mg Oral Daily  . vancomycin  1,000 mg Intravenous Q12H  . warfarin  5 mg Oral ONCE-1800  . Warfarin - Pharmacist Dosing Inpatient   Does not apply q1800  . DISCONTD: metFORMIN  1,000 mg Oral BID   Infusions:     . DISCONTD: sodium chloride Stopped (03/26/12 0852)    Assessment:  71 YOM admit on chronic warfarin for hx of Afib.  PTA warfarin dose was 5mg  PO daily, last dose 9/26.  Inpatient dosing started on 9/28  INR sub-therapeutic at 1.55, likely d/t missed dose 9/27  CBC ok, no complications reported  Goal of Therapy:  INR 2-3    Plan:   Boosted dose warfarin 7.5 mg PO today x1  Daily INR, CBC   Lynann Beaver  PharmD, BCPS Pager [phone] 03/26/2012 11:40 AM

## 2012-03-27 LAB — BASIC METABOLIC PANEL
CO2: 27 mEq/L (ref 19–32)
Calcium: 9.3 mg/dL (ref 8.4–10.5)
Chloride: 103 mEq/L (ref 96–112)
Potassium: 4.4 mEq/L (ref 3.5–5.1)
Sodium: 138 mEq/L (ref 135–145)

## 2012-03-27 LAB — PROTIME-INR: Prothrombin Time: 18.7 seconds — ABNORMAL HIGH (ref 11.6–15.2)

## 2012-03-27 LAB — CBC
Platelets: 355 10*3/uL (ref 150–400)
RBC: 4.7 MIL/uL (ref 4.22–5.81)
WBC: 7.6 10*3/uL (ref 4.0–10.5)

## 2012-03-27 LAB — GLUCOSE, CAPILLARY
Glucose-Capillary: 145 mg/dL — ABNORMAL HIGH (ref 70–99)
Glucose-Capillary: 120 mg/dL — ABNORMAL HIGH (ref 70–99)

## 2012-03-27 MED ORDER — CEPHALEXIN 500 MG PO CAPS
500.0000 mg | ORAL_CAPSULE | Freq: Four times a day (QID) | ORAL | Status: DC
  Administered 2012-03-27: 500 mg via ORAL
  Filled 2012-03-27 (×4): qty 1

## 2012-03-27 MED ORDER — CEPHALEXIN 500 MG PO CAPS: 500.0000 mg | ORAL_CAPSULE | Freq: Four times a day (QID) | ORAL | Status: AC

## 2012-03-27 MED ORDER — WARFARIN SODIUM 5 MG PO TABS
5.0000 mg | ORAL_TABLET | Freq: Once | ORAL | Status: DC
Start: 2012-03-27 — End: 2012-03-27
  Filled 2012-03-27: qty 1

## 2012-03-27 NOTE — Progress Notes (Signed)
Patient ID: Justin [name], male   DOB: 12-03-1940, 71 y.o.   MRN: [id number]    Subjective: Pt had catheter removed.  He then was able to void but PVR was > 500 cc and catheter was replaced.  Objective: Vital signs in last 24 hours: Temp:  [97.7 F (36.5 C)-98.1 F (36.7 C)] 97.7 F (36.5 C) (09/30 0541) Pulse Rate:  [71-102] 91  (09/30 0541) Resp:  [16-20] 20  (09/30 0541) BP: (117-132)/(61-81) 122/78 mmHg (09/30 0541) SpO2:  [96 %-99 %] 96 % (09/30 0541)  Intake/Output from previous day: 09/29 0701 - 09/30 0700 In: 1320 [P.O.:1320] Out: 2350 [Urine:2350] Intake/Output this shift: Total I/O In: 360 [P.O.:360] Out: -   Physical Exam:  General: Alert and oriented GU: Catheter in place and urine grossly clear  Lab Results:  Basename 03/27/12 0334 03/26/12 0421 03/25/12 0354  HGB 14.2 14.1 14.1  HCT 43.9 43.2 42.0   BMET  Basename 03/27/12 0334 03/26/12 0421  NA 138 138  K 4.4 3.5  CL 103 105  CO2 27 22  GLUCOSE 124* 103*  BUN 17 15  CREATININE 0.97 0.82  CALCIUM 9.3 9.0     Assessment/Plan: - Leave catheter indwelling and discharge with catheter.  Continue tamsulosin.  I will arrange outpatient followup and voiding trial.   LOS: 3 days   Sahas Sluka,LES 03/27/2012, 12:46 PM

## 2012-03-27 NOTE — Discharge Summary (Signed)
Physician Discharge Summary  ABIMELEC GROCHOWSKI NWG:[id number] DOB: Jul 10, 1940 DOA: 03/24/2012  PCP: Milana Obey, MD  Admit date: 03/24/2012 Discharge date: 03/27/2012  Recommendations for Outpatient Follow-up:  1. Patient is to followup with PCP one week post discharge for followup on his left lower extremity cellulitis. 2. Patient will be called by Dr. Vevelyn Royals office urology to be scheduled for outpatient followup for further evaluation of his urinary retention and voiding trial. 3. Patient is to followup at his cardiologist's office, Dr Royann Shivers, [facility] for Coumadin check for PT/INR on Wednesday, 03/29/2012.  Discharge Diagnoses:  Principal Problem:  *Cellulitis Active Problems:  ANEMIA, IRON DEFICIENCY  GASTROESOPHAGEAL REFLUX DISEASE, CHRONIC  Diabetes mellitus  Urinary retention  Atrial fibrillation  HTN (hypertension)   Discharge Condition: Stable and improved  Diet recommendation: Heart healthy  Filed Weights   03/24/12 2251  Weight: 92.9 kg (204 lb 12.9 oz)    History of present illness:  Justin [name] is a 71 y.o. male with prior h/o of HTN, DM had an injury 3 weeks ago on his Left leg, which developed into cellulitis with a small hematoma. He was treated with doxycycline as outpatient, but his cellulitis has't improved. He was also recently treated for prostatitis with oral antibiotics. He went to see his PCP today at his clinic and was sent to [facility] long for worsening cellulitis and urinary retention. A foley catheter was placed and of urine was drained. Pt doesn't have any specific complaints at this time. He is being admitted to the hospital for IV antibiotics. Dr Sim Boast from Urology service was already contacted for urinary retention.    Hospital Course:  #1 left lower extremity cellulitis Patient was admitted with a left lower extremity cellulitis that failed outpatient treatment with doxycycline. Patient was admitted he was placed on empiric IV vancomycin and  monitored. Dopplers of the lower extremity were done which were negative for DVT or Baker's cyst. Plain films of the left lower extremity was also done which was negative for any fracture or abscess formation all fluid collection. Patient improved clinically on IV Levaquin and IV discharge home on oral Keflex for 5 more days to complete a seven-day course of antibiotic therapy. Patient will followup with PCP one week post discharge. Patient be discharged in stable and improved condition.  #2 urinary retention On admission patient was noted to have urinary retention. A bladder scan which was done showed 940 cc of urine. A Foley catheter was placed with good urine output. Urinalysis which was done was negative for UTI. Patient was placed on Flomax monitored. A urology consultation was obtained and patient was seen in consultation by Dr. Lattie Corns on 03/25/2012. It was felt per urology the patient's urinary retention was likely secondary to BPH. Patient was maintained on Flomax. Patient had a voiding trial done on the morning of discharge however he failed his voiding trial as bladder scan done post void had 503 cc. Foley catheter was placed back in and case was discussed with Dr. Laverle Patter. Patient will be discharged with a Foley catheter and on Flomax. Patient will followup with urology as outpatient for outpatient voiding trial and further evaluation and management.  #3 atrial fibrillation This remained stable during the hospitalization. Patient was restarted back on his diltiazem for rate control. Patient's INR on admission was subtherapeutic and was placed back on his Coumadin. Patient's INR level is slowly increased during the hospitalization such that by day of discharge his INR was 1.62. Patient be discharged  home on his home dose of 5 mg of Coumadin daily and will need a PT INR drawn on Wednesday, 03/29/2012 at his cardiologist's office. Next I  The rest of patient's chronic medical issues remained  stable throughout the hospitalization and patient be discharged in stable and improved condition.  Procedures:  Plain films of the left femur 03/17/2012  Plain films of the left tibia/fibula 03/25/2012  Lower extremity Dopplers 03/25/2012  Consultations:  Urology: Dr. Crecencio Mc  03/25/2012  Discharge Exam: Filed Vitals:   03/26/12 0545 03/26/12 1300 03/26/12 2212 03/27/12 0541  BP: 114/84 117/61 132/81 122/78  Pulse: 66 71 102 91  Temp: 97.5 F (36.4 C) 98 F (36.7 C) 98.1 F (36.7 C) 97.7 F (36.5 C)  TempSrc: Oral  Oral Oral  Resp: 20 16 20 20   Height:      Weight:      SpO2: 96% 99% 96% 96%    General: NAD Cardiovascular: RRR Respiratory: Soft/NT/ND/+BS  Discharge Instructions  Discharge Orders    Future Orders Please Complete By Expires   Diet - low sodium heart healthy      Increase activity slowly      Discharge instructions      Comments:   Follow up with Milana Obey, MD in 1 week. Follow up in coumadin clinic at Dr Royann Shivers office on Wednesday 03/29/12 for PT/INR check. Follow up with urology, Dr Laverle Patter in 1-2 weeks.Urology office will call you to schedule appointment.       Medication List     As of 03/27/2012 11:02 AM    TAKE these medications         cephALEXin 500 MG capsule   Commonly known as: KEFLEX   Take 1 capsule (500 mg total) by mouth every 6 (six) hours. Take for 5 days then stop.      CINNAMON PO   Take 1,000 mg by mouth daily.      diltiazem 240 MG 24 hr capsule   Commonly known as: DILACOR XR   Take 240 mg by mouth daily.      gemfibrozil 600 MG tablet   Commonly known as: LOPID   Take 600 mg by mouth 2 (two) times daily before a meal.      glipiZIDE 10 MG tablet   Commonly known as: GLUCOTROL   Take 10 mg by mouth daily.      HYDROcodone-acetaminophen 5-325 MG per tablet   Commonly known as: NORCO/VICODIN   Take 1 tablet by mouth every 6 (six) hours as needed. Pain      metFORMIN 1000 MG tablet   Commonly  known as: GLUCOPHAGE   Take 1,000 mg by mouth 2 (two) times daily.      PROAIR HFA 108 (90 BASE) MCG/ACT inhaler   Generic drug: albuterol   Inhale 2 puffs into the lungs every 6 (six) hours as needed. For shortness of breath      Tamsulosin HCl 0.4 MG Caps   Commonly known as: FLOMAX   Take 0.4 mg by mouth daily.      vitamin B-12 500 MCG tablet   Commonly known as: CYANOCOBALAMIN   Take 1,000 mcg by mouth daily.      warfarin 5 MG tablet   Commonly known as: COUMADIN   Take 5 mg by mouth daily.           Follow-up Information    Follow up with Milana Obey, MD. Schedule an appointment as soon as possible for a visit  in 1 week.   Contact information:   [address] [place] [address] [phone]       Follow up with Thurmon Fair, MD. On 03/29/2012. (f/u at coumadin clinic in Dr C office on wednesday for coumadin check.)    Contact information:   [address] [place] [address] [phone]       Follow up with Crecencio Mc, MD. (Urology office will call to schedule appointment for voiding trial)    Contact information:   [address] [place] [address] [phone]           The results of significant diagnostics from this hospitalization (including imaging, microbiology, ancillary and laboratory) are listed below for reference.    Significant Diagnostic Studies: Dg Femur Left  03/25/2012  *RADIOLOGY REPORT*  Clinical Data: Pain.  LEFT FEMUR - 2 VIEW  Comparison: Plain films left hip and femur 08/22/2006.  Findings: No acute bony or joint abnormality is identified. Chondrocalcinosis about the knee is noted.  There is some medial compartment joint space narrowing.  IMPRESSION:  1.  No acute finding. 2.  Chondrocalcinosis about the knee with medial compartment joint space narrowing.   Original Report Authenticated By: Bernadene Bell. D'ALESSIO, M.D.    Dg Tibia/fibula  Left  03/25/2012  *RADIOLOGY REPORT*  Clinical Data: Pain, redness and swelling.  LEFT TIBIA AND FIBULA - 2 VIEW  Comparison: None.  Findings: Plate and screws are in place fixing a distal fibular fracture.  Syndesmotic screw is noted.  Hardware is intact.  There is an old healed fracture of the proximal fibular diaphysis.  No acute bony or joint abnormality is identified.  No soft tissue gas collection.  Plantar calcaneal spur is noted.  IMPRESSION:  1.  No acute finding. 2.  Old healed fibular fractures with hardware in place.   Original Report Authenticated By: Bernadene Bell. Maricela Curet, M.D.     Microbiology: Recent Results (from the past 240 hour(s))  CULTURE, BLOOD (ROUTINE X 2)     Status: Normal (Preliminary result)   Collection Time   03/24/12  7:31 PM      Component Value Range Status Comment   Specimen Description BLOOD RIGHT ARM   Final    Special Requests BOTTLES DRAWN AEROBIC AND ANAEROBIC 5 CC EACH   Final    Culture  Setup Time 03/25/2012 00:43   Final    Culture     Final    Value:        BLOOD CULTURE RECEIVED NO GROWTH TO DATE CULTURE WILL BE HELD FOR 5 DAYS BEFORE ISSUING A FINAL NEGATIVE REPORT   Report Status PENDING   Incomplete   CULTURE, BLOOD (ROUTINE X 2)     Status: Normal (Preliminary result)   Collection Time   03/24/12  7:31 PM      Component Value Range Status Comment   Specimen Description BLOOD LEFT ARM   Final    Special Requests BOTTLES DRAWN AEROBIC AND ANAEROBIC 5 CC EACH   Final    Culture  Setup Time 03/25/2012 00:43   Final    Culture     Final    Value:        BLOOD CULTURE RECEIVED NO GROWTH TO DATE CULTURE WILL BE HELD FOR 5 DAYS BEFORE ISSUING A FINAL NEGATIVE REPORT   Report Status PENDING   Incomplete   URINE CULTURE     Status: Normal   Collection  Time   03/24/12  7:38 PM      Component Value Range Status Comment   Specimen Description URINE, CLEAN CATCH   Final    Special Requests NONE   Final    Culture  Setup Time 03/25/2012 03:53   Final     Colony Count NO GROWTH   Final    Culture NO GROWTH   Final    Report Status 03/26/2012 FINAL   Final      Labs: Basic Metabolic Panel:  Lab 03/27/12 4098 03/26/12 0421 03/25/12 0354 03/24/12 1942  NA 138 138 138 142  K 4.4 3.5 3.8 4.2  CL 103 105 103 105  CO2 27 22 24  --  GLUCOSE 124* 103* 109* 68*  BUN 17 15 16 19   CREATININE 0.97 0.82 1.00 1.10  CALCIUM 9.3 9.0 9.6 --  MG -- -- -- --  PHOS -- -- -- --   Liver Function Tests: No results found for this basename: AST:5,ALT:5,ALKPHOS:5,BILITOT:5,PROT:5,ALBUMIN:5 in the last 168 hours No results found for this basename: LIPASE:5,AMYLASE:5 in the last 168 hours No results found for this basename: AMMONIA:5 in the last 168 hours CBC:  Lab 03/27/12 0334 03/26/12 0421 03/25/12 0354 03/24/12 1942 03/24/12 1931  WBC 7.6 6.6 8.6 -- 5.5  NEUTROABS -- -- -- -- 2.0  HGB 14.2 14.1 14.1 15.6 13.9  HCT 43.9 43.2 42.0 46.0 42.6  MCV 93.4 92.3 92.7 -- 92.2  PLT 355 360 352 -- 354   Cardiac Enzymes: No results found for this basename: CKTOTAL:5,CKMB:5,CKMBINDEX:5,TROPONINI:5 in the last 168 hours BNP: BNP (last 3 results) No results found for this basename: PROBNP:3 in the last 8760 hours CBG:  Lab 03/27/12 0738 03/26/12 2158 03/26/12 1716 03/26/12 1136 03/26/12 0757  GLUCAP 145* 176* 132* 125* 153*    Time coordinating discharge: 60 minutes  Signed:  Teesha Ohm  [facility] 03/27/2012, 11:02 AM

## 2012-03-27 NOTE — Progress Notes (Signed)
ANTICOAGULATION CONSULT NOTE - Follow Up Consult  Pharmacy Consult for Warfarin Indication: atrial fibrillation  Allergies  Allergen Reactions  . Niacin And Related     Unknown     Patient Measurements: Height: 6' (182.9 cm) Weight: 204 lb 12.9 oz (92.9 kg) IBW/kg (Calculated) : 77.6   Vital Signs: Temp: 97.7 F (36.5 C) (09/30 0541) Temp src: Oral (09/30 0541) BP: 122/78 mmHg (09/30 0541) Pulse Rate: 91  (09/30 0541)  Labs:  Basename 03/27/12 0334 03/26/12 0421 03/25/12 0359 03/25/12 0354  HGB 14.2 14.1 -- --  HCT 43.9 43.2 -- 42.0  PLT 355 360 -- 352  APTT -- -- -- --  LABPROT 18.7* 18.1* 22.6* --  INR 1.62* 1.55* 2.09* --  HEPARINUNFRC -- -- -- --  CREATININE 0.97 0.82 -- 1.00  CKTOTAL -- -- -- --  CKMB -- -- -- --  TROPONINI -- -- -- --    Estimated Creatinine Clearance: 74.4 ml/min (by C-G formula based on Cr of 0.97).   Medications:  Scheduled:     . diltiazem  240 mg Oral Daily  . docusate sodium  100 mg Oral BID  . gemfibrozil  600 mg Oral BID AC  . insulin aspart  0-5 Units Subcutaneous QHS  . insulin aspart  0-9 Units Subcutaneous TID WC  . pantoprazole  40 mg Oral Q0600  . potassium chloride  40 mEq Oral Once  . senna  1 tablet Oral BID  . Tamsulosin HCl  0.4 mg Oral Daily  . vancomycin  1,000 mg Intravenous Q12H  . warfarin  7.5 mg Oral ONCE-1800  . Warfarin - Pharmacist Dosing Inpatient   Does not apply q1800  . DISCONTD: phenazopyridine  100 mg Oral TID WC   Infusions:     Assessment:  71 YOM admit on chronic warfarin for hx of Afib. Admitted 9/27 with cellulitis.  PTA warfarin dose was 5mg  PO daily, last dose 9/26.  Inpatient dosing started on 9/28  INR sub-therapeutic but rising (1.62), missed dose 9/27. 5mg  9/28, 7.5mg  9/29  CBC ok, no bleeding reported/documented  Goal of Therapy:  INR 2-3    Plan:   Coumadin 5mg  tonight  Daily INR, CBC  Gwen Her PharmD  [phone] 03/27/2012 8:46 AM

## 2012-03-31 LAB — CULTURE, BLOOD (ROUTINE X 2)
Culture: NO GROWTH
Culture: NO GROWTH

## 2012-04-28 DIAGNOSIS — I509 Heart failure, unspecified: Secondary | ICD-10-CM

## 2012-04-28 HISTORY — DX: Heart failure, unspecified: I50.9

## 2012-05-05 ENCOUNTER — Other Ambulatory Visit (HOSPITAL_COMMUNITY): Payer: Self-pay | Admitting: Family Medicine

## 2012-05-05 ENCOUNTER — Ambulatory Visit (HOSPITAL_COMMUNITY)
Admission: RE | Admit: 2012-05-05 | Discharge: 2012-05-05 | Disposition: A | Discharge status: Home / Self Care | Payer: Medicare Other | Source: Ambulatory Visit | Attending: Family Medicine | Admitting: Family Medicine

## 2012-05-05 DIAGNOSIS — I517 Cardiomegaly: Secondary | ICD-10-CM | POA: Insufficient documentation

## 2012-05-05 DIAGNOSIS — E119 Type 2 diabetes mellitus without complications: Secondary | ICD-10-CM | POA: Insufficient documentation

## 2012-05-05 DIAGNOSIS — R0602 Shortness of breath: Secondary | ICD-10-CM | POA: Insufficient documentation

## 2012-05-05 DIAGNOSIS — R059 Cough, unspecified: Secondary | ICD-10-CM | POA: Insufficient documentation

## 2012-05-05 DIAGNOSIS — I10 Essential (primary) hypertension: Secondary | ICD-10-CM | POA: Insufficient documentation

## 2012-05-05 DIAGNOSIS — J209 Acute bronchitis, unspecified: Secondary | ICD-10-CM

## 2012-05-05 DIAGNOSIS — K449 Diaphragmatic hernia without obstruction or gangrene: Secondary | ICD-10-CM | POA: Insufficient documentation

## 2012-05-05 DIAGNOSIS — R05 Cough: Secondary | ICD-10-CM | POA: Insufficient documentation

## 2012-05-08 ENCOUNTER — Emergency Department (HOSPITAL_COMMUNITY): Payer: Medicare Other

## 2012-05-08 ENCOUNTER — Encounter (HOSPITAL_COMMUNITY): Payer: Self-pay | Admitting: Emergency Medicine

## 2012-05-08 ENCOUNTER — Inpatient Hospital Stay (HOSPITAL_COMMUNITY)
Admission: EM | Admit: 2012-05-08 | Discharge: 2012-05-16 | DRG: 286 | Disposition: A | Discharge status: Home Health | Payer: Medicare Other | Attending: Cardiovascular Disease | Admitting: Cardiovascular Disease

## 2012-05-08 DIAGNOSIS — I4821 Permanent atrial fibrillation: Secondary | ICD-10-CM | POA: Diagnosis present

## 2012-05-08 DIAGNOSIS — J90 Pleural effusion, not elsewhere classified: Secondary | ICD-10-CM

## 2012-05-08 DIAGNOSIS — Z7901 Long term (current) use of anticoagulants: Secondary | ICD-10-CM

## 2012-05-08 DIAGNOSIS — J189 Pneumonia, unspecified organism: Secondary | ICD-10-CM | POA: Diagnosis present

## 2012-05-08 DIAGNOSIS — Z79899 Other long term (current) drug therapy: Secondary | ICD-10-CM

## 2012-05-08 DIAGNOSIS — I4891 Unspecified atrial fibrillation: Secondary | ICD-10-CM | POA: Diagnosis present

## 2012-05-08 DIAGNOSIS — E876 Hypokalemia: Secondary | ICD-10-CM | POA: Diagnosis not present

## 2012-05-08 DIAGNOSIS — J811 Chronic pulmonary edema: Secondary | ICD-10-CM

## 2012-05-08 DIAGNOSIS — E1149 Type 2 diabetes mellitus with other diabetic neurological complication: Secondary | ICD-10-CM | POA: Diagnosis present

## 2012-05-08 DIAGNOSIS — J40 Bronchitis, not specified as acute or chronic: Secondary | ICD-10-CM | POA: Diagnosis present

## 2012-05-08 DIAGNOSIS — E1142 Type 2 diabetes mellitus with diabetic polyneuropathy: Secondary | ICD-10-CM | POA: Diagnosis present

## 2012-05-08 DIAGNOSIS — IMO0001 Reserved for inherently not codable concepts without codable children: Secondary | ICD-10-CM

## 2012-05-08 DIAGNOSIS — Z8249 Family history of ischemic heart disease and other diseases of the circulatory system: Secondary | ICD-10-CM

## 2012-05-08 DIAGNOSIS — I509 Heart failure, unspecified: Secondary | ICD-10-CM | POA: Diagnosis present

## 2012-05-08 DIAGNOSIS — R918 Other nonspecific abnormal finding of lung field: Secondary | ICD-10-CM

## 2012-05-08 DIAGNOSIS — E785 Hyperlipidemia, unspecified: Secondary | ICD-10-CM | POA: Diagnosis present

## 2012-05-08 DIAGNOSIS — I428 Other cardiomyopathies: Secondary | ICD-10-CM | POA: Diagnosis present

## 2012-05-08 DIAGNOSIS — I5021 Acute systolic (congestive) heart failure: Principal | ICD-10-CM | POA: Diagnosis present

## 2012-05-08 DIAGNOSIS — E119 Type 2 diabetes mellitus without complications: Secondary | ICD-10-CM | POA: Diagnosis present

## 2012-05-08 DIAGNOSIS — I5023 Acute on chronic systolic (congestive) heart failure: Secondary | ICD-10-CM | POA: Diagnosis present

## 2012-05-08 DIAGNOSIS — I10 Essential (primary) hypertension: Secondary | ICD-10-CM | POA: Diagnosis present

## 2012-05-08 HISTORY — PX: CARDIAC CATHETERIZATION: SHX172

## 2012-05-08 LAB — CBC WITH DIFFERENTIAL/PLATELET
Basophils Absolute: 0 10*3/uL (ref 0.0–0.1)
Basophils Relative: 0 % (ref 0–1)
Eosinophils Absolute: 0.3 10*3/uL (ref 0.0–0.7)
Eosinophils Relative: 2 % (ref 0–5)
Lymphs Abs: 3.1 10*3/uL (ref 0.7–4.0)
MCH: 30.9 pg (ref 26.0–34.0)
MCV: 94.4 fL (ref 78.0–100.0)
Neutrophils Relative %: 59 % (ref 43–77)
Platelets: 413 10*3/uL — ABNORMAL HIGH (ref 150–400)
RBC: 4.44 MIL/uL (ref 4.22–5.81)
RDW: 15 % (ref 11.5–15.5)

## 2012-05-08 LAB — COMPREHENSIVE METABOLIC PANEL
ALT: 14 U/L (ref 0–53)
AST: 25 U/L (ref 0–37)
Albumin: 3.4 g/dL — ABNORMAL LOW (ref 3.5–5.2)
Alkaline Phosphatase: 66 U/L (ref 39–117)
Calcium: 9.8 mg/dL (ref 8.4–10.5)
GFR calc Af Amer: >90 mL/min (ref >90)
Potassium: 4.2 mEq/L (ref 3.5–5.1)
Sodium: 140 mEq/L (ref 135–145)
Total Protein: 7.5 g/dL (ref 6.0–8.3)

## 2012-05-08 LAB — GLUCOSE, CAPILLARY

## 2012-05-08 LAB — MRSA PCR SCREENING: MRSA by PCR: NEGATIVE

## 2012-05-08 MED ORDER — FUROSEMIDE 40 MG PO TABS
40.0000 mg | ORAL_TABLET | Freq: Once | ORAL | Status: AC
  Administered 2012-05-08: 40 mg via ORAL
  Filled 2012-05-08: qty 1

## 2012-05-08 MED ORDER — ACETAMINOPHEN 500 MG PO TABS
500.0000 mg | ORAL_TABLET | ORAL | Status: DC | PRN
  Filled 2012-05-08: qty 1

## 2012-05-08 MED ORDER — INSULIN ASPART 100 UNIT/ML ~~LOC~~ SOLN
0.0000 [IU] | Freq: Three times a day (TID) | SUBCUTANEOUS | Status: DC
  Administered 2012-05-09: 1 [IU] via SUBCUTANEOUS
  Administered 2012-05-09 (×2): 3 [IU] via SUBCUTANEOUS
  Administered 2012-05-10: 5 [IU] via SUBCUTANEOUS
  Administered 2012-05-10: 1 [IU] via SUBCUTANEOUS
  Administered 2012-05-10: 3 [IU] via SUBCUTANEOUS
  Administered 2012-05-11: 2 [IU] via SUBCUTANEOUS
  Administered 2012-05-11: 3 [IU] via SUBCUTANEOUS
  Administered 2012-05-11: 2 [IU] via SUBCUTANEOUS
  Administered 2012-05-12 (×2): 1 [IU] via SUBCUTANEOUS
  Administered 2012-05-13: 3 [IU] via SUBCUTANEOUS
  Administered 2012-05-13: 09:00:00 1 [IU] via SUBCUTANEOUS
  Administered 2012-05-13 – 2012-05-14 (×2): 2 [IU] via SUBCUTANEOUS
  Administered 2012-05-14: 3 [IU] via SUBCUTANEOUS
  Administered 2012-05-14: 2 [IU] via SUBCUTANEOUS
  Administered 2012-05-15 (×2): 1 [IU] via SUBCUTANEOUS
  Administered 2012-05-15: 3 [IU] via SUBCUTANEOUS
  Administered 2012-05-16: 1 [IU] via SUBCUTANEOUS
  Administered 2012-05-16: 3 [IU] via SUBCUTANEOUS

## 2012-05-08 MED ORDER — GEMFIBROZIL 600 MG PO TABS
600.0000 mg | ORAL_TABLET | Freq: Two times a day (BID) | ORAL | Status: DC
  Administered 2012-05-08 – 2012-05-16 (×15): 600 mg via ORAL
  Filled 2012-05-08 (×21): qty 1

## 2012-05-08 MED ORDER — DILTIAZEM HCL 25 MG/5ML IV SOLN
5.0000 mg | Freq: Once | INTRAVENOUS | Status: AC
  Administered 2012-05-08: 5 mg via INTRAVENOUS

## 2012-05-08 MED ORDER — FUROSEMIDE 10 MG/ML IJ SOLN: 40.0000 mg | Freq: Once | INTRAMUSCULAR | Status: DC

## 2012-05-08 MED ORDER — METFORMIN HCL 500 MG PO TABS
1000.0000 mg | ORAL_TABLET | Freq: Two times a day (BID) | ORAL | Status: DC
  Filled 2012-05-08: qty 2

## 2012-05-08 MED ORDER — IOHEXOL 350 MG/ML SOLN
100.0000 mL | Freq: Once | INTRAVENOUS | Status: AC | PRN
  Administered 2012-05-08: 100 mL via INTRAVENOUS

## 2012-05-08 MED ORDER — WARFARIN SODIUM 5 MG PO TABS
5.0000 mg | ORAL_TABLET | Freq: Every day | ORAL | Status: DC
  Administered 2012-05-08 – 2012-05-09 (×2): 5 mg via ORAL
  Filled 2012-05-08 (×2): qty 1

## 2012-05-08 MED ORDER — ALBUTEROL SULFATE HFA 108 (90 BASE) MCG/ACT IN AERS: 2.0000 | INHALATION_SPRAY | RESPIRATORY_TRACT | Status: DC | PRN

## 2012-05-08 MED ORDER — OXYBUTYNIN CHLORIDE 5 MG PO TABS
5.0000 mg | ORAL_TABLET | Freq: Three times a day (TID) | ORAL | Status: DC | PRN
  Filled 2012-05-08: qty 1

## 2012-05-08 MED ORDER — DILTIAZEM HCL 100 MG IV SOLR
5.0000 mg/h | Freq: Once | INTRAVENOUS | Status: AC
  Administered 2012-05-08: 5 mg/h via INTRAVENOUS
  Filled 2012-05-08: qty 100

## 2012-05-08 MED ORDER — DILTIAZEM HCL 100 MG IV SOLR
5.0000 mg/h | INTRAVENOUS | Status: DC
  Administered 2012-05-08: 15 mg/h via INTRAVENOUS
  Administered 2012-05-09: 5 mg/h via INTRAVENOUS
  Administered 2012-05-09: 10 mg/h via INTRAVENOUS
  Administered 2012-05-09: 15 mg/h via INTRAVENOUS
  Administered 2012-05-10 – 2012-05-11 (×2): 10 mg/h via INTRAVENOUS
  Filled 2012-05-08 (×2): qty 100

## 2012-05-08 MED ORDER — VITAMIN B-12 1000 MCG PO TABS
1000.0000 ug | ORAL_TABLET | Freq: Every day | ORAL | Status: DC
  Administered 2012-05-09 – 2012-05-16 (×8): 1000 ug via ORAL
  Filled 2012-05-08 (×8): qty 1

## 2012-05-08 MED ORDER — SODIUM CHLORIDE 0.9 % IV SOLN
INTRAVENOUS | Status: DC
  Administered 2012-05-08: 75 mL/h via INTRAVENOUS
  Administered 2012-05-08 – 2012-05-11 (×2): via INTRAVENOUS

## 2012-05-08 MED ORDER — DOXAZOSIN MESYLATE 8 MG PO TABS
8.0000 mg | ORAL_TABLET | Freq: Every day | ORAL | Status: DC
  Administered 2012-05-09 – 2012-05-16 (×8): 8 mg via ORAL
  Filled 2012-05-08 (×2): qty 4
  Filled 2012-05-08 (×6): qty 1

## 2012-05-08 MED ORDER — WARFARIN - PHYSICIAN DOSING INPATIENT: Freq: Every day | Status: DC

## 2012-05-08 NOTE — ED Notes (Signed)
MD at bedside. 

## 2012-05-08 NOTE — ED Notes (Signed)
Pt wife states cough with chest congestion for several days. Sent by dr [name] office for evaluation
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
## 2012-05-08 NOTE — ED Notes (Signed)
Patient transported to CT 

## 2012-05-09 ENCOUNTER — Inpatient Hospital Stay (HOSPITAL_COMMUNITY): Payer: Medicare Other

## 2012-05-09 LAB — BASIC METABOLIC PANEL
BUN: 15 mg/dL (ref 6–23)
Creatinine, Ser: 1.03 mg/dL (ref 0.50–1.35)
GFR calc Af Amer: 81 mL/min — ABNORMAL LOW (ref >90)
GFR calc non Af Amer: 70 mL/min — ABNORMAL LOW (ref >90)
Glucose, Bld: 249 mg/dL — ABNORMAL HIGH (ref 70–99)
Potassium: 3.4 mEq/L — ABNORMAL LOW (ref 3.5–5.1)

## 2012-05-09 LAB — GLUCOSE, CAPILLARY
Glucose-Capillary: 134 mg/dL — ABNORMAL HIGH (ref 70–99)
Glucose-Capillary: 229 mg/dL — ABNORMAL HIGH (ref 70–99)
Glucose-Capillary: 145 mg/dL — ABNORMAL HIGH (ref 70–99)

## 2012-05-09 LAB — PROTIME-INR: Prothrombin Time: 23.4 seconds — ABNORMAL HIGH (ref 11.6–15.2)

## 2012-05-09 MED ORDER — POTASSIUM CHLORIDE CRYS ER 20 MEQ PO TBCR
20.0000 meq | EXTENDED_RELEASE_TABLET | Freq: Two times a day (BID) | ORAL | Status: DC
  Administered 2012-05-09 – 2012-05-16 (×15): 20 meq via ORAL
  Filled 2012-05-09 (×17): qty 1

## 2012-05-09 MED ORDER — FUROSEMIDE 40 MG PO TABS
40.0000 mg | ORAL_TABLET | Freq: Every day | ORAL | Status: DC
  Administered 2012-05-09 – 2012-05-16 (×7): 40 mg via ORAL
  Filled 2012-05-09 (×8): qty 1

## 2012-05-09 MED ORDER — HYDROCODONE-HOMATROPINE 5-1.5 MG/5ML PO SYRP
5.0000 mL | ORAL_SOLUTION | ORAL | Status: DC | PRN
  Administered 2012-05-09 (×2): 5 mL via ORAL
  Filled 2012-05-09 (×2): qty 5

## 2012-05-09 MED ORDER — DEXTROSE 5 % IV SOLN
1.0000 g | INTRAVENOUS | Status: DC
  Administered 2012-05-09 – 2012-05-10 (×2): 1 g via INTRAVENOUS
  Filled 2012-05-09 (×3): qty 10

## 2012-05-09 MED ORDER — DEXTROSE 5 % IV SOLN
500.0000 mg | INTRAVENOUS | Status: DC
  Administered 2012-05-09 – 2012-05-10 (×2): 500 mg via INTRAVENOUS
  Filled 2012-05-09 (×3): qty 500

## 2012-05-09 NOTE — H&P (Signed)
Justin [name], CASEBOLT NO.:  [id number]  MEDICAL RECORD NO.:  [id number]  LOCATION:  IC01                          FACILITY:  [facility]  PHYSICIAN:  Mila Homer. [name], M.D.DATE OF BIRTH:  Oct 02, 1940  DATE OF ADMISSION:  05/08/2012 DATE OF DISCHARGE:  LH                             HISTORY & PHYSICAL   HISTORY:  This 71 year old presented to the office this morning extremely short of breath.  He had been seen in the office 3 days ago, and felt to have asthmatic bronchitis.  He had already been on Zithromax and had been on an albuterol inhaler but apparently got worse over the weekend.  MEDICAL PROBLEMS:  Include diabetes which he has had at least since 1995, benign essential hypertension, polyneuropathy and diabetes, BPH, allergic rhinitis secondary to pollen, hyperlipidemia, obstructive chronic bronchitis, diaphragmatic hernia, colonic polyps, chronic atrial fibrillation, hearing loss, proteinuria and long-term use of anticoagulants.  MEDICATIONS: 1. He is currently on albuterol as ProAir HFA 2 puffs every 4 hours. 2. Cinnamon daily. 3. Diltiazem XR 240 mg daily. 4. Doxazosin 8 mg daily. 5. Gemfibrozil 600 mg b.i.d. 6. Glipizide 10 mg daily. 7. Metformin 1000 mg b.i.d. 8. Oxybutynin 5 mg t.i.d. 9. Hydrocodone cough syrup. 10.Vitamin B12 1000 mcg daily. 11.Warfarin as directed.  PHYSICAL EXAMINATION:  Initial exam in the office showed a somewhat dyspneic 68ish year old man whose color was good but had increased respiratory rate.  His heart rate was about 110 in the office but it got up to as high as 140 in the emergency room, where I transferred him.  In the emergency room it stayed between 110 and 140.  Other vital signs include temperature 97.8, pulse 80, respiratory rate 24, blood pressure 125/80.  His speech appeared to be normal but his hearing was bad, as is normal for him.  His lungs showed decreased breath sounds throughout. His heart sounds were  also somewhat distant but decreased heart sounds. His abdomen was soft and there was not any edema of the ankles.  His admission white cell count was 10,300 of which 59% neutrophils, 30 lymphs.  His hemoglobin was 13.7.  CMP showed an albumin of 3.4 but otherwise was essentially normal.  His D-dimer was 0.50.  CT angio of the chest did not show pulmonary emboli but there was a question of moderate pulmonary edema and also moderate right and small left-sided pleural effusions.  He had multiple pulmonary nodules and he also had numerous borderline enlarged mediastinal and bilateral hilar lymph nodes.  His chest x-ray, done 3 days ago, just showed enlargement of cardiac silhouette and mild chronic bronchitic changes, left basilar scarring and small hiatal hernia.  A 12 lead EKG showed atrial flutter at a rate of 147.  ADMISSION DIAGNOSES: 1. Dyspnea probably of multifactorial etiology. 2. Probable asthmatic bronchitis. 3. Atrial fibrillation with rapid ventricular response. 4. Benign essential hypertension. 5. Type 2 diabetes. He was admitted to the ICU on a diltiazem drip.  I will have Cardiology see him tomorrow.  He has seen Dr. Royann Shivers in the past.  I will also have Pulmonary see him due to the pulmonary nodules and the  enlarged lymph nodes.  He will be on IV fluids and continue his other medications including ProAir plus antibiotics at this point.  He will have sliding scale insulin but will continue his metformin and hold on his glipizide at this point.     Mila Homer. [name], M.D.     SDK/MEDQ  D:  05/08/2012  T:  05/09/2012  Job:  [id number]

## 2012-05-09 NOTE — Progress Notes (Signed)
Inpatient Diabetes Program Recommendations  AACE/ADA: New Consensus Statement on Inpatient Glycemic Control (2013)  Target Ranges:  Prepandial:   less than 140 mg/dL      Peak postprandial:   less than 180 mg/dL (1-2 hours)      Critically ill patients:  140 - 180 mg/dL    Results for Justin [name], Justin [name] (MRN [id number]) as of 05/09/2012 12:14  Ref. Range 03/27/2012 11:51 05/08/2012 16:17 05/08/2012 21:49 05/09/2012 07:27 05/09/2012 11:37  Glucose-Capillary Latest Range: 70-99 mg/dL 409 (H) 811 (H) 914 (H) 134 (H) 229 (H)   Inpatient Diabetes Program Recommendations Correction (SSI): Please consider increasing Nololog correction scale to moderate.  Note:Will continue to follow. Thanks, Orlando Penner, RN, BSN, CCRN Diabetes Coordinator Inpatient Diabetes Program [phone]

## 2012-05-09 NOTE — Consult Note (Signed)
Consult requested by: Dr. [name] Consult requested for abnormal chest CT:  HPI: This is a 71 year old came to the emergency room from Dr. Michelle Nasuti office because of shortness of breath. He says he's been coughing and coughing up some sputum. As part of his workup in the emergency department he underwent a CT of the chest and this shows multiple pulmonary nodules but also shows enlarged lymph nodes particularly in the neck. He denies any fever or chills. He has not coughed up any blood. He has no chest pain  Past Medical History  Diagnosis Date  . Hypertension   . Diabetes mellitus   . IDA (iron deficiency anemia)   . Vitamin B 12 deficiency   . Hyperlipidemia   . HTN (hypertension)   . Epistaxis   . Atrial fibrillation     Sees Dr Tonia Ghent, pt/wife unsure but believe this is why he is on coumadin  . PFO (patent foramen ovale)   . Cellulitis of left lower leg      Family History  Problem Relation Age of Onset  . Lung cancer Father   . Hypertension Mother      History   Social History  . Marital Status: Married    Spouse Name: N/A    Number of Children: N/A  . Years of Education: N/A   Occupational History  . retired    Social History Main Topics  . Smoking status: Never Smoker   . Smokeless tobacco: None  . Alcohol Use: No  . Drug Use: No  . Sexually Active: Yes   Other Topics Concern  . None   Social History Narrative   3 stepchildren     ROS: He has been coughing for several days. As mentioned he has had no chest pain or hemoptysis. He thinks his weight is about the same.    Objective: Vital signs in last 24 hours: Temp:  [97.4 F (36.3 C)-99.7 F (37.6 C)] 98 F (36.7 C) (11/12 0400) Pulse Rate:  [41-148] 148  (11/12 0600) Resp:  [18-35] 23  (11/12 0600) BP: (100-156)/(64-114) 146/102 mmHg (11/12 0600) SpO2:  [91 %-99 %] 95 % (11/12 0600) Weight:  [98.2 kg (216 lb 7.9 oz)-100.925 kg (222 lb 8 oz)] 98.2 kg (216 lb 7.9 oz) (11/12 0500) Weight  change:  Last BM Date: 05/08/12  Intake/Output from previous day: 11/11 0701 - 11/12 0700 In: 2552.3 [P.O.:720; I.V.:1832.3] Out: 4675 [Urine:4675]  PHYSICAL EXAM He is awake and alert. He is coughing almost continuously. His pupils are reactive. His nose and throat clear. His neck is supple. I don't feel any definite lymphadenopathy. His chest shows rhonchi bilaterally. His heart is irregular with atrial fibrillation. His abdomen is soft. Extremities showed no edema. Central nervous system exam is grossly intact  Lab Results: Basic Metabolic Panel:  Basename 05/08/12 1219  NA 140  K 4.2  CL 104  CO2 24  GLUCOSE 118*  BUN 18  CREATININE 0.82  CALCIUM 9.8  MG --  PHOS --   Liver Function Tests:  Basename 05/08/12 1219  AST 25  ALT 14  ALKPHOS 66  BILITOT 0.3  PROT 7.5  ALBUMIN 3.4*   No results found for this basename: LIPASE:2,AMYLASE:2 in the last 72 hours No results found for this basename: AMMONIA:2 in the last 72 hours CBC:  Basename 05/08/12 1219  WBC 10.3  NEUTROABS 6.1  HGB 13.7  HCT 41.9  MCV 94.4  PLT 413*   Cardiac Enzymes:  Basename 05/08/12 1219  CKTOTAL --  CKMB --  CKMBINDEX --  TROPONINI <0.30   BNP:  [facility] 05/08/12 1219  PROBNP 2767.0*   D-Dimer:  Basename 05/08/12 1219  DDIMER 0.50*   CBG:  Basename 05/09/12 0727 05/08/12 2149 05/08/12 1617  GLUCAP 134* 365* 101*   Hemoglobin A1C: No results found for this basename: HGBA1C in the last 72 hours Fasting Lipid Panel: No results found for this basename: CHOL,HDL,LDLCALC,TRIG,CHOLHDL,LDLDIRECT in the last 72 hours Thyroid Function Tests: No results found for this basename: TSH,T4TOTAL,FREET4,T3FREE,THYROIDAB in the last 72 hours Anemia Panel: No results found for this basename: VITAMINB12,FOLATE,FERRITIN,TIBC,IRON,RETICCTPCT in the last 72 hours Coagulation:  Basename 05/09/12 0428 05/08/12 1930  LABPROT 23.4* 21.4*  INR 2.19* 1.94*   Urine Drug Screen: Drugs of  Abuse  No results found for this basename: labopia, cocainscrnur, labbenz, amphetmu, thcu, labbarb    Alcohol Level: No results found for this basename: ETH:2 in the last 72 hours Urinalysis: No results found for this basename: COLORURINE:2,APPERANCEUR:2,LABSPEC:2,PHURINE:2,GLUCOSEU:2,HGBUR:2,BILIRUBINUR:2,KETONESUR:2,PROTEINUR:2,UROBILINOGEN:2,NITRITE:2,LEUKOCYTESUR:2 in the last 72 hours Misc. Labs:   ABGS: No results found for this basename: PHART,PCO2,PO2ART,TCO2,HCO3 in the last 72 hours   MICROBIOLOGY: Recent Results (from the past 240 hour(s))  MRSA PCR SCREENING     Status: Normal   Collection Time   05/08/12  4:18 PM      Component Value Range Status Comment   MRSA by PCR NEGATIVE  NEGATIVE Final     Studies/Results: Ct Angio Chest Pe W/cm &/or Wo Cm  05/08/2012  *RADIOLOGY REPORT*  Clinical Data: Shortness of breath.  Rule out pulmonary embolism.  CT ANGIOGRAPHY CHEST  Technique:  Multidetector CT imaging of the chest using the standard protocol during bolus administration of intravenous contrast. Multiplanar reconstructed images including MIPs were obtained and reviewed to evaluate the vascular anatomy.  Contrast: OMNIPAQUE IOHEXOL 350 MG/ML SOLN  Comparison: No priors.  Findings:  Mediastinum: No filling defects within the pulmonary arterial tree to suggest underlying pulmonary embolism.  Mild cardiomegaly. There is no significant pericardial fluid, thickening or pericardial calcification.   There are numerous prominent borderline enlarged mediastinal lymph nodes, none of which meets CT criteria for enlargement.  Likewise, there is prominent nodal tissue in the hilar regions bilaterally.  In the lower cervical regions there are bilateral enlarged lymph nodes measuring up to 14 mm in short axis on the left and 12 mm in short axis on the right (images 4 and 5 of series 4 respectively) in the inferior aspect of the level IV nodal stations.  Ectasia of the ascending thoracic  aorta (4.2 cm in diameter).  Lungs/Pleura: 7 mm subpleural ground-glass attenuation nodule in the periphery of the left upper lobe (image 27 of series 5).  A larger 1.8 x 1.6 cm focus of ground-glass attenuation in the left upper lobe (images 23 and 24 of series 5).  Background of other more ill-defined diffuse regions of ground-glass attenuation with associated interlobular septal thickening, favored to reflects some moderate pulmonary edema.  Associated with this there is mild diffuse thickening of the peribronchovascular interstitium.  Small left and moderate right-sided pleural effusions are simple in appearance and layer dependently.  Peripheral linear opacities and areas of architectural distortion in the lateral aspect of the left upper lobe and left lower lobe (deep to the some old healed left rib fractures), most compatible with areas of chronic scarring.  10 mm nodule in the medial aspect of the left lower lobe (image 70 of series 5).  Upper Abdomen: Unremarkable.  Musculoskeletal: Multiple old  healed left-sided rib fractures with mild post-traumatic deformity of the left chest wall laterally. There are no aggressive appearing lytic or blastic lesions noted in the visualized portions of the skeleton.  IMPRESSION: 1.  No evidence of pulmonary embolism.  2.  Appearance of the lungs suggest moderate pulmonary edema. There is also a moderate right and small left-sided pleural effusion layering dependently. 3.  Multiple pulmonary nodules, as above, largest of which is a 1 cm solid-appearing nodule in the medial aspect of the left lower lobe.  There is also a prominent 7 mm subpleural ground-glass attenuation nodule in the left upper lobe and another focal area of ground glass the central left upper lobe that measures 1.8 x 1.6 cm. Initial follow-up by chest CT without contrast is recommended in 3 months to confirm persistence.   This recommendation follows the consensus statement: Recommendations for the  Management of Subsolid Pulmonary Nodules Detected at CT:  A Statement from the Fleischner Society as published in Radiology 2013; 266:304-317. 4. Numerous borderline enlarged mediastinal and bilateral hilar lymph nodes may simply be reactive in the setting of congestive heart failure.  However, the enlarged lower bilateral cervical region nodes are of uncertain etiology and significance.  Clinical correlation for signs and symptoms of infection or malignancy in the head and neck is recommended, with consideration for further evaluation with contrast enhanced neck CT if of clinical concern.   Original Report Authenticated By: Trudie Reed, M.D.     Medications:  Scheduled:   . [COMPLETED] diltiazem (CARDIZEM) infusion  5 mg/hr Intravenous Once  . [COMPLETED] diltiazem  5 mg Intravenous Once  . doxazosin  8 mg Oral Daily  . [COMPLETED] furosemide  40 mg Oral Once  . gemfibrozil  600 mg Oral BID AC  . insulin aspart  0-9 Units Subcutaneous TID WC  . metFORMIN  1,000 mg Oral BID WC  . vitamin B-12  1,000 mcg Oral Daily  . warfarin  5 mg Oral q1800  . Warfarin - Physician Dosing Inpatient   Does not apply q1800  . [DISCONTINUED] furosemide  40 mg Intravenous Once   Continuous:   . sodium chloride 75 mL/hr at 05/09/12 0600  . diltiazem (CARDIZEM) infusion 15 mg/hr (05/09/12 0600)   ZOX:[id number], albuterol, [COMPLETED] iohexol, oxybutynin  Assesment: He has several areas of pulmonary nodularity that are of groundglass attenuation. He also has some lymph nodes in the cervical region but I cannot palpate them definitively. There are lymph nodes in the mediastinum and in the hilar area but they do not meet criteria for enlargement. Considering his severe cough and he may have an atypical pneumonia. I would treat him for that while he is being treated for CHF. He will need followup CT in 3 months to see if these areas have cleared. Depending on the clinical situation and the degree of suspicion  about malignancy he may need needle biopsy of one of the nodes in his neck. Otherwise he could have this reevaluated when he does his repeat chest CT in 3 months Active Problems:  * No active hospital problems. *     Plan: As above. Thanks for allowing me to see him with you    LOS: 1 day   Lorance Pickeral L 05/09/2012, 7:55 AM

## 2012-05-09 NOTE — Progress Notes (Signed)
Pt has had several runs of VT. Pt reports feeling fine, no complaints and sitting up in chair with family present. Dr [name] paged and made aware. Orders received for stat BMET and Magnesium. Will continue to monitor.
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
## 2012-05-09 NOTE — Progress Notes (Signed)
UR Chart Review Completed  

## 2012-05-09 NOTE — Progress Notes (Signed)
Dr Juanetta Gosling made aware that pt had CT with contrast on 11/11 and pt on Metformin. New orders received to hold Metformin. Pt updated.

## 2012-05-09 NOTE — Progress Notes (Signed)
*  PRELIMINARY RESULTS* Echocardiogram 2D Echocardiogram has been performed.  [name] 05/09/2012, 4:12 PM

## 2012-05-09 NOTE — Progress Notes (Signed)
NAMESRIYAN, TERP NO.:  [id number]  MEDICAL RECORD NO.:  [id number]  LOCATION:  IC01                          FACILITY:  [facility]  PHYSICIAN:  Mila Homer. [name], M.D.DATE OF BIRTH:  22-Mar-1941  DATE OF PROCEDURE: DATE OF DISCHARGE:                                PROGRESS NOTE   SUBJECTIVE:  He feels a little bit better today, but he was coughing more last night.  OBJECTIVE:  VITAL SIGNS:  Temperature is 98.2, pulse 91, respiratory rate 21, blood pressure 127/83. GENERAL:  He is semi-recumbent in bed.  His color is good.  His speech is normal.  He continues hard of hearing, HEART:  Irregular irregularity with a rate of about 80. LUNGS:  Clear throughout, but decreased breath sounds.  There are no intercostal retractions or use of accessory muscles of respiration. EXTREMITIES:  Area of erythema and swelling on the left distal leg about 2 inches superior to the left ankle.  This measures about an inch and a half in diameter and is a little bit warm.  His pro-B natriuretic peptide was 2767.  ASSESSMENT: 1. Dyspnea, probably of multifactorial etiology. 2. Mild congestive heart failure. 3. Possible atypical pneumonia. 4. Chronic atrial fibrillation. 5. Warfarin anticoagulation. 6. Erythema and swelling of the distal left leg, question etiology. 7. Mild pulmonary edema.  PLAN:  I reviewed the note from Dr. Juanetta Gosling, pulmonologist.  Continue with ceftriaxone and azithromycin IV.  Treat with furosemide for what is probably pulmonary edema.  Cardiology is due to see him today.     [facility]. [name], M.D.     SDK/MEDQ  D:  05/09/2012  T:  05/09/2012  Job:  [id number]

## 2012-05-10 ENCOUNTER — Inpatient Hospital Stay (HOSPITAL_COMMUNITY): Payer: Medicare Other

## 2012-05-10 LAB — MRSA PCR SCREENING: MRSA by PCR: NEGATIVE

## 2012-05-10 LAB — PROTIME-INR: INR: 2.23 — ABNORMAL HIGH (ref 0.00–1.49)

## 2012-05-10 LAB — BASIC METABOLIC PANEL
CO2: 29 mEq/L (ref 19–32)
Calcium: 9.4 mg/dL (ref 8.4–10.5)
GFR calc non Af Amer: 80 mL/min — ABNORMAL LOW (ref >90)
Glucose, Bld: 137 mg/dL — ABNORMAL HIGH (ref 70–99)
Potassium: 3.4 mEq/L — ABNORMAL LOW (ref 3.5–5.1)
Sodium: 141 mEq/L (ref 135–145)

## 2012-05-10 LAB — GLUCOSE, CAPILLARY
Glucose-Capillary: 228 mg/dL — ABNORMAL HIGH (ref 70–99)
Glucose-Capillary: 186 mg/dL — ABNORMAL HIGH (ref 70–99)

## 2012-05-10 LAB — PRO B NATRIURETIC PEPTIDE: Pro B Natriuretic peptide (BNP): 707.2 pg/mL — ABNORMAL HIGH (ref 0–125)

## 2012-05-10 MED ORDER — DEXTROSE 5 % IV SOLN
500.0000 mg | INTRAVENOUS | Status: AC
  Administered 2012-05-11 – 2012-05-12 (×2): 500 mg via INTRAVENOUS
  Filled 2012-05-10 (×3): qty 500

## 2012-05-10 MED ORDER — ZOLPIDEM TARTRATE 5 MG PO TABS: 5.0000 mg | ORAL_TABLET | Freq: Every evening | ORAL | Status: DC | PRN

## 2012-05-10 MED ORDER — METOPROLOL TARTRATE 25 MG PO TABS
25.0000 mg | ORAL_TABLET | Freq: Two times a day (BID) | ORAL | Status: DC
  Administered 2012-05-10 – 2012-05-11 (×3): 25 mg via ORAL
  Filled 2012-05-10 (×5): qty 1

## 2012-05-10 MED ORDER — ALPRAZOLAM 0.25 MG PO TABS: 0.2500 mg | ORAL_TABLET | Freq: Three times a day (TID) | ORAL | Status: DC | PRN

## 2012-05-10 MED ORDER — DIGOXIN 250 MCG PO TABS
0.2500 mg | ORAL_TABLET | Freq: Every day | ORAL | Status: DC
  Administered 2012-05-10 – 2012-05-16 (×7): 0.25 mg via ORAL
  Filled 2012-05-10 (×7): qty 1

## 2012-05-10 NOTE — Progress Notes (Signed)
Pt arrived on unit during 1700 hour, 2 L [place], NS and Cardizem Drip infusing.  Vital signs upon arrival on unit are documented in doc flow sheets.  Pt was alert and oriented without complaints of pain.  Pt exhibited SOB with a dry hacking cough with much exertion (consistant with report from [facility]).  Nurse will continue to monitor.

## 2012-05-10 NOTE — Progress Notes (Signed)
[facility]  DAILY PROGRESS NOTE   Subjective:  Improved with diuresis. Net 2.4 L negative. Weight down 8 lb. Denies orthopnea. No angina.  Objective:  Temp:  [97.4 F (36.3 C)-98.7 F (37.1 C)] 98.7 F (37.1 C) (11/13 1142) Pulse Rate:  [39-112] 52  (11/13 1230) Resp:  [16-30] 23  (11/13 1230) BP: (88-144)/(51-99) 111/69 mmHg (11/13 1230) SpO2:  [89 %-98 %] 95 % (11/13 1230) Weight:  [97.4 kg (214 lb 11.7 oz)] 97.4 kg (214 lb 11.7 oz) (11/13 0600) Weight change: -3.525 kg (-7 lb 12.4 oz)  Intake/Output from previous day: 11/12 0701 - 11/13 0700 In: 2708.8 [P.O.:1820; I.V.:588.8; IV Piggyback:300] Out: 4000 [Urine:4000]  Intake/Output from this shift: Total I/O In: 985 [P.O.:660; I.V.:75; IV Piggyback:250] Out: -   Medications: Current Facility-Administered Medications  Medication Dose Route Frequency Provider Last Rate Last Dose  . 0.9 %  sodium chloride infusion   Intravenous Continuous Milana Obey, MD 10 mL/hr at 05/10/12 1200    . acetaminophen (TYLENOL) tablet 500 mg  500 mg Oral Q4H PRN Milana Obey, MD      . albuterol (PROVENTIL HFA;VENTOLIN HFA) 108 (90 BASE) MCG/ACT inhaler 2 puff  2 puff Inhalation Q4H PRN Milana Obey, MD      . azithromycin (ZITHROMAX) 500 mg in dextrose 5 % 250 mL IVPB  500 mg Intravenous Q24H Fredirick Maudlin, MD   500 mg at 05/10/12 1100  . cefTRIAXone (ROCEPHIN) 1 g in dextrose 5 % 50 mL IVPB  1 g Intravenous Q24H Fredirick Maudlin, MD   1 g at 05/10/12 1000  . diltiazem (CARDIZEM) 100 mg in dextrose 5 % 100 mL infusion  5-15 mg/hr Intravenous Titrated Milana Obey, MD 5 mL/hr at 05/10/12 1200 5 mg/hr at 05/10/12 1200  . doxazosin (CARDURA) tablet 8 mg  8 mg Oral Daily Milana Obey, MD   8 mg at 05/10/12 1035  . furosemide (LASIX) tablet 40 mg  40 mg Oral Daily Milana Obey, MD   40 mg at 05/10/12 1035  . gemfibrozil (LOPID) tablet 600 mg  600 mg Oral BID AC Milana Obey,  MD   600 mg at 05/10/12 0835  . HYDROcodone-homatropine (HYCODAN) 5-1.5 MG/5ML syrup 5 mL  5 mL Oral Q4H PRN Fredirick Maudlin, MD   5 mL at 05/09/12 1339  . insulin aspart (novoLOG) injection 0-9 Units  0-9 Units Subcutaneous TID WC Milana Obey, MD   3 Units at 05/10/12 1222  . oxybutynin (DITROPAN) tablet 5 mg  5 mg Oral TID PRN Milana Obey, MD      . potassium chloride SA (K-DUR,KLOR-CON) CR tablet 20 mEq  20 mEq Oral BID Milana Obey, MD   20 mEq at 05/10/12 1035  . vitamin B-12 (CYANOCOBALAMIN) tablet 1,000 mcg  1,000 mcg Oral Daily Milana Obey, MD   1,000 mcg at 05/10/12 1035  . warfarin (COUMADIN) tablet 5 mg  5 mg Oral q1800 Milana Obey, MD   5 mg at 05/09/12 1710  . Warfarin - Physician Dosing Inpatient   Does not apply q1800 Milana Obey, MD        Physical Exam: General appearance: alert, cooperative and no distress Neck: JVD - 6 cm above sternal notch, no adenopathy, no carotid bruit, supple, symmetrical, trachea midline and thyroid not enlarged, symmetric, no tenderness/mass/nodules Lungs: clear to auscultation bilaterally Heart: irregularly irregular rhythm; possible S3, no murmurs Abdomen: soft,  non-tender; bowel sounds normal; no masses,  no organomegaly Extremities: extremities normal, atraumatic, no cyanosis or edema Pulses: 2+ and symmetric Skin: Skin color, texture, turgor normal. No rashes or lesions Neurologic: Grossly normal  Lab Results: Results for orders placed during the hospital encounter of 05/08/12 (from the past 48 hour(s))  GLUCOSE, CAPILLARY     Status: Abnormal   Collection Time   05/08/12  4:17 PM      Component Value Range Comment   Glucose-Capillary 101 (*) 70 - 99 mg/dL    Comment 1 Documented in Chart      Comment 2 Notify RN     MRSA PCR SCREENING     Status: Normal   Collection Time   05/08/12  4:18 PM      Component Value Range Comment   MRSA by PCR NEGATIVE  NEGATIVE   PROTIME-INR     Status: Abnormal    Collection Time   05/08/12  7:30 PM      Component Value Range Comment   Prothrombin Time 21.4 (*) 11.6 - 15.2 seconds    INR 1.94 (*) 0.00 - 1.49   GLUCOSE, CAPILLARY     Status: Abnormal   Collection Time   05/08/12  9:49 PM      Component Value Range Comment   Glucose-Capillary 365 (*) 70 - 99 mg/dL    Comment 1 Notify RN     PROTIME-INR     Status: Abnormal   Collection Time   05/09/12  4:28 AM      Component Value Range Comment   Prothrombin Time 23.4 (*) 11.6 - 15.2 seconds    INR 2.19 (*) 0.00 - 1.49   GLUCOSE, CAPILLARY     Status: Abnormal   Collection Time   05/09/12  7:27 AM      Component Value Range Comment   Glucose-Capillary 134 (*) 70 - 99 mg/dL   GLUCOSE, CAPILLARY     Status: Abnormal   Collection Time   05/09/12 11:37 AM      Component Value Range Comment   Glucose-Capillary 229 (*) 70 - 99 mg/dL    Comment 1 Documented in Chart      Comment 2 Notify RN     BASIC METABOLIC PANEL     Status: Abnormal   Collection Time   05/09/12  3:12 PM      Component Value Range Comment   Sodium 138  135 - 145 mEq/L    Potassium 3.4 (*) 3.5 - 5.1 mEq/L DELTA CHECK NOTED   Chloride 100  96 - 112 mEq/L    CO2 27  19 - 32 mEq/L    Glucose, Bld 249 (*) 70 - 99 mg/dL    BUN 15  6 - 23 mg/dL    Creatinine, Ser 1.61  0.50 - 1.35 mg/dL    Calcium 9.3  8.4 - 09.6 mg/dL    GFR calc non Af Amer 70 (*) >90 mL/min    GFR calc Af Amer 81 (*) >90 mL/min   MAGNESIUM     Status: Normal   Collection Time   05/09/12  3:12 PM      Component Value Range Comment   Magnesium 1.9  1.5 - 2.5 mg/dL   GLUCOSE, CAPILLARY     Status: Abnormal   Collection Time   05/09/12  4:42 PM      Component Value Range Comment   Glucose-Capillary 221 (*) 70 - 99 mg/dL    Comment 1 Documented in  Chart      Comment 2 Notify RN     GLUCOSE, CAPILLARY     Status: Abnormal   Collection Time   05/09/12  9:29 PM      Component Value Range Comment   Glucose-Capillary 145 (*) 70 - 99 mg/dL    Comment  1 Documented in Chart      Comment 2 Notify RN     PROTIME-INR     Status: Abnormal   Collection Time   05/10/12  5:04 AM      Component Value Range Comment   Prothrombin Time 23.7 (*) 11.6 - 15.2 seconds    INR 2.23 (*) 0.00 - 1.49   BASIC METABOLIC PANEL     Status: Abnormal   Collection Time   05/10/12  5:04 AM      Component Value Range Comment   Sodium 141  135 - 145 mEq/L    Potassium 3.4 (*) 3.5 - 5.1 mEq/L    Chloride 103  96 - 112 mEq/L    CO2 29  19 - 32 mEq/L    Glucose, Bld 137 (*) 70 - 99 mg/dL    BUN 14  6 - 23 mg/dL    Creatinine, Ser 1.61  0.50 - 1.35 mg/dL    Calcium 9.4  8.4 - 09.6 mg/dL    GFR calc non Af Amer 80 (*) >90 mL/min    GFR calc Af Amer >90  >90 mL/min   PRO B NATRIURETIC PEPTIDE     Status: Abnormal   Collection Time   05/10/12  5:04 AM      Component Value Range Comment   Pro B Natriuretic peptide (BNP) 707.2 (*) 0 - 125 pg/mL   GLUCOSE, CAPILLARY     Status: Abnormal   Collection Time   05/10/12  7:24 AM      Component Value Range Comment   Glucose-Capillary 130 (*) 70 - 99 mg/dL    Comment 1 Documented in Chart      Comment 2 Notify RN     GLUCOSE, CAPILLARY     Status: Abnormal   Collection Time   05/10/12 11:11 AM      Component Value Range Comment   Glucose-Capillary 225 (*) 70 - 99 mg/dL    Comment 1 Documented in Chart      Comment 2 Notify RN       Imaging: Dg Chest Port 1 View  05/10/2012  *RADIOLOGY REPORT*  Clinical Data: Cough, shortness of breath, hypertension  PORTABLE CHEST - 1 VIEW  Comparison: Chest x-ray of 05/05/2012 and CT chest of 05/08/2012  Findings: By recent CT of the chest, the opacity at the left lung base appears be due to a small left effusion and left basilar atelectasis.  Moderate cardiomegaly is stable.  No focal infiltrate or definite evidence of congestive heart failure is seen currently.  IMPRESSION: Stable small left pleural effusion.  Stable cardiomegaly.   Original Report Authenticated By: Dwyane Dee,  M.D.     Assessment:  1. Acute systolic heart failure and AF with RVR  Plan:  1. New onset CHF and LV systolic dysfunction. Coronary insufficiency needs to be excluded and a right and left heart cath is appropriate. However, reviewing his chest CT there is virtually no coronary calcification, making it more likely he has a non-ischemic cardiomyopathy (consider tachycardia-related). Catheterization will be delayed until INR <1.6 or so (prefer radial approach). 2. Will transition from diltiazem to oral beta blockers and digoxin.  3. Transfer to [facility]. Discussed with Dr. [name] and Mrs. Favia.  Time Spent Directly with Patient:  45 minutes  Length of Stay:  LOS: 2 days    Anwyn Kriegel 05/10/2012, 1:29 PM

## 2012-05-10 NOTE — Care Management Note (Signed)
    Page 1 of 2   05/15/2012     3:17:00 PM   CARE MANAGEMENT NOTE 05/15/2012  Patient:  Justin [name], Justin [name]   Account Number:  [id number]  Date Initiated:  05/10/2012  Documentation initiated by:  Sharrie Rothman  Subjective/Objective Assessment:   Pt admitted from home with a fib and pulmonary edema. Pt lives with his wife and will return home at discharge. Pt is independent with ADL's. No DME in the home.     Action/Plan:   Will follow for [facility] or DME needs.   Anticipated DC Date:  05/13/2012   Anticipated DC Plan:  HOME/SELF CARE      DC Planning Services  CM consult      [facility] Choice  HOME HEALTH   Choice offered to / List presented to:  C-3 Spouse   DME arranged  VEST - LIFE VEST      DME agency  OTHER - SEE NOTE     HH arranged  HH-1 RN  HH-10 DISEASE MANAGEMENT      HH agency  [facility]   Status of service:  Completed, signed off Medicare Important Message given?   (If response is "NO", the following Medicare IM given date fields will be blank) Date Medicare IM given:   Date Additional Medicare IM given:    Discharge Disposition:  HOME W HOME HEALTH SERVICES  Per UR Regulation:  Reviewed for med. necessity/level of care/duration of stay  If discussed at Long Length of Stay Meetings, dates discussed:    Comments:  05-15-12 [address] [place], [place] [phone] Pt will be fit for life vest this evening and plan for d/c tomorrow. CM did speak to pt and wife and they will benefit from [facility] for disease management and making sure life vest is appriately used. Life vest will be from [employer]. Pt will also be followed by [facility]. MD please write orders for [facility] services. CM did make referral for [facility] services with [facility]. SOC to begin within 24-48 hours post d/c.  05/10/12 1140 Arlyss Queen, RN BSN CM

## 2012-05-10 NOTE — Progress Notes (Signed)
[facility] on floor to transport pt to [facility] to room 2617.

## 2012-05-10 NOTE — Progress Notes (Signed)
Pt being transported to room 2617 per MD order via [facility]. Family aware. Report called to RN.

## 2012-05-10 NOTE — Progress Notes (Signed)
Report called to [facility]. Pt  And family members updated and made aware [facility] is on the way. Will continue to monitor.

## 2012-05-10 NOTE — Progress Notes (Signed)
He says he feels better and has not been coughing as much. He is on antibiotics for possible pneumonia but clearly has congestive heart failure as well. He does have lymph nodes which are at least prominent is not enlarged and that could go with infection. His echocardiogram shows reduction in his cardiac ejection fraction from one that was done about 18 months ago.  Exam shows he looks much more comfortable. His chest is clear with them before. He is still in atrial fibrillation but his heart rate is better to know about 100 his blood pressure is okay at 116/57.  It appears that the primary problem is his congestive heart failure. He has been evaluated by his cardiologist and his primary cardiologist apparently will see him today.  Because the primary problem appears to be cardiac I will plan to follow more peripherally. He could potentially be switched to oral antibiotics.

## 2012-05-10 NOTE — Progress Notes (Signed)
Inpatient Diabetes Program Recommendations  AACE/ADA: New Consensus Statement on Inpatient Glycemic Control (2013)  Target Ranges:  Prepandial:   less than 140 mg/dL      Peak postprandial:   less than 180 mg/dL (1-2 hours)      Critically ill patients:  140 - 180 mg/dL   Results for CHAYTON, MURATA (MRN [id number]) as of 05/10/2012 08:29  Ref. Range 05/09/2012 07:27 05/09/2012 11:37 05/09/2012 16:42 05/09/2012 21:29 05/10/2012 07:24  Glucose-Capillary Latest Range: 70-99 mg/dL 086 (H) 578 (H) 469 (H) 145 (H) 130 (H)    Inpatient Diabetes Program Recommendations Correction (SSI): Please consider increasing Nololog correction scale to moderate.  Note: Will continue to follow.   Thanks, Orlando Penner, RN, BSN, CCRN Diabetes Coordinator Inpatient Diabetes Program [phone]

## 2012-05-11 LAB — BASIC METABOLIC PANEL
Calcium: 9 mg/dL (ref 8.4–10.5)
GFR calc Af Amer: >90 mL/min (ref >90)
GFR calc non Af Amer: 81 mL/min — ABNORMAL LOW (ref >90)
Potassium: 3.8 mEq/L (ref 3.5–5.1)
Sodium: 137 mEq/L (ref 135–145)

## 2012-05-11 LAB — GLUCOSE, CAPILLARY
Glucose-Capillary: 198 mg/dL — ABNORMAL HIGH (ref 70–99)
Glucose-Capillary: 210 mg/dL — ABNORMAL HIGH (ref 70–99)
Glucose-Capillary: 176 mg/dL — ABNORMAL HIGH (ref 70–99)
Glucose-Capillary: 209 mg/dL — ABNORMAL HIGH (ref 70–99)

## 2012-05-11 LAB — PROTIME-INR: INR: 2.01 — ABNORMAL HIGH (ref 0.00–1.49)

## 2012-05-11 MED ORDER — LISINOPRIL 2.5 MG PO TABS
2.5000 mg | ORAL_TABLET | Freq: Every day | ORAL | Status: DC
  Administered 2012-05-13 – 2012-05-16 (×4): 2.5 mg via ORAL
  Filled 2012-05-11 (×8): qty 1

## 2012-05-11 MED ORDER — PHYTONADIONE 1 MG/0.5 ML ORAL SOLUTION
1.0000 mg | Freq: Once | ORAL | Status: AC
  Administered 2012-05-11: 1 mg via ORAL
  Filled 2012-05-11 (×2): qty 0.5

## 2012-05-11 NOTE — Progress Notes (Cosign Needed)
NAMEBERLIE, HATCHEL NO.:  [id number]  MEDICAL RECORD NO.:  [id number]  LOCATION:  2617                         FACILITY:  [facility]  PHYSICIAN:  Mila Homer. [name], M.D.DATE OF BIRTH:  1940/12/15  DATE OF PROCEDURE: DATE OF DISCHARGE:                                PROGRESS NOTE   SUBJECTIVE:  He is feeling much better.  In fact, slept through the night last night without coughing.  He complains of some pain in the dorsum of the right foot.  OBJECTIVE:  VITAL SIGNS:  Temperature is 98 degrees, pulse 47, respiratory rate 20, blood pressure 130/82. GENERAL:  He is sitting up in bed.  His color is good.  It.  His speech is normal. LUNGS:  Show decreased breath sounds throughout, but he is moving air well without intercostal retraction.  No use of accessory muscles of respiration. HEART:  An irregular irregularity.  Rate of about 80. EXTREMITIES:  He has no edema of the feet.  The area of swelling in the distal left leg is much better.  There is no abnormality in the dorsum of the right foot.  His BMP shows a potassium of 3.4, and his BNP is now 707 down from 2767 initially.  ASSESSMENT: 1. Dyspnea probably secondary to pulmonary edema. 2. Pulmonary edema.  I think that he had a viral infection resulting     in increased cardiac output, tachycardia, and because of his atrial     fibrillation backup of fluid in his lung. 3. Question atypical bacterial infection.  This would have had to be a     bronchitis since pneumonia does not show up on chest x-ray or CT     scan of the lung. 4. Chronic atrial fibrillation. 5. Warfarin anticoagulation. 6. Erythema of the distal left leg related to his hematoma of the leg     now improved. 7. Hypokalemia.  PLAN:  He is due to see his cardiologist today.  He probably can be switched to diltiazem p.o.  Chest x-ray is pending.  I reviewed his 2D echo, done yesterday, which showed moderate dilatation of the left ventricle  and mild concentric hypertrophy with systolic function reduced to 25%-30% ejection fraction and moderate to severe anteroapical hypokinesis.  This does suggest a recent coronary event.  I will discuss with Cardiology.     Mila Homer. [name], M.D.     SDK/MEDQ  D:  05/10/2012  T:  05/11/2012  Job:  [id number]

## 2012-05-11 NOTE — Discharge Summary (Signed)
Justin [name], [name] NO.:  [id number]  MEDICAL RECORD NO.:  [id number]  LOCATION:                                 FACILITY:  PHYSICIAN:  Mila Homer. [name], M.D.DATE OF BIRTH:  1940-10-18  DATE OF ADMISSION:  05/08/2012 DATE OF DISCHARGE:  LH                              DISCHARGE SUMMARY   CSN #:  [id number].  HOSPITAL COURSE:  This 71 year old was admitted to the hospital on May 08, 2012 with persistent dyspnea.  He had a 3 day hospitalization.  He was discharged today on May 10, 2012.  He had been dyspneic for at least a week.  He had symptoms of bronchitis and had been on antibiotics, but was not getting better.  His chest x- ray done 3 days prior to admission showed no acute changes, and his white cell count was minimally elevated.  He had been treated on azithromycin by mouth, and albuterol inhaler.  In the hospital, his white cell count was 10,300 with a normal differential.  His CMP was essentially normal except for glucose of 249 and albumin of 3.4.  His INR was 2.01. CT angio of the chest showed no pulmonary emboli, but moderate pulmonary edema and a moderate right and small left-sided pleural effusion.  He also had multiple pulmonary nodules, the largest of which was 1 cm and solid-appearing.  In addition, he had numerous borderline enlarged mediastinal bilateral hilar lymph nodes.  Chest x-ray done 3 days prior to his admission showed enlargement of the cardiac silhouette and mild chronic bronchitic changes, the left basilar scarring and a small hiatal hernia but no acute abnormalities, as noted above.  Repeat chest x-ray on admission showed stable small left pleural effusion and stable cardiomegaly.  Cardiac monitor showed persistent atrial fibrillation with rapid ventricular response and a heart rate of 137 later on his 1st day. He had a 2D echo done his 2nd day.  This showed the cavity size of the left ventricle moderately  dilated, and mild concentric hypertrophy.  The systolic function was severely reduced with an estimated ejection fraction of 25-30%.  There was moderate to severe anteroapical hypokinesis and inferior  hypokinesis with an apical/septal hinge point which was concerning to the radiologist for possible multi-vessel CAD. The left atrium was severely dilated.  He was admitted to the hospital.  He was on IV fluids.  He was put on IV ceftriaxone and azithromycin.  He was kept on warfarin and other outpatient meds.  He was given furosemide.  His 2nd night, he slept the night through and felt much better. I discussed with his cardiologist, Dr. Royann Shivers with the results of the echocardiogram.  It was felt that his primary problem was pulmonary edema secondary to systolic congestive heart failure.  Given that he was at risk for coronary artery disease, with his diabetes and given the results of the echo, it was felt to be prudent to transfer him from [facility] to [facility], on his 3rd day and then to have a cardiac catheterization done within 2 days after discharge from [facility], as long as his INR was acceptable.  FINAL DISCHARGE DIAGNOSES: 1. Systolic congestive heart failure. 2. Chronic atrial fibrillation. 3. Presumptive native vessel coronary artery disease. 4. Warfarin anticoagulation. 5. Diabetes. 6. Possible atypical pneumonia/bronchitis. 7. Benign essential hypertension.  PLAN:  He was transferred to [facility] was as mentioned above, with medication adjustment to be done by his cardiologist, Dr. Royann Shivers.     Mila Homer. [name], M.D.     SDK/MEDQ  D:  05/11/2012  T:  05/11/2012  Job:  [id number]

## 2012-05-11 NOTE — Progress Notes (Signed)
Inpatient Diabetes Program Recommendations  AACE/ADA: New Consensus Statement on Inpatient Glycemic Control (2013)  Target Ranges:  Prepandial:   less than 140 mg/dL      Peak postprandial:   less than 180 mg/dL (1-2 hours)      Critically ill patients:  140 - 180 mg/dL   Reason for Visit: Hyperglycemia  71 year old male admitted with SOB, Hx DM.  On metformin 1000 mg bid at home. Results for LEMON, STERNBERG (MRN [id number]) as of 05/11/2012 09:27  Ref. Range 05/11/2012 05:10  Sodium Latest Range: 137-147 mmol/L 137  Potassium Latest Range: 3.5-5.1 mEq/L 3.8  Chloride Latest Range: 96-112 mEq/L 102  CO2 Latest Range: 19-32 mEq/L 27  BUN Latest Range: 4-21 mg/dL 18  Creatinine Latest Range: 0.50-1.35 mg/dL 6.29  Calcium Latest Range: 8.4-10.5 mg/dL 9.0  GFR calc non Af Amer Latest Range: >90 mL/min 81 (L)  GFR calc Af Amer Latest Range: >90 mL/min >90  Glucose Latest Range: 70-99 mg/dL 528 (H)  Results for KAINALU, HEGGS (MRN [id number]) as of 05/11/2012 09:27  Ref. Range 05/10/2012 18:52 05/10/2012 20:15 05/10/2012 22:25 05/11/2012 07:45  Glucose-Capillary Latest Range: 70-99 mg/dL 272 (H) 536 (H) 644 (H) 198 (H)   Results for DACOTA, RUBEN (MRN [id number]) as of 05/11/2012 09:27  Ref. Range 11/18/2010 10:47  Hemoglobin A1C Latest Range: 4.0-6.0 % 7.0 (A)    Inpatient Diabetes Program Recommendations Correction (SSI): Please consider increasing Novolog correction scale to moderate. HgbA1C: Updated HgbA1C to assess glycemic control prior to hospitalization  Note: May need low dose basal insulin if FBS remains <180 mg/dL.

## 2012-05-11 NOTE — Progress Notes (Signed)
[facility]  DAILY PROGRESS NOTE   Subjective:  Feels well, no dyspnea. Ventricular rate consistently in the 70s.  Objective:  Temp:  [97.5 F (36.4 C)-98.7 F (37.1 C)] 97.5 F (36.4 C) (11/14 0741) Pulse Rate:  [52-123] 74  (11/14 0741) Resp:  [18-31] 23  (11/14 0741) BP: (89-144)/(54-93) 106/71 mmHg (11/14 0741) SpO2:  [93 %-98 %] 94 % (11/14 0741) Weight:  [99.4 kg (219 lb 2.2 oz)] 99.4 kg (219 lb 2.2 oz) (11/14 0741) Weight change:   Intake/Output from previous day: 11/13 0701 - 11/14 0700 In: 1759.7 [P.O.:1050; I.V.:409.7; IV Piggyback:300] Out: 2400 [Urine:2400]  Intake/Output from this shift: Total I/O In: 240 [P.O.:240] Out: -   Medications: Current Facility-Administered Medications  Medication Dose Route Frequency Provider Last Rate Last Dose  . 0.9 %  sodium chloride infusion   Intravenous Continuous Milana Obey, MD 10 mL/hr at 05/11/12 0037    . acetaminophen (TYLENOL) tablet 500 mg  500 mg Oral Q4H PRN Milana Obey, MD      . albuterol (PROVENTIL HFA;VENTOLIN HFA) 108 (90 BASE) MCG/ACT inhaler 2 puff  2 puff Inhalation Q4H PRN Milana Obey, MD      . ALPRAZolam Prudy Feeler) tablet 0.25 mg  0.25 mg Oral TID PRN Abelino Derrick, PA      . azithromycin (ZITHROMAX) 500 mg in dextrose 5 % 250 mL IVPB  500 mg Intravenous Q24H Yonatan Guitron, MD      . digoxin (LANOXIN) tablet 0.25 mg  0.25 mg Oral Daily Teauna Dubach, MD   0.25 mg at 05/10/12 1924  . doxazosin (CARDURA) tablet 8 mg  8 mg Oral Daily Milana Obey, MD   8 mg at 05/10/12 1035  . furosemide (LASIX) tablet 40 mg  40 mg Oral Daily Milana Obey, MD   40 mg at 05/10/12 1035  . gemfibrozil (LOPID) tablet 600 mg  600 mg Oral BID AC Milana Obey, MD   600 mg at 05/10/12 1924  . HYDROcodone-homatropine (HYCODAN) 5-1.5 MG/5ML syrup 5 mL  5 mL Oral Q4H PRN Fredirick Maudlin, MD   5 mL at 05/09/12 1339  . insulin aspart (novoLOG) injection 0-9 Units  0-9  Units Subcutaneous TID WC Milana Obey, MD   5 Units at 05/10/12 1925  . lisinopril (PRINIVIL,ZESTRIL) tablet 2.5 mg  2.5 mg Oral Daily Fraidy Mccarrick, MD      . metoprolol tartrate (LOPRESSOR) tablet 25 mg  25 mg Oral BID Thurmon Fair, MD   25 mg at 05/10/12 2217  . oxybutynin (DITROPAN) tablet 5 mg  5 mg Oral TID PRN Milana Obey, MD      . potassium chloride SA (K-DUR,KLOR-CON) CR tablet 20 mEq  20 mEq Oral BID Milana Obey, MD   20 mEq at 05/10/12 2217  . vitamin B-12 (CYANOCOBALAMIN) tablet 1,000 mcg  1,000 mcg Oral Daily Milana Obey, MD   1,000 mcg at 05/10/12 1035  . zolpidem (AMBIEN) tablet 5 mg  5 mg Oral QHS PRN Abelino Derrick, PA      . [DISCONTINUED] azithromycin (ZITHROMAX) 500 mg in dextrose 5 % 250 mL IVPB  500 mg Intravenous Q24H Fredirick Maudlin, MD   500 mg at 05/10/12 1100  . [DISCONTINUED] cefTRIAXone (ROCEPHIN) 1 g in dextrose 5 % 50 mL IVPB  1 g Intravenous Q24H Fredirick Maudlin, MD   1 g at 05/10/12 1000  . [DISCONTINUED] diltiazem (CARDIZEM) 100 mg  in dextrose 5 % 100 mL infusion  5-15 mg/hr Intravenous Titrated Milana Obey, MD 10 mL/hr at 05/11/12 0037 10 mg/hr at 05/11/12 0037  . [DISCONTINUED] warfarin (COUMADIN) tablet 5 mg  5 mg Oral q1800 Milana Obey, MD   5 mg at 05/09/12 1710  . [DISCONTINUED] Warfarin - Physician Dosing Inpatient   Does not apply q1800 Milana Obey, MD        Physical Exam: General appearance: alert, cooperative and no distress Neck: no adenopathy, no carotid bruit, no JVD, supple, symmetrical, trachea midline and thyroid not enlarged, symmetric, no tenderness/mass/nodules Lungs: clear to auscultation bilaterally Heart: irregularly irregular rhythm and S1, S2 normal Abdomen: soft, non-tender; bowel sounds normal; no masses,  no organomegaly Extremities: extremities normal, atraumatic, no cyanosis or edema and left anterior shin dusky red and minimally swollen in area of resolving  hematoma/cellulitis Pulses: 2+ and symmetric Skin: Skin color, texture, turgor normal. No rashes or lesions or except as described above Neurologic: Grossly normal Strabismus  Lab Results: Results for orders placed during the hospital encounter of 05/08/12 (from the past 48 hour(s))  GLUCOSE, CAPILLARY     Status: Abnormal   Collection Time   05/09/12 11:37 AM      Component Value Range Comment   Glucose-Capillary 229 (*) 70 - 99 mg/dL    Comment 1 Documented in Chart      Comment 2 Notify RN     BASIC METABOLIC PANEL     Status: Abnormal   Collection Time   05/09/12  3:12 PM      Component Value Range Comment   Sodium 138  135 - 145 mEq/L    Potassium 3.4 (*) 3.5 - 5.1 mEq/L DELTA CHECK NOTED   Chloride 100  96 - 112 mEq/L    CO2 27  19 - 32 mEq/L    Glucose, Bld 249 (*) 70 - 99 mg/dL    BUN 15  6 - 23 mg/dL    Creatinine, Ser 1.61  0.50 - 1.35 mg/dL    Calcium 9.3  8.4 - 09.6 mg/dL    GFR calc non Af Amer 70 (*) >90 mL/min    GFR calc Af Amer 81 (*) >90 mL/min   MAGNESIUM     Status: Normal   Collection Time   05/09/12  3:12 PM      Component Value Range Comment   Magnesium 1.9  1.5 - 2.5 mg/dL   GLUCOSE, CAPILLARY     Status: Abnormal   Collection Time   05/09/12  4:42 PM      Component Value Range Comment   Glucose-Capillary 221 (*) 70 - 99 mg/dL    Comment 1 Documented in Chart      Comment 2 Notify RN     GLUCOSE, CAPILLARY     Status: Abnormal   Collection Time   05/09/12  9:29 PM      Component Value Range Comment   Glucose-Capillary 145 (*) 70 - 99 mg/dL    Comment 1 Documented in Chart      Comment 2 Notify RN     PROTIME-INR     Status: Abnormal   Collection Time   05/10/12  5:04 AM      Component Value Range Comment   Prothrombin Time 23.7 (*) 11.6 - 15.2 seconds    INR 2.23 (*) 0.00 - 1.49   BASIC METABOLIC PANEL     Status: Abnormal   Collection Time   05/10/12  5:04  AM      Component Value Range Comment   Sodium 141  135 - 145 mEq/L     Potassium 3.4 (*) 3.5 - 5.1 mEq/L    Chloride 103  96 - 112 mEq/L    CO2 29  19 - 32 mEq/L    Glucose, Bld 137 (*) 70 - 99 mg/dL    BUN 14  6 - 23 mg/dL    Creatinine, Ser 1.61  0.50 - 1.35 mg/dL    Calcium 9.4  8.4 - 09.6 mg/dL    GFR calc non Af Amer 80 (*) >90 mL/min    GFR calc Af Amer >90  >90 mL/min   PRO B NATRIURETIC PEPTIDE     Status: Abnormal   Collection Time   05/10/12  5:04 AM      Component Value Range Comment   Pro B Natriuretic peptide (BNP) 707.2 (*) 0 - 125 pg/mL   GLUCOSE, CAPILLARY     Status: Abnormal   Collection Time   05/10/12  7:24 AM      Component Value Range Comment   Glucose-Capillary 130 (*) 70 - 99 mg/dL    Comment 1 Documented in Chart      Comment 2 Notify RN     GLUCOSE, CAPILLARY     Status: Abnormal   Collection Time   05/10/12 11:11 AM      Component Value Range Comment   Glucose-Capillary 225 (*) 70 - 99 mg/dL    Comment 1 Documented in Chart      Comment 2 Notify RN     MRSA PCR SCREENING     Status: Normal   Collection Time   05/10/12  6:07 PM      Component Value Range Comment   MRSA by PCR NEGATIVE  NEGATIVE   GLUCOSE, CAPILLARY     Status: Abnormal   Collection Time   05/10/12  6:52 PM      Component Value Range Comment   Glucose-Capillary 261 (*) 70 - 99 mg/dL   GLUCOSE, CAPILLARY     Status: Abnormal   Collection Time   05/10/12  8:15 PM      Component Value Range Comment   Glucose-Capillary 228 (*) 70 - 99 mg/dL   GLUCOSE, CAPILLARY     Status: Abnormal   Collection Time   05/10/12 10:25 PM      Component Value Range Comment   Glucose-Capillary 186 (*) 70 - 99 mg/dL   PROTIME-INR     Status: Abnormal   Collection Time   05/11/12  5:10 AM      Component Value Range Comment   Prothrombin Time 22.0 (*) 11.6 - 15.2 seconds    INR 2.01 (*) 0.00 - 1.49   BASIC METABOLIC PANEL     Status: Abnormal   Collection Time   05/11/12  5:10 AM      Component Value Range Comment   Sodium 137  135 - 145 mEq/L    Potassium 3.8   3.5 - 5.1 mEq/L    Chloride 102  96 - 112 mEq/L    CO2 27  19 - 32 mEq/L    Glucose, Bld 249 (*) 70 - 99 mg/dL    BUN 18  6 - 23 mg/dL    Creatinine, Ser 0.45  0.50 - 1.35 mg/dL    Calcium 9.0  8.4 - 40.9 mg/dL    GFR calc non Af Amer 81 (*) >90 mL/min    GFR calc Af  Amer >90  >90 mL/min   GLUCOSE, CAPILLARY     Status: Abnormal   Collection Time   05/11/12  7:45 AM      Component Value Range Comment   Glucose-Capillary 198 (*) 70 - 99 mg/dL    Comment 1 Documented in Chart      Comment 2 Notify RN       Imaging: Dg Chest Port 1 View  05/10/2012  *RADIOLOGY REPORT*  Clinical Data: Cough, shortness of breath, hypertension  PORTABLE CHEST - 1 VIEW  Comparison: Chest x-ray of 05/05/2012 and CT chest of 05/08/2012  Findings: By recent CT of the chest, the opacity at the left lung base appears be due to a small left effusion and left basilar atelectasis.  Moderate cardiomegaly is stable.  No focal infiltrate or definite evidence of congestive heart failure is seen currently.  IMPRESSION: Stable small left pleural effusion.  Stable cardiomegaly.   Original Report Authenticated By: Dwyane Dee, M.D.     Assessment:  1. Active Problems: 2.  * No active hospital problems. *  3. AF with RVR 4. Acute systolic HF, new onset  Plan:  1. Stop diltiazem IV 2. Small dose vitamin K, hope to have INR<1.6 and cath tomorrow 3. Continue dig and beta blocker for AF and CHF 4. Low dose diuretic, he is probably euvolemic 5. Add ACEi - low dose as BP low  Time Spent Directly with Patient:  30  minutes  Length of Stay:  LOS: 3 days    Justin [name] 05/11/2012, 9:34 AM

## 2012-05-12 ENCOUNTER — Encounter (HOSPITAL_COMMUNITY)
Admission: EM | Disposition: A | Discharge status: Home Health | Payer: Self-pay | Source: Home / Self Care | Attending: Cardiovascular Disease

## 2012-05-12 HISTORY — PX: LEFT HEART CATHETERIZATION WITH CORONARY ANGIOGRAM: SHX5451

## 2012-05-12 LAB — PROTIME-INR
INR: 1.51 — ABNORMAL HIGH (ref 0.00–1.49)
Prothrombin Time: 17.8 seconds — ABNORMAL HIGH (ref 11.6–15.2)

## 2012-05-12 LAB — BASIC METABOLIC PANEL
BUN: 17 mg/dL (ref 6–23)
CO2: 27 mEq/L (ref 19–32)
Calcium: 9.4 mg/dL (ref 8.4–10.5)
GFR calc non Af Amer: 82 mL/min — ABNORMAL LOW (ref >90)
Glucose, Bld: 157 mg/dL — ABNORMAL HIGH (ref 70–99)
Potassium: 3.5 mEq/L (ref 3.5–5.1)

## 2012-05-12 LAB — GLUCOSE, CAPILLARY
Glucose-Capillary: 133 mg/dL — ABNORMAL HIGH (ref 70–99)
Glucose-Capillary: 146 mg/dL — ABNORMAL HIGH (ref 70–99)

## 2012-05-12 SURGERY — LEFT HEART CATHETERIZATION WITH CORONARY ANGIOGRAM
Anesthesia: LOCAL

## 2012-05-12 MED ORDER — SODIUM CHLORIDE 0.9 % IV SOLN: 1.0000 mL/kg/h | INTRAVENOUS | Status: DC

## 2012-05-12 MED ORDER — HEPARIN (PORCINE) IN NACL 2-0.9 UNIT/ML-% IJ SOLN
INTRAMUSCULAR | Status: AC
  Filled 2012-05-12: qty 1500

## 2012-05-12 MED ORDER — SODIUM CHLORIDE 0.9 % IJ SOLN
3.0000 mL | Freq: Two times a day (BID) | INTRAMUSCULAR | Status: DC
  Administered 2012-05-12: 3 mL via INTRAVENOUS

## 2012-05-12 MED ORDER — WARFARIN SODIUM 7.5 MG PO TABS
7.5000 mg | ORAL_TABLET | Freq: Once | ORAL | Status: AC
  Administered 2012-05-12: 7.5 mg via ORAL
  Filled 2012-05-12: qty 1

## 2012-05-12 MED ORDER — SODIUM CHLORIDE 0.9 % IV SOLN
1.0000 mL/kg/h | INTRAVENOUS | Status: AC
  Administered 2012-05-12: 18:00:00 1 mL/kg/h via INTRAVENOUS

## 2012-05-12 MED ORDER — SODIUM CHLORIDE 0.9 % IV SOLN: 250.0000 mL | INTRAVENOUS | Status: DC

## 2012-05-12 MED ORDER — ONDANSETRON HCL 4 MG/2ML IJ SOLN: 4.0000 mg | Freq: Four times a day (QID) | INTRAMUSCULAR | Status: DC | PRN

## 2012-05-12 MED ORDER — LIDOCAINE HCL (PF) 1 % IJ SOLN
INTRAMUSCULAR | Status: AC
  Filled 2012-05-12: qty 30

## 2012-05-12 MED ORDER — VERAPAMIL HCL 2.5 MG/ML IV SOLN
INTRAVENOUS | Status: AC
  Filled 2012-05-12: qty 2

## 2012-05-12 MED ORDER — SODIUM CHLORIDE 0.9 % IJ SOLN: 3.0000 mL | INTRAMUSCULAR | Status: DC | PRN

## 2012-05-12 MED ORDER — WARFARIN - PHARMACIST DOSING INPATIENT
Freq: Every day | Status: DC
  Administered 2012-05-13: 17:00:00
  Administered 2012-05-15: 1

## 2012-05-12 MED ORDER — FENTANYL CITRATE 0.05 MG/ML IJ SOLN
INTRAMUSCULAR | Status: AC
  Filled 2012-05-12: qty 2

## 2012-05-12 MED ORDER — ACETAMINOPHEN 325 MG PO TABS: 650.0000 mg | ORAL_TABLET | ORAL | Status: DC | PRN

## 2012-05-12 MED ORDER — HEPARIN SODIUM (PORCINE) 1000 UNIT/ML IJ SOLN
INTRAMUSCULAR | Status: AC
  Filled 2012-05-12: qty 1

## 2012-05-12 MED ORDER — HEPARIN (PORCINE) IN NACL 100-0.45 UNIT/ML-% IJ SOLN
1350.0000 [IU]/h | INTRAMUSCULAR | Status: DC
  Filled 2012-05-12 (×2): qty 250

## 2012-05-12 MED ORDER — OXYCODONE-ACETAMINOPHEN 5-325 MG PO TABS
1.0000 | ORAL_TABLET | ORAL | Status: DC | PRN
  Administered 2012-05-12 – 2012-05-13 (×2): 1 via ORAL
  Filled 2012-05-12 (×2): qty 1

## 2012-05-12 MED ORDER — NITROGLYCERIN 0.2 MG/ML ON CALL CATH LAB
INTRAVENOUS | Status: AC
  Filled 2012-05-12: qty 1

## 2012-05-12 MED ORDER — ASPIRIN 81 MG PO CHEW
324.0000 mg | CHEWABLE_TABLET | ORAL | Status: AC
  Administered 2012-05-12: 324 mg via ORAL
  Filled 2012-05-12: qty 1
  Filled 2012-05-12: qty 3
  Filled 2012-05-12: qty 1

## 2012-05-12 MED ORDER — HEPARIN (PORCINE) IN NACL 100-0.45 UNIT/ML-% IJ SOLN
2200.0000 [IU]/h | INTRAMUSCULAR | Status: DC
  Administered 2012-05-13: 2000 [IU]/h via INTRAVENOUS
  Administered 2012-05-13: 1350 [IU]/h via INTRAVENOUS
  Administered 2012-05-14: 2000 [IU]/h via INTRAVENOUS
  Administered 2012-05-15: 2300 [IU]/h via INTRAVENOUS
  Administered 2012-05-15: 2200 [IU]/h via INTRAVENOUS
  Filled 2012-05-12 (×10): qty 250

## 2012-05-12 MED ORDER — SODIUM CHLORIDE 0.9 % IJ SOLN: 3.0000 mL | Freq: Two times a day (BID) | INTRAMUSCULAR | Status: DC

## 2012-05-12 MED ORDER — METOPROLOL TARTRATE 50 MG PO TABS
50.0000 mg | ORAL_TABLET | Freq: Two times a day (BID) | ORAL | Status: DC
  Administered 2012-05-12: 50 mg via ORAL
  Filled 2012-05-12 (×2): qty 1

## 2012-05-12 MED ORDER — SODIUM CHLORIDE 0.9 % IV SOLN: 250.0000 mL | INTRAVENOUS | Status: DC | PRN

## 2012-05-12 NOTE — Progress Notes (Signed)
Patient evaluated for long-term disease management services with [facility] Care Management Program as benefit of his [employer]. Spoke with patient and wife at bedside to explain services and that [facility] Care Management services do not interfere or replace services that may be arranged by inpatient case management. Patient will receive a post discharge transition of care call and will be evaluated for monthly home visits for assessments and for education.  [facility] Care Management literature and contact information with wife. Both patient and wife appreciative of visit.  Raiford Noble, RN,BSN, MSN [facility] Liaison [phone]

## 2012-05-12 NOTE — CV Procedure (Signed)
Justin [name], Justin [name] Male, 71 y.o., May 12, 1941  Location: [facility]CATH LAB  Bed: NONE  MRN: [id number]  CSN: [id number]  Admit Dt: 05/08/12    05/12/2012  CARDIAC CATHETERIZATION REPORT   Procedures performed:  1. Left heart catheterization  2. Selective coronary angiography  3. Left ventriculography   Reason for procedure:  Congestive heart failure, new onset  Procedure performed by: Thurmon Fair, MD, [facility]  Complications: none   Estimated blood loss: less than 5 mL   History:  71 year old man with permanent atrial fibrillation presents with acute pulmonary edema and new deterioration in LV systolic function.  Consent: The risks, benefits, and details of the procedure were explained to the patient. Risks including death, MI, stroke, bleeding, limb ischemia, renal failure and allergy were described and accepted by the patient. Informed written consent was obtained prior to proceeding.  Technique: The patient was brought to the cardiac catheterization laboratory in the fasting state. He was prepped and draped in the usual sterile fashion. Local anesthesia with 1% lidocaine was administered to the right wrist area. Using the modified Seldinger technique a 5 French right radial artery sheath was introduced without difficulty. Under fluoroscopic guidance, using 5 [place] TIG, JR and angled pigtail catheters, selective cannulation of the left coronary artery, right coronary artery and left ventricle were respectively performed. Several coronary angiograms in a variety of projections were recorded, as well as a left ventriculogram in the RAO projection. Left ventricular pressure and a pull back to the aorta were recorded. No immediate complications occurred. At the end of the procedure, all catheters were removed. After the procedure, hemostasis will be achieved with manual pressure.  Contrast used: 85 mL Omnipaque  Angiographic Findings:  1. The left main coronary artery is free of significant  atherosclerosis and bifurcates in the usual fashion into the left anterior descending artery and left circumflex coronary artery.  2. The left anterior descending artery is a large vessel that reaches the apex and generates two major diagonal branches and a very large septal artery. There is evidence of extensive luminal irregularities and no calcification. No hemodynamically meaningful stenoses are seen. 3. The left circumflex coronary artery is a very large-size but non dominant vessel that generates two major oblque marginal arteries. There is evidence of extensive luminal irregularities and no calcification. No hemodynamically meaningful stenoses are seen. 4. The right coronary artery is a large-size dominant vessel that generates a branching posterior lateral ventricular system as well as the PDA. There is evidence of mild luminal irregularities and no calcification. No hemodynamically meaningful stenoses are seen.  5. The left ventricle is mildly dilated and exhibits some degree of sperical remodeling. The left ventricle systolic function is severely decreased with an estimated ejection fraction of 25%. Regional wall motion abnormalities are not seen. No left ventricular thrombus is seen. There is mild mitral insufficiency. The ascending aorta appears normal. There is no aortic valve stenosis by pullback. The left ventricular end-diastolic pressure is 13 mm Hg.    IMPRESSIONS:  Severe nonischemic dilated cardiomyopathy. Euvolemic/compensated CHF.  RECOMMENDATION:  Medical therapy for HF. Resume warfarin. Reevaluate EF after 3 months of maximum tolerated doses of ACEi and beta blockers.  Thurmon Fair, MD, [facility] [phone] office [phone] pager\ 05/12/2012 5:31 PM

## 2012-05-12 NOTE — Progress Notes (Signed)
[facility]  DAILY PROGRESS NOTE   Subjective:   No dyspnea. AF with increasing ventricular rate after diltiazem was stopped.HR 110-120 overnight, 90-100 this morning.  Objective:  Temp:  [97.4 F (36.3 C)-98.2 F (36.8 C)] 98.2 F (36.8 C) (11/15 0415) Pulse Rate:  [35-94] 35  (11/15 0420) Resp:  [20-22] 20  (11/14 2020) BP: (91-116)/(68-78) 103/77 mmHg (11/15 0405) SpO2:  [93 %-96 %] 96 % (11/15 0405) Weight:  [96.2 kg (212 lb 1.3 oz)] 96.2 kg (212 lb 1.3 oz) (11/15 0600) Weight change:   Intake/Output from previous day: 11/14 0701 - 11/15 0700 In: 990 [P.O.:720; I.V.:20; IV Piggyback:250] Out: 2725 [Urine:2725]  Intake/Output from this shift:    Medications: Current Facility-Administered Medications  Medication Dose Route Frequency Provider Last Rate Last Dose  . 0.9 %  sodium chloride infusion   Intravenous Continuous Milana Obey, MD      . acetaminophen (TYLENOL) tablet 500 mg  500 mg Oral Q4H PRN Milana Obey, MD      . albuterol (PROVENTIL HFA;VENTOLIN HFA) 108 (90 BASE) MCG/ACT inhaler 2 puff  2 puff Inhalation Q4H PRN Milana Obey, MD      . ALPRAZolam Prudy Feeler) tablet 0.25 mg  0.25 mg Oral TID PRN Abelino Derrick, PA      . azithromycin (ZITHROMAX) 500 mg in dextrose 5 % 250 mL IVPB  500 mg Intravenous Q24H Marietta Sikkema, MD   500 mg at 05/11/12 1245  . digoxin (LANOXIN) tablet 0.25 mg  0.25 mg Oral Daily Kajah Santizo, MD   0.25 mg at 05/11/12 0952  . doxazosin (CARDURA) tablet 8 mg  8 mg Oral Daily Milana Obey, MD   8 mg at 05/11/12 0953  . furosemide (LASIX) tablet 40 mg  40 mg Oral Daily Milana Obey, MD   40 mg at 05/11/12 0958  . gemfibrozil (LOPID) tablet 600 mg  600 mg Oral BID AC Milana Obey, MD   600 mg at 05/11/12 1743  . HYDROcodone-homatropine (HYCODAN) 5-1.5 MG/5ML syrup 5 mL  5 mL Oral Q4H PRN Fredirick Maudlin, MD   5 mL at 05/09/12 1339  . insulin aspart (novoLOG) injection 0-9 Units   0-9 Units Subcutaneous TID WC Milana Obey, MD   2 Units at 05/11/12 1743  . lisinopril (PRINIVIL,ZESTRIL) tablet 2.5 mg  2.5 mg Oral Daily Cru Kritikos, MD      . metoprolol tartrate (LOPRESSOR) tablet 25 mg  25 mg Oral BID Thurmon Fair, MD   25 mg at 05/11/12 2024  . oxybutynin (DITROPAN) tablet 5 mg  5 mg Oral TID PRN Milana Obey, MD      . [address] phytonadione (VITAMIN K) oral solution 1 mg/0.5 mL  1 mg Oral Once Thurmon Fair, MD   1 mg at 05/11/12 1843  . potassium chloride SA (K-DUR,KLOR-CON) CR tablet 20 mEq  20 mEq Oral BID Milana Obey, MD   20 mEq at 05/11/12 2024  . vitamin B-12 (CYANOCOBALAMIN) tablet 1,000 mcg  1,000 mcg Oral Daily Milana Obey, MD   1,000 mcg at 05/11/12 0953  . zolpidem (AMBIEN) tablet 5 mg  5 mg Oral QHS PRN Abelino Derrick, PA      . [DISCONTINUED] diltiazem (CARDIZEM) 100 mg in dextrose 5 % 100 mL infusion  5-15 mg/hr Intravenous Titrated Milana Obey, MD   10 mg/hr at 05/11/12 0037    Physical Exam:  General appearance: alert, cooperative  and no distress  Neck: no adenopathy, no carotid bruit, no JVD, supple, symmetrical, trachea midline and thyroid not enlarged, symmetric, no tenderness/mass/nodules  Lungs: clear to auscultation bilaterally  Heart: irregularly irregular rhythm and S1, S2 normal  Abdomen: soft, non-tender; bowel sounds normal; no masses, no organomegaly  Extremities: extremities normal, atraumatic, no cyanosis or edema and left anterior shin dusky red and minimally swollen in area of resolving hematoma/cellulitis  Pulses: 2+ and symmetric  Skin: Skin color, texture, turgor normal. No rashes or lesions or except as described above  Neurologic: Grossly normal Strabismus   Lab Results: Results for orders placed during the hospital encounter of 05/08/12 (from the past 48 hour(s))  GLUCOSE, CAPILLARY     Status: Abnormal   Collection Time   05/10/12 11:11 AM      Component Value Range Comment    Glucose-Capillary 225 (*) 70 - 99 mg/dL    Comment 1 Documented in Chart      Comment 2 Notify RN     MRSA PCR SCREENING     Status: Normal   Collection Time   05/10/12  6:07 PM      Component Value Range Comment   MRSA by PCR NEGATIVE  NEGATIVE   GLUCOSE, CAPILLARY     Status: Abnormal   Collection Time   05/10/12  6:52 PM      Component Value Range Comment   Glucose-Capillary 261 (*) 70 - 99 mg/dL   GLUCOSE, CAPILLARY     Status: Abnormal   Collection Time   05/10/12  8:15 PM      Component Value Range Comment   Glucose-Capillary 228 (*) 70 - 99 mg/dL   GLUCOSE, CAPILLARY     Status: Abnormal   Collection Time   05/10/12 10:25 PM      Component Value Range Comment   Glucose-Capillary 186 (*) 70 - 99 mg/dL   PROTIME-INR     Status: Abnormal   Collection Time   05/11/12  5:10 AM      Component Value Range Comment   Prothrombin Time 22.0 (*) 11.6 - 15.2 seconds    INR 2.01 (*) 0.00 - 1.49   BASIC METABOLIC PANEL     Status: Abnormal   Collection Time   05/11/12  5:10 AM      Component Value Range Comment   Sodium 137  135 - 145 mEq/L    Potassium 3.8  3.5 - 5.1 mEq/L    Chloride 102  96 - 112 mEq/L    CO2 27  19 - 32 mEq/L    Glucose, Bld 249 (*) 70 - 99 mg/dL    BUN 18  6 - 23 mg/dL    Creatinine, Ser 1.61  0.50 - 1.35 mg/dL    Calcium 9.0  8.4 - 09.6 mg/dL    GFR calc non Af Amer 81 (*) >90 mL/min    GFR calc Af Amer >90  >90 mL/min   GLUCOSE, CAPILLARY     Status: Abnormal   Collection Time   05/11/12  7:45 AM      Component Value Range Comment   Glucose-Capillary 198 (*) 70 - 99 mg/dL    Comment 1 Documented in Chart      Comment 2 Notify RN     GLUCOSE, CAPILLARY     Status: Abnormal   Collection Time   05/11/12 12:00 PM      Component Value Range Comment   Glucose-Capillary 210 (*) 70 - 99 mg/dL  Comment 1 Documented in Chart      Comment 2 Notify RN     GLUCOSE, CAPILLARY     Status: Abnormal   Collection Time   05/11/12  5:09 PM      Component  Value Range Comment   Glucose-Capillary 176 (*) 70 - 99 mg/dL    Comment 1 Documented in Chart      Comment 2 Notify RN     GLUCOSE, CAPILLARY     Status: Abnormal   Collection Time   05/11/12 10:03 PM      Component Value Range Comment   Glucose-Capillary 209 (*) 70 - 99 mg/dL   BASIC METABOLIC PANEL     Status: Abnormal   Collection Time   05/12/12  5:00 AM      Component Value Range Comment   Sodium 139  135 - 145 mEq/L    Potassium 3.5  3.5 - 5.1 mEq/L    Chloride 104  96 - 112 mEq/L    CO2 27  19 - 32 mEq/L    Glucose, Bld 157 (*) 70 - 99 mg/dL    BUN 17  6 - 23 mg/dL    Creatinine, Ser 9.60  0.50 - 1.35 mg/dL    Calcium 9.4  8.4 - 45.4 mg/dL    GFR calc non Af Amer 82 (*) >90 mL/min    GFR calc Af Amer >90  >90 mL/min   PROTIME-INR     Status: Abnormal   Collection Time   05/12/12  5:00 AM      Component Value Range Comment   Prothrombin Time 17.8 (*) 11.6 - 15.2 seconds    INR 1.51 (*) 0.00 - 1.49     Imaging: Dg Chest Port 1 View  05/10/2012  *RADIOLOGY REPORT*  Clinical Data: Cough, shortness of breath, hypertension  PORTABLE CHEST - 1 VIEW  Comparison: Chest x-ray of 05/05/2012 and CT chest of 05/08/2012  Findings: By recent CT of the chest, the opacity at the left lung base appears be due to a small left effusion and left basilar atelectasis.  Moderate cardiomegaly is stable.  No focal infiltrate or definite evidence of congestive heart failure is seen currently.  IMPRESSION: Stable small left pleural effusion.  Stable cardiomegaly.   Original Report Authenticated By: Dwyane Dee, M.D.     Assessment:  1. Active Problems: 2.  * No active hospital problems. *  3. Permanent AFib 4. Pulmonary edema, resolved 5. Systolic left heart failure, new onset  Plan:  1. Proceed with left heart cath and possible PCI, preferably via radial approach. He appears euvolemic. This procedure has been fully reviewed with the patient and written informed consent has been  obtained. 2. Increase beta blocker dose 3. Increase ACE inh after cath if BP allows 4.  Time Spent Directly with Patient:  35 minutes  Length of Stay:  LOS: 4 days    Tatisha Cerino 05/12/2012, 8:17 AM

## 2012-05-12 NOTE — Progress Notes (Signed)
TR BAND REMOVAL  LOCATION:    right radial  DEFLATED PER PROTOCOL:    yes  TIME BAND OFF / DRESSING APPLIED:    21:30   SITE UPON ARRIVAL:    Level 0  SITE AFTER BAND REMOVAL:    Level 0  REVERSE ALLEN'S TEST:     positive  CIRCULATION SENSATION AND MOVEMENT:    Within Normal Limits   yes  COMMENTS:    

## 2012-05-12 NOTE — Progress Notes (Signed)
ANTICOAGULATION CONSULT NOTE - Initial Consult  Pharmacy Consult for heparin and warfarin Indication: atrial fibrillation  Allergies  Allergen Reactions  . Niacin And Related     Unknown     Patient Measurements: Height: 6' (182.9 cm) Weight: 209 lb 10.5 oz (95.1 kg) IBW/kg (Calculated) : 77.6  Heparin Dosing Weight: 95 kg  Vital Signs: Temp: 97.5 F (36.4 C) (11/15 1200) Temp src: Oral (11/15 1200) BP: 114/88 mmHg (11/15 1200) Pulse Rate: 89  (11/15 1639)  Labs:  Basename 05/12/12 0500 05/11/12 0510 05/10/12 0504  HGB -- -- --  HCT -- -- --  PLT -- -- --  APTT -- -- --  LABPROT 17.8* 22.0* 23.7*  INR 1.51* 2.01* 2.23*  HEPARINUNFRC -- -- --  CREATININE 0.91 0.94 0.96  CKTOTAL -- -- --  CKMB -- -- --  TROPONINI -- -- --    Estimated Creatinine Clearance: 86.5 ml/min (by C-G formula based on Cr of 0.91).   Medical History: Past Medical History  Diagnosis Date  . Hypertension   . Diabetes mellitus   . IDA (iron deficiency anemia)   . Vitamin B 12 deficiency   . Hyperlipidemia   . HTN (hypertension)   . Epistaxis   . Atrial fibrillation     Sees Dr Tonia Ghent, pt/wife unsure but believe this is why he is on coumadin  . PFO (patent foramen ovale)   . Cellulitis of left lower leg     Medications:  Prescriptions prior to admission  Medication Sig Dispense Refill  . albuterol (PROAIR HFA) 108 (90 BASE) MCG/ACT inhaler Inhale 2 puffs into the lungs every 6 (six) hours as needed. For shortness of breath      . CINNAMON PO Take 1,000 mg by mouth daily.        Marland Kitchen diltiazem (DILACOR XR) 240 MG 24 hr capsule Take 240 mg by mouth daily.      Marland Kitchen doxazosin (CARDURA) 8 MG tablet Take 8 mg by mouth daily.      Marland Kitchen gemfibrozil (LOPID) 600 MG tablet Take 600 mg by mouth 2 (two) times daily before a meal.        . glipiZIDE (GLUCOTROL) 10 MG tablet Take 10 mg by mouth daily.       . metFORMIN (GLUCOPHAGE) 1000 MG tablet Take 1,000 mg by mouth 2 (two) times daily.       Marland Kitchen  oxybutynin (DITROPAN) 5 MG tablet Take 5 mg by mouth 3 (three) times daily as needed.      Marland Kitchen PRESCRIPTION MEDICATION Take 15 mLs by mouth every 6 (six) hours as needed. Cough. Apothecary Cough Syrup with Honey.      . vitamin B-12 (CYANOCOBALAMIN) 1000 MCG tablet Take 1,000 mcg by mouth daily.      Marland Kitchen warfarin (COUMADIN) 5 MG tablet Take 5 mg by mouth daily.         Assessment: 71 year old man on warfarin for afib to resume warfarin post cath.  Also, starting IV heparin 8 hours after sheath removed as a bridge to therapeutic warfarin.  His home warfarin dose is 5mg  daily Goal of Therapy:  INR 2-3 Heparin level 0.3-0.7 units/ml Monitor platelets by anticoagulation protocol: Yes   Plan:  Start heparin infusion at 1350 units/hr at 0115 on 11/16.  Heparin level 6 hours later. Warfarin 7.5mg  po x 1 Daily heparin level, cbc, and protime.  Adjust doses to goals.  Mickeal Skinner 05/12/2012,7:19 PM

## 2012-05-12 NOTE — Progress Notes (Signed)
Pt was transported to cath lab on RA and NS infusing.  Pt was alert and oriented prior to transfer.

## 2012-05-13 DIAGNOSIS — Z7901 Long term (current) use of anticoagulants: Secondary | ICD-10-CM

## 2012-05-13 DIAGNOSIS — IMO0001 Reserved for inherently not codable concepts without codable children: Secondary | ICD-10-CM

## 2012-05-13 DIAGNOSIS — I428 Other cardiomyopathies: Secondary | ICD-10-CM | POA: Diagnosis present

## 2012-05-13 LAB — GLUCOSE, CAPILLARY: Glucose-Capillary: 166 mg/dL — ABNORMAL HIGH (ref 70–99)

## 2012-05-13 LAB — PROTIME-INR
INR: 1.21 (ref 0.00–1.49)
Prothrombin Time: 15.1 seconds (ref 11.6–15.2)

## 2012-05-13 LAB — CBC
HCT: 41.1 % (ref 39.0–52.0)
Hemoglobin: 13.6 g/dL (ref 13.0–17.0)
MCH: 31 pg (ref 26.0–34.0)
MCHC: 33.1 g/dL (ref 30.0–36.0)
MCV: 93.6 fL (ref 78.0–100.0)
Platelets: 440 10*3/uL — ABNORMAL HIGH (ref 150–400)
RBC: 4.39 MIL/uL (ref 4.22–5.81)
RDW: 14.8 % (ref 11.5–15.5)
WBC: 8.1 10*3/uL (ref 4.0–10.5)

## 2012-05-13 LAB — HEPARIN LEVEL (UNFRACTIONATED): Heparin Unfractionated: <0.1 IU/mL — ABNORMAL LOW (ref 0.30–0.70)

## 2012-05-13 MED ORDER — WARFARIN SODIUM 10 MG PO TABS
10.0000 mg | ORAL_TABLET | Freq: Once | ORAL | Status: AC
  Administered 2012-05-13: 10 mg via ORAL
  Filled 2012-05-13: qty 1

## 2012-05-13 MED ORDER — HEPARIN BOLUS VIA INFUSION
2000.0000 [IU] | Freq: Once | INTRAVENOUS | Status: AC
  Administered 2012-05-13: 2000 [IU] via INTRAVENOUS
  Filled 2012-05-13: qty 2000

## 2012-05-13 NOTE — Progress Notes (Signed)
ANTICOAGULATION CONSULT NOTE - Follow Up Consult  Pharmacy Consult for Heparin Indication: atrial fibrillation  Allergies  Allergen Reactions  . Niacin And Related     Unknown     Patient Measurements: Height: 6' (182.9 cm) Weight: 210 lb 12.8 oz (95.618 kg) (scalew A) IBW/kg (Calculated) : 77.6  Heparin Dosing Weight: 95.6 kg  Vital Signs: Temp: 97.9 F (36.6 C) (11/16 1430) Temp src: Oral (11/16 1430) BP: 128/78 mmHg (11/16 1430) Pulse Rate: 66  (11/16 1430)  Labs:  Basename 05/13/12 1819 05/13/12 0849 05/13/12 0549 05/12/12 0500 05/11/12 0510  HGB -- 13.6 -- -- --  HCT -- 41.1 -- -- --  PLT -- 440* -- -- --  APTT -- -- -- -- --  LABPROT -- 15.1 -- 17.8* 22.0*  INR -- 1.21 -- 1.51* 2.01*  HEPARINUNFRC <0.10* -- <0.10* -- --  CREATININE -- -- -- 0.91 0.94  CKTOTAL -- -- -- -- --  CKMB -- -- -- -- --  TROPONINI -- -- -- -- --    Estimated Creatinine Clearance: 86.7 ml/min (by C-G formula based on Cr of 0.91).   Medications:  Scheduled:     . digoxin  0.25 mg Oral Daily  . doxazosin  8 mg Oral Daily  . furosemide  40 mg Oral Daily  . gemfibrozil  600 mg Oral BID AC  . insulin aspart  0-9 Units Subcutaneous TID WC  . lisinopril  2.5 mg Oral Daily  . potassium chloride  20 mEq Oral BID  . vitamin B-12  1,000 mcg Oral Daily  . [COMPLETED] warfarin  10 mg Oral ONCE-1800  . [COMPLETED] warfarin  7.5 mg Oral ONCE-1800  . Warfarin - Pharmacist Dosing Inpatient   Does not apply q1800   Infusions:     . [EXPIRED] sodium chloride 1 mL/kg/hr (05/12/12 2000)  . heparin 1,650 Units/hr (05/13/12 1100)    Assessment: 71 year old man on warfarin for afib to resume warfarin post cath. On IV heparin as a bridge to therapeutic warfarin. His home warfarin dose is 5mg  daily. Xa remains subtherapeutic, will bolus and increase dose.  Goal of Therapy:  Heparin level 0.3-0.7 units/ml Monitor platelets by anticoagulation protocol: Yes   Plan:  Heparin 2000 unit IV bolus  and increase gtt to 2000 units/hr. Check 8 hour heparin level. Coumadin 10mg  today.  Verlene Mayer, PharmD, BCPS Pager [phone] 05/13/2012 7:29 PM

## 2012-05-13 NOTE — Progress Notes (Signed)
ANTICOAGULATION CONSULT NOTE - Follow Up Consult  Pharmacy Consult for Heparin Indication: atrial fibrillation  Allergies  Allergen Reactions  . Niacin And Related     Unknown     Patient Measurements: Height: 6' (182.9 cm) Weight: 210 lb 12.8 oz (95.618 kg) (scalew A) IBW/kg (Calculated) : 77.6  Heparin Dosing Weight: 95.6 kg  Vital Signs: Temp: 97.8 F (36.6 C) (11/16 0946) Temp src: Oral (11/16 0946) BP: 118/69 mmHg (11/16 0946) Pulse Rate: 77  (11/16 0946)  Labs:  Basename 05/13/12 0849 05/13/12 0549 05/12/12 0500 05/11/12 0510  HGB 13.6 -- -- --  HCT 41.1 -- -- --  PLT 440* -- -- --  APTT -- -- -- --  LABPROT -- -- 17.8* 22.0*  INR -- -- 1.51* 2.01*  HEPARINUNFRC -- <0.10* -- --  CREATININE -- -- 0.91 0.94  CKTOTAL -- -- -- --  CKMB -- -- -- --  TROPONINI -- -- -- --    Estimated Creatinine Clearance: 86.7 ml/min (by C-G formula based on Cr of 0.91).   Medications:  Scheduled:    . [COMPLETED] aspirin  324 mg Oral Pre-Cath  . digoxin  0.25 mg Oral Daily  . doxazosin  8 mg Oral Daily  . [COMPLETED] fentaNYL      . furosemide  40 mg Oral Daily  . gemfibrozil  600 mg Oral BID AC  . [COMPLETED] heparin      . [COMPLETED] heparin      . insulin aspart  0-9 Units Subcutaneous TID WC  . [COMPLETED] lidocaine      . lisinopril  2.5 mg Oral Daily  . [COMPLETED] nitroGLYCERIN      . potassium chloride  20 mEq Oral BID  . [COMPLETED] verapamil      . vitamin B-12  1,000 mcg Oral Daily  . [COMPLETED] warfarin  7.5 mg Oral ONCE-1800  . Warfarin - Pharmacist Dosing Inpatient   Does not apply q1800  . [DISCONTINUED] metoprolol tartrate  50 mg Oral BID  . [DISCONTINUED] sodium chloride  3 mL Intravenous Q12H  . [DISCONTINUED] sodium chloride  3 mL Intravenous Q12H   Infusions:    . [EXPIRED] sodium chloride 1 mL/kg/hr (05/12/12 2000)  . heparin 1,350 Units/hr (05/13/12 0500)  . [DISCONTINUED] sodium chloride Stopped (05/11/12 0937)  . [DISCONTINUED] sodium  chloride    . [DISCONTINUED] sodium chloride    . [DISCONTINUED] heparin      Assessment: 71 year old man on warfarin for afib to resume warfarin post cath. On IV heparin as a bridge to therapeutic warfarin. His home warfarin dose is 5mg  daily.  HL subtherapeutic at <0.10. Will increase to 1650 units/hr. INR pending.  Goal of Therapy:  Heparin level 0.3-0.7 units/ml Monitor platelets by anticoagulation protocol: Yes   Plan:  - Increase heparin infusion to 1650 units/hr - Check heparin level in 8 hours  Lillia Pauls, PharmD Clinical Pharmacist Pager: [phone] Phone: [phone] 05/13/2012 10:10 AM

## 2012-05-13 NOTE — Progress Notes (Signed)
Subjective: No CP/SOB  Objective:  Temp:  [97.5 F (36.4 C)-98 F (36.7 C)] 97.7 F (36.5 C) (11/16 0731) Pulse Rate:  [62-94] 84  (11/16 0731) Resp:  [16-24] 24  (11/16 0731) BP: (98-138)/(70-98) 113/70 mmHg (11/16 0731) SpO2:  [94 %-98 %] 98 % (11/16 0731) Weight:  [95.1 kg (209 lb 10.5 oz)-97.4 kg (214 lb 11.7 oz)] 97.4 kg (214 lb 11.7 oz) (11/16 0430) Weight change: -4.3 kg (-9 lb 7.7 oz)  Intake/Output from previous day: 11/15 0701 - 11/16 0700 In: 703.5 [P.O.:240; I.V.:463.5] Out: 2125 [Urine:2125]  Intake/Output from this shift:    Physical Exam: General appearance: alert, cooperative and no distress Neck: no adenopathy, no carotid bruit, no JVD, supple, symmetrical, trachea midline and thyroid not enlarged, symmetric, no tenderness/mass/nodules Lungs: clear to auscultation bilaterally Heart: irregularly irregular rhythm Extremities: right radial puncture site OK  Lab Results: Results for orders placed during the hospital encounter of 05/08/12 (from the past 48 hour(s))  GLUCOSE, CAPILLARY     Status: Abnormal   Collection Time   05/11/12 12:00 PM      Component Value Range Comment   Glucose-Capillary 210 (*) 70 - 99 mg/dL    Comment 1 Documented in Chart      Comment 2 Notify RN     GLUCOSE, CAPILLARY     Status: Abnormal   Collection Time   05/11/12  5:09 PM      Component Value Range Comment   Glucose-Capillary 176 (*) 70 - 99 mg/dL    Comment 1 Documented in Chart      Comment 2 Notify RN     GLUCOSE, CAPILLARY     Status: Abnormal   Collection Time   05/11/12 10:03 PM      Component Value Range Comment   Glucose-Capillary 209 (*) 70 - 99 mg/dL   BASIC METABOLIC PANEL     Status: Abnormal   Collection Time   05/12/12  5:00 AM      Component Value Range Comment   Sodium 139  135 - 145 mEq/L    Potassium 3.5  3.5 - 5.1 mEq/L    Chloride 104  96 - 112 mEq/L    CO2 27  19 - 32 mEq/L    Glucose, Bld 157 (*) 70 - 99 mg/dL    BUN 17  6 - 23 mg/dL    Creatinine, Ser 9.60  0.50 - 1.35 mg/dL    Calcium 9.4  8.4 - 45.4 mg/dL    GFR calc non Af Amer 82 (*) >90 mL/min    GFR calc Af Amer >90  >90 mL/min   PROTIME-INR     Status: Abnormal   Collection Time   05/12/12  5:00 AM      Component Value Range Comment   Prothrombin Time 17.8 (*) 11.6 - 15.2 seconds    INR 1.51 (*) 0.00 - 1.49   GLUCOSE, CAPILLARY     Status: Abnormal   Collection Time   05/12/12  8:16 AM      Component Value Range Comment   Glucose-Capillary 133 (*) 70 - 99 mg/dL    Comment 1 Notify RN      Comment 2 Documented in Chart     GLUCOSE, CAPILLARY     Status: Abnormal   Collection Time   05/12/12 12:49 PM      Component Value Range Comment   Glucose-Capillary 146 (*) 70 - 99 mg/dL    Comment 1 Notify RN  GLUCOSE, CAPILLARY     Status: Abnormal   Collection Time   05/12/12  2:55 PM      Component Value Range Comment   Glucose-Capillary 139 (*) 70 - 99 mg/dL   GLUCOSE, CAPILLARY     Status: Abnormal   Collection Time   05/12/12  9:20 PM      Component Value Range Comment   Glucose-Capillary 174 (*) 70 - 99 mg/dL    Comment 1 Documented in Chart      Comment 2 Notify RN       Imaging: Imaging results have been reviewed  Assessment/Plan:   1. Active Problems: 2.  * No active hospital problems. *  3.   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 On IV hep/coumadin. Exam benign. Afib with CVR. Exam benign. Tx tele. Plan optimal medical therapy for 3 months then reassess LV function. Will need LifeVest prior to D/C. Home when INR therapeutic  Runell Gess, M.D., [facility] [address] [place], [place]  [address]  [phone] 05/13/2012 8:03 AM .

## 2012-05-14 LAB — CBC
HCT: 40.8 % (ref 39.0–52.0)
Hemoglobin: 13.3 g/dL (ref 13.0–17.0)
MCH: 30.2 pg (ref 26.0–34.0)
MCHC: 32.6 g/dL (ref 30.0–36.0)
MCV: 92.5 fL (ref 78.0–100.0)
Platelets: 482 10*3/uL — ABNORMAL HIGH (ref 150–400)
RBC: 4.41 MIL/uL (ref 4.22–5.81)
RDW: 14.8 % (ref 11.5–15.5)
WBC: 7.9 10*3/uL (ref 4.0–10.5)

## 2012-05-14 LAB — GLUCOSE, CAPILLARY
Glucose-Capillary: 187 mg/dL — ABNORMAL HIGH (ref 70–99)
Glucose-Capillary: 203 mg/dL — ABNORMAL HIGH (ref 70–99)
Glucose-Capillary: 155 mg/dL — ABNORMAL HIGH (ref 70–99)
Glucose-Capillary: 134 mg/dL — ABNORMAL HIGH (ref 70–99)

## 2012-05-14 LAB — PROTIME-INR
INR: 1.42 (ref 0.00–1.49)
Prothrombin Time: 17 seconds — ABNORMAL HIGH (ref 11.6–15.2)

## 2012-05-14 MED ORDER — POLYVINYL ALCOHOL 1.4 % OP SOLN
1.0000 [drp] | OPHTHALMIC | Status: DC | PRN
  Filled 2012-05-14: qty 15

## 2012-05-14 MED ORDER — WARFARIN SODIUM 10 MG PO TABS
10.0000 mg | ORAL_TABLET | Freq: Once | ORAL | Status: AC
  Administered 2012-05-14: 10 mg via ORAL
  Filled 2012-05-14: qty 1

## 2012-05-14 NOTE — Progress Notes (Addendum)
ANTICOAGULATION CONSULT NOTE - Follow Up Consult  Pharmacy Consult for Heparin/Warfarin Indication: atrial fibrillation  Allergies  Allergen Reactions  . Niacin And Related     Unknown     Patient Measurements: Height: 6' (182.9 cm) Weight: 205 lb 12.8 oz (93.35 kg) (scale a) IBW/kg (Calculated) : 77.6  Heparin Dosing Weight: 95.6 kg  Vital Signs: Temp: 97.8 F (36.6 C) (11/17 0443) Temp src: Oral (11/17 0443) BP: 157/78 mmHg (11/17 0443) Pulse Rate: 77  (11/17 0443)  Labs:  Basename 05/14/12 0640 05/13/12 1819 05/13/12 0849 05/13/12 0549 05/12/12 0500  HGB 13.3 -- 13.6 -- --  HCT 40.8 -- 41.1 -- --  PLT 482* -- 440* -- --  APTT -- -- -- -- --  LABPROT 17.0* -- 15.1 -- 17.8*  INR 1.42 -- 1.21 -- 1.51*  HEPARINUNFRC 0.44 <0.10* -- <0.10* --  CREATININE -- -- -- -- 0.91  CKTOTAL -- -- -- -- --  CKMB -- -- -- -- --  TROPONINI -- -- -- -- --   Estimated Creatinine Clearance: 85.8 ml/min (by C-G formula based on Cr of 0.91).  Medications:  Scheduled:     . digoxin  0.25 mg Oral Daily  . doxazosin  8 mg Oral Daily  . furosemide  40 mg Oral Daily  . gemfibrozil  600 mg Oral BID AC  . [COMPLETED] heparin  2,000 Units Intravenous Once  . insulin aspart  0-9 Units Subcutaneous TID WC  . lisinopril  2.5 mg Oral Daily  . potassium chloride  20 mEq Oral BID  . vitamin B-12  1,000 mcg Oral Daily  . [COMPLETED] warfarin  10 mg Oral ONCE-1800  . Warfarin - Pharmacist Dosing Inpatient   Does not apply q1800   Assessment: 71 year old man presented with c/o shortness of breath.  He has been on warfarin for afib.  This was resumed post cath which revealed severe nonischemic dilated cardiomyopathy.  The plan is to provide IV heparin as a bridge until he is therapeutic with warfarin.   His heparin level today is 0.44IU/ml and within the target range of 0.3-0.7.  His INR is trending up and is 1.42.  He is without noted bleeding complications with his anticoagulation.  HOME dose:   Warfarin dose is 5mg  daily.   Drug/Drug Interactions:  There is some drug interactions between Gemfibrozil and Warfarin.   Mechanism: Unknown. gemfibrozil may inhibit cytochrome P450 2C9-mediated metabolism of warfarin This has been a home dose for him and I suspect his dose of Warfarin already reflects the adjustment.  Goal of Therapy:  Heparin level 0.3-0.7 units/ml Monitor platelets by anticoagulation protocol: Yes   Plan:   - Continue IV Heparin at 2000 units/hr.   - Check AM heparin level.   - Give Coumadin 10mg  today.  - F/U AM PT/INR  Nadara Mustard, PharmD., MS Clinical Pharmacist Pager:  [phone] Thank you for allowing pharmacy to be part of this patients care team. 05/14/2012 12:18 PM

## 2012-05-14 NOTE — Progress Notes (Signed)
I spoke with Morrie Sheldon about life vest yesterday.  Corine Shelter PA-C 05/14/2012 9:06 AM

## 2012-05-14 NOTE — Progress Notes (Signed)
Subjective:  No CP/SOB  Objective:  Temp:  [97.5 F (36.4 C)-97.9 F (36.6 C)] 97.8 F (36.6 C) (11/17 0443) Pulse Rate:  [66-97] 77  (11/17 0443) Resp:  [18-20] 18  (11/17 0443) BP: (118-157)/(69-86) 157/78 mmHg (11/17 0443) SpO2:  [93 %-95 %] 93 % (11/17 0443) Weight:  [93.35 kg (205 lb 12.8 oz)-95.618 kg (210 lb 12.8 oz)] 93.35 kg (205 lb 12.8 oz) (11/17 0443) Weight change: 0.518 kg (1 lb 2.3 oz)  Intake/Output from previous day: 11/16 0701 - 11/17 0700 In: 1081.5 [P.O.:960; I.V.:121.5] Out: 4300 [Urine:4300]  Intake/Output from this shift:    Physical Exam: General appearance: alert, cooperative and no distress Neck: no adenopathy, no carotid bruit, no JVD, supple, symmetrical, trachea midline and thyroid not enlarged, symmetric, no tenderness/mass/nodules Lungs: clear to auscultation bilaterally Heart: irregularly irregular rhythm Extremities: extremities normal, atraumatic, no cyanosis or edema  Lab Results: Results for orders placed during the hospital encounter of 05/08/12 (from the past 48 hour(s))  GLUCOSE, CAPILLARY     Status: Abnormal   Collection Time   05/12/12  8:16 AM      Component Value Range Comment   Glucose-Capillary 133 (*) 70 - 99 mg/dL    Comment 1 Notify RN      Comment 2 Documented in Chart     GLUCOSE, CAPILLARY     Status: Abnormal   Collection Time   05/12/12 12:49 PM      Component Value Range Comment   Glucose-Capillary 146 (*) 70 - 99 mg/dL    Comment 1 Notify RN     GLUCOSE, CAPILLARY     Status: Abnormal   Collection Time   05/12/12  2:55 PM      Component Value Range Comment   Glucose-Capillary 139 (*) 70 - 99 mg/dL   GLUCOSE, CAPILLARY     Status: Abnormal   Collection Time   05/12/12  9:20 PM      Component Value Range Comment   Glucose-Capillary 174 (*) 70 - 99 mg/dL    Comment 1 Documented in Chart      Comment 2 Notify RN     HEPARIN LEVEL (UNFRACTIONATED)     Status: Abnormal   Collection Time   05/13/12  5:49 AM        Component Value Range Comment   Heparin Unfractionated <0.10 (*) 0.30 - 0.70 IU/mL   GLUCOSE, CAPILLARY     Status: Abnormal   Collection Time   05/13/12  7:35 AM      Component Value Range Comment   Glucose-Capillary 146 (*) 70 - 99 mg/dL   PROTIME-INR     Status: Normal   Collection Time   05/13/12  8:49 AM      Component Value Range Comment   Prothrombin Time 15.1  11.6 - 15.2 seconds    INR 1.21  0.00 - 1.49   CBC     Status: Abnormal   Collection Time   05/13/12  8:49 AM      Component Value Range Comment   WBC 8.1  4.0 - 10.5 K/uL    RBC 4.39  4.22 - 5.81 MIL/uL    Hemoglobin 13.6  13.0 - 17.0 g/dL    HCT 16.1  09.6 - 04.5 %    MCV 93.6  78.0 - 100.0 fL    MCH 31.0  26.0 - 34.0 pg    MCHC 33.1  30.0 - 36.0 g/dL    RDW 40.9  81.1 - 91.4 %  Platelets 440 (*) 150 - 400 K/uL   GLUCOSE, CAPILLARY     Status: Abnormal   Collection Time   05/13/12 11:15 AM      Component Value Range Comment   Glucose-Capillary 166 (*) 70 - 99 mg/dL    Comment 1 Documented in Chart      Comment 2 Notify RN     GLUCOSE, CAPILLARY     Status: Abnormal   Collection Time   05/13/12  4:14 PM      Component Value Range Comment   Glucose-Capillary 207 (*) 70 - 99 mg/dL    Comment 1 Documented in Chart      Comment 2 Notify RN     HEPARIN LEVEL (UNFRACTIONATED)     Status: Abnormal   Collection Time   05/13/12  6:19 PM      Component Value Range Comment   Heparin Unfractionated <0.10 (*) 0.30 - 0.70 IU/mL   GLUCOSE, CAPILLARY     Status: Abnormal   Collection Time   05/13/12  9:02 PM      Component Value Range Comment   Glucose-Capillary 169 (*) 70 - 99 mg/dL   GLUCOSE, CAPILLARY     Status: Abnormal   Collection Time   05/14/12  5:51 AM      Component Value Range Comment   Glucose-Capillary 187 (*) 70 - 99 mg/dL    Comment 1 Notify RN      Comment 2 Documented in Chart     PROTIME-INR     Status: Abnormal   Collection Time   05/14/12  6:40 AM      Component Value Range  Comment   Prothrombin Time 17.0 (*) 11.6 - 15.2 seconds    INR 1.42  0.00 - 1.49   CBC     Status: Abnormal   Collection Time   05/14/12  6:40 AM      Component Value Range Comment   WBC 7.9  4.0 - 10.5 K/uL    RBC 4.41  4.22 - 5.81 MIL/uL    Hemoglobin 13.3  13.0 - 17.0 g/dL    HCT 40.9  81.1 - 91.4 %    MCV 92.5  78.0 - 100.0 fL    MCH 30.2  26.0 - 34.0 pg    MCHC 32.6  30.0 - 36.0 g/dL    RDW 78.2  95.6 - 21.3 %    Platelets 482 (*) 150 - 400 K/uL   HEPARIN LEVEL (UNFRACTIONATED)     Status: Normal   Collection Time   05/14/12  6:40 AM      Component Value Range Comment   Heparin Unfractionated 0.44  0.30 - 0.70 IU/mL     Imaging: Imaging results have been reviewed  Assessment/Plan:   1. Principal Problem: 2.  *NICM (nonischemic cardiomyopathy) 3. Active Problems: 4.  Diabetes mellitus 5.  Permanent atrial fibrillation 6.  HTN (hypertension) 7.  Chronic anticoagulation 8.  CAD (coronary artery disease), minor by cath 05/12/12 9.   Time Spent Directly with Patient:  20 minutes  Length of Stay:  LOS: 6 days   NISCM, AFIB with CVR on iv hep to coumadin A/C. No complaints. Exam benign. Pharm managing anticoag. Home when INR >2, will need LifeVest.  BERRY,JONATHAN J 05/14/2012, 7:46 AM

## 2012-05-15 LAB — PROTIME-INR
INR: 1.72 — ABNORMAL HIGH (ref 0.00–1.49)
Prothrombin Time: 19.6 seconds — ABNORMAL HIGH (ref 11.6–15.2)

## 2012-05-15 LAB — CBC
HCT: 43.3 % (ref 39.0–52.0)
Hemoglobin: 14.5 g/dL (ref 13.0–17.0)
MCH: 31.3 pg (ref 26.0–34.0)
MCHC: 33.5 g/dL (ref 30.0–36.0)
MCV: 93.3 fL (ref 78.0–100.0)
Platelets: 501 10*3/uL — ABNORMAL HIGH (ref 150–400)
RBC: 4.64 MIL/uL (ref 4.22–5.81)
RDW: 14.9 % (ref 11.5–15.5)
WBC: 7.6 10*3/uL (ref 4.0–10.5)

## 2012-05-15 LAB — GLUCOSE, CAPILLARY
Glucose-Capillary: 125 mg/dL — ABNORMAL HIGH (ref 70–99)
Glucose-Capillary: 230 mg/dL — ABNORMAL HIGH (ref 70–99)
Glucose-Capillary: 138 mg/dL — ABNORMAL HIGH (ref 70–99)

## 2012-05-15 LAB — HEPARIN LEVEL (UNFRACTIONATED): Heparin Unfractionated: 0.72 IU/mL — ABNORMAL HIGH (ref 0.30–0.70)

## 2012-05-15 MED ORDER — WARFARIN SODIUM 7.5 MG PO TABS
7.5000 mg | ORAL_TABLET | Freq: Once | ORAL | Status: AC
  Administered 2012-05-15: 7.5 mg via ORAL
  Filled 2012-05-15: qty 1

## 2012-05-15 MED ORDER — HEPARIN BOLUS VIA INFUSION
2000.0000 [IU] | Freq: Once | INTRAVENOUS | Status: AC
  Administered 2012-05-15: 2000 [IU] via INTRAVENOUS
  Filled 2012-05-15: qty 2000

## 2012-05-15 MED ORDER — METOPROLOL TARTRATE 50 MG PO TABS
50.0000 mg | ORAL_TABLET | Freq: Two times a day (BID) | ORAL | Status: DC
  Administered 2012-05-15 – 2012-05-16 (×2): 50 mg via ORAL
  Filled 2012-05-15 (×3): qty 1

## 2012-05-15 MED ORDER — METOPROLOL TARTRATE 12.5 MG HALF TABLET
12.5000 mg | ORAL_TABLET | Freq: Two times a day (BID) | ORAL | Status: DC
  Administered 2012-05-15: 12.5 mg via ORAL
  Filled 2012-05-15 (×2): qty 1

## 2012-05-15 NOTE — Progress Notes (Signed)
[facility]  Subjective: No SOB, CP or dizziness with ambulation.  Objective: Vital signs in last 24 hours: Temp:  [97.7 F (36.5 C)-98 F (36.7 C)] 98 F (36.7 C) (11/18 0447) Pulse Rate:  [70-150] 150  (11/18 0857) Resp:  [16-20] 17  (11/18 0447) BP: (132-147)/(67-91) 132/70 mmHg (11/18 0900) SpO2:  [95 %-98 %] 96 % (11/18 0447) Weight:  [93.5 kg (206 lb 2.1 oz)] 93.5 kg (206 lb 2.1 oz) (11/18 0447) Last BM Date: 05/13/12  Intake/Output from previous day: 11/17 0701 - 11/18 0700 In: 960 [P.O.:720; I.V.:240] Out: 4500 [Urine:4500] Intake/Output this shift: Total I/O In: 240 [P.O.:240] Out: -   Medications Current Facility-Administered Medications  Medication Dose Route Frequency Provider Last Rate Last Dose  . albuterol (PROVENTIL HFA;VENTOLIN HFA) 108 (90 BASE) MCG/ACT inhaler 2 puff  2 puff Inhalation Q4H PRN Milana Obey, MD      . ALPRAZolam Prudy Feeler) tablet 0.25 mg  0.25 mg Oral TID PRN Abelino Derrick, PA      . digoxin (LANOXIN) tablet 0.25 mg  0.25 mg Oral Daily Deiontae Rabel, MD   0.25 mg at 05/15/12 0857  . doxazosin (CARDURA) tablet 8 mg  8 mg Oral Daily Milana Obey, MD   8 mg at 05/15/12 0900  . furosemide (LASIX) tablet 40 mg  40 mg Oral Daily Milana Obey, MD   40 mg at 05/15/12 1027  . gemfibrozil (LOPID) tablet 600 mg  600 mg Oral BID AC Milana Obey, MD   600 mg at 05/15/12 0857  . heparin ADULT infusion 100 units/mL (25000 units/250 mL)  2,300 Units/hr Intravenous Continuous Daphna Lafuente, MD 23 mL/hr at 05/15/12 0814 2,300 Units/hr at 05/15/12 0814  . [COMPLETED] heparin bolus via infusion 2,000 Units  2,000 Units Intravenous Once Thurmon Fair, MD   2,000 Units at 05/15/12 0741  . HYDROcodone-homatropine (HYCODAN) 5-1.5 MG/5ML syrup 5 mL  5 mL Oral Q4H PRN Fredirick Maudlin, MD   5 mL at 05/09/12 1339  . insulin aspart (novoLOG) injection 0-9 Units  0-9 Units Subcutaneous TID WC Milana Obey, MD    1 Units at 05/15/12 [phone]  . lisinopril (PRINIVIL,ZESTRIL) tablet 2.5 mg  2.5 mg Oral Daily Daneil Beem, MD   2.5 mg at 05/15/12 0900  . ondansetron (ZOFRAN) injection 4 mg  4 mg Intravenous Q6H PRN Derenda Giddings, MD      . oxybutynin (DITROPAN) tablet 5 mg  5 mg Oral TID PRN Milana Obey, MD      . oxyCODONE-acetaminophen (PERCOCET/ROXICET) 5-325 MG per tablet 1-2 tablet  1-2 tablet Oral Q4H PRN Thurmon Fair, MD   1 tablet at 05/13/12 0807  . polyvinyl alcohol (LIQUIFILM TEARS) 1.4 % ophthalmic solution 1 drop  1 drop Both Eyes PRN Amy L. Janee Morn, PA      . potassium chloride SA (K-DUR,KLOR-CON) CR tablet 20 mEq  20 mEq Oral BID Milana Obey, MD   20 mEq at 05/15/12 1027  . vitamin B-12 (CYANOCOBALAMIN) tablet 1,000 mcg  1,000 mcg Oral Daily Milana Obey, MD   1,000 mcg at 05/15/12 1027  . [COMPLETED] warfarin (COUMADIN) tablet 10 mg  10 mg Oral ONCE-1800 Chinita Greenland, PHARMD   10 mg at 05/14/12 1703  . warfarin (COUMADIN) tablet 7.5 mg  7.5 mg Oral ONCE-1800 Thurmon Fair, MD      . Warfarin - Pharmacist Dosing Inpatient   Does not apply [id number] Thurmon Fair, MD      .  zolpidem (AMBIEN) tablet 5 mg  5 mg Oral QHS PRN Abelino Derrick, PA        PE: General appearance: alert, cooperative and no distress Lungs: clear to auscultation bilaterally Heart: irregularly irregular rhythm and No MM Extremities: No LEE Pulses: Radials 2+ and symmetric,1+ DPs. Skin: Warm and Dry Neurologic: Grossly normal  Lab Results:   Basename 05/15/12 0610 05/14/12 0640 05/13/12 0849  WBC 7.6 7.9 8.1  HGB 14.5 13.3 13.6  HCT 43.3 40.8 41.1  PLT 501* 482* 440*   BMET No results found for this basename: NA:3,K:3,CL:3,CO2:3,GLUCOSE:3,BUN:3,CREATININE:3,CALCIUM:3 in the last 72 hours PT/INR  Basename 05/15/12 0610 05/14/12 0640 05/13/12 0849  LABPROT 19.6* 17.0* 15.1  INR 1.72* 1.42 1.21    Assessment/Plan   Principal Problem:  *NICM (nonischemic cardiomyopathy) Active  Problems:  Diabetes mellitus  Permanent atrial fibrillation  HTN (hypertension)  Chronic anticoagulation  CAD (coronary artery disease), minor by cath 05/12/12  Plan: Afib with RVR: HR is elevated to the 140's and 150's.  May be related to ambulation and cleaning up this AM.  Currently 120's and he is sitting in the chair.  Will add lopressor 12.5 bid.   Hep to coumadin bridge.  INR 1.72.  Lifevest ordered and will be fitted today. Likely DC home tomorrow.     LOS: 7 days    HAGER, BRYAN 05/15/2012 11:53 AM  I have seen and examined the patient along with HAGER, BRYAN, PA.  I have reviewed the chart, notes and new data.  I agree with PA's note.  He is very tachycardic now, because he has not received any beta blocker over the weekend. His metoprolol was inadvertently stopped Friday evening as part of his transfer to a new unit and I am trying to find out how this happened. He is being fitted for Life Vest this evening.  Restarting metoprolol 50 mg BID today. We may have to keep him an extra day to make sure he tolerates this dose of metoprolol. His LifeVest VT zone should be increased to 180 bpm to avoid unnecessary shocks for AF with RVR. I do not think it is critical that he be fully anticoagulated by the time of discharge - he has never had a thromboembolic AF related event. He received 1 mg of Vitamin K Friday and it may take a few more days until the INR is therapeutic.  Thurmon Fair, MD, [facility] [phone] 05/15/2012, 1:10 PM

## 2012-05-15 NOTE — Progress Notes (Signed)
Spoke with patient and wife at bedside. Explained [facility] Care Management services in detail and consents signed at bedside. Spoke with inpatient RNCM to make aware. Patient likely to discharge home tomorrow per wife. To have LifeVest at home as well. Will likely benefit from [facility] RN as well upon discharge. Wife and patient appreciative of visit.  Raiford Noble, RN,BSN, MSN [facility] Liaison [phone]

## 2012-05-15 NOTE — Progress Notes (Signed)
ANTICOAGULATION CONSULT NOTE - Follow Up Consult  Pharmacy Consult for heparin Indication: atrial fibrillation  Allergies  Allergen Reactions  . Niacin And Related     Unknown     Patient Measurements: Height: 6' (182.9 cm) Weight: 206 lb 2.1 oz (93.5 kg) IBW/kg (Calculated) : 77.6  Heparin Dosing Weight: 95.6 kg  Vital Signs: Temp: 98.1 F (36.7 C) (11/18 1556) Temp src: Oral (11/18 1556) BP: 109/74 mmHg (11/18 1556) Pulse Rate: 84  (11/18 1556)  Labs:  Basename 05/15/12 1528 05/15/12 0610 05/14/12 0640 05/13/12 0849  HGB -- 14.5 13.3 --  HCT -- 43.3 40.8 41.1  PLT -- 501* 482* 440*  APTT -- -- -- --  LABPROT -- 19.6* 17.0* 15.1  INR -- 1.72* 1.42 1.21  HEPARINUNFRC 0.72* 0.20* 0.44 --  CREATININE -- -- -- --  CKTOTAL -- -- -- --  CKMB -- -- -- --  TROPONINI -- -- -- --    Estimated Creatinine Clearance: 85.9 ml/min (by C-G formula based on Cr of 0.91).   Medications:  Scheduled:    . digoxin  0.25 mg Oral Daily  . doxazosin  8 mg Oral Daily  . furosemide  40 mg Oral Daily  . gemfibrozil  600 mg Oral BID AC  . [COMPLETED] heparin  2,000 Units Intravenous Once  . insulin aspart  0-9 Units Subcutaneous TID WC  . lisinopril  2.5 mg Oral Daily  . metoprolol tartrate  50 mg Oral BID  . potassium chloride  20 mEq Oral BID  . vitamin B-12  1,000 mcg Oral Daily  . [COMPLETED] warfarin  10 mg Oral ONCE-1800  . warfarin  7.5 mg Oral ONCE-1800  . Warfarin - Pharmacist Dosing Inpatient   Does not apply q1800  . [DISCONTINUED] metoprolol tartrate  12.5 mg Oral BID   Infusions:    . heparin 2,300 Units/hr (05/15/12 1610)    Assessment: 71 yo male with afib is currently on supratherapeutic heparin.  Heparin level was 0.72.   Goal of Therapy:  Heparin level 0.3-0.7 units/ml Monitor platelets by anticoagulation protocol: Yes   Plan:  1) Reduce heparin to 2200 units/hr.  2) 8 hr heparin level after rate is changed.  Geet Hosking, Tsz-Yin 05/15/2012,4:20 PM

## 2012-05-15 NOTE — Progress Notes (Signed)
ANTICOAGULATION CONSULT NOTE - Follow Up Consult  Pharmacy Consult for Heparin/Warfarin Indication: atrial fibrillation  Allergies  Allergen Reactions  . Niacin And Related     Unknown     Patient Measurements: Height: 6' (182.9 cm) Weight: 206 lb 2.1 oz (93.5 kg) IBW/kg (Calculated) : 77.6  Heparin Dosing Weight: 95.6 kg  Vital Signs: Temp: 98 F (36.7 C) (11/18 0447) Temp src: Oral (11/18 0447) BP: 139/75 mmHg (11/18 0447) Pulse Rate: 90  (11/18 0447)  Labs:  Basename 05/15/12 0610 05/14/12 0640 05/13/12 1819 05/13/12 0849  HGB 14.5 13.3 -- --  HCT 43.3 40.8 -- 41.1  PLT 501* 482* -- 440*  APTT -- -- -- --  LABPROT 19.6* 17.0* -- 15.1  INR 1.72* 1.42 -- 1.21  HEPARINUNFRC 0.20* 0.44 <0.10* --  CREATININE -- -- -- --  CKTOTAL -- -- -- --  CKMB -- -- -- --  TROPONINI -- -- -- --   Estimated Creatinine Clearance: 85.9 ml/min (by C-G formula based on Cr of 0.91).  Medications:  Scheduled:     . digoxin  0.25 mg Oral Daily  . doxazosin  8 mg Oral Daily  . furosemide  40 mg Oral Daily  . gemfibrozil  600 mg Oral BID AC  . insulin aspart  0-9 Units Subcutaneous TID WC  . lisinopril  2.5 mg Oral Daily  . potassium chloride  20 mEq Oral BID  . vitamin B-12  1,000 mcg Oral Daily  . [COMPLETED] warfarin  10 mg Oral ONCE-1800  . Warfarin - Pharmacist Dosing Inpatient   Does not apply q1800   Assessment: 71 year old man presented with c/o shortness of breath.  He has been on warfarin for afib.  This was resumed post cath which revealed severe nonischemic dilated cardiomyopathy.  The plan is to provide IV heparin as a bridge until he is therapeutic with warfarin.  Heparin level (0.2) is below-goal on 2000 units/hr. No problem with line / infusion per RN. INR 1.72 is trending up.   HOME dose:  Warfarin dose is 5mg  daily.   Drug/Drug Interactions:  There is some drug interactions between Gemfibrozil and Warfarin.   Mechanism: Unknown. gemfibrozil may inhibit cytochrome  P450 2C9-mediated metabolism of warfarin This has been a home dose for him and I suspect his dose of Warfarin already reflects the adjustment.  Goal of Therapy:  Heparin level 0.3-0.7 units/ml INR 2-3 Monitor platelets by anticoagulation protocol: Yes   Plan:  1. Heparin IV bolus of 2000 units x 1, then increase IV infusion to 2300 units/hr.  2. Heparin level in 8 hours.  3. Coumadin 7.5mg  po today. 4. Daily PT / INR  Lorre Munroe, PharmD 05/15/2012 7:18 AM

## 2012-05-16 DIAGNOSIS — I5023 Acute on chronic systolic (congestive) heart failure: Secondary | ICD-10-CM | POA: Diagnosis present

## 2012-05-16 LAB — CBC
HCT: 42.1 % (ref 39.0–52.0)
Hemoglobin: 13.9 g/dL (ref 13.0–17.0)
MCV: 91.9 fL (ref 78.0–100.0)
WBC: 7.7 10*3/uL (ref 4.0–10.5)

## 2012-05-16 LAB — GLUCOSE, CAPILLARY
Glucose-Capillary: 203 mg/dL — ABNORMAL HIGH (ref 70–99)
Glucose-Capillary: 138 mg/dL — ABNORMAL HIGH (ref 70–99)

## 2012-05-16 LAB — PROTIME-INR: INR: 1.83 — ABNORMAL HIGH (ref 0.00–1.49)

## 2012-05-16 MED ORDER — DIGOXIN 250 MCG PO TABS: 0.2500 mg | ORAL_TABLET | Freq: Every day | ORAL | Status: DC

## 2012-05-16 MED ORDER — FUROSEMIDE 40 MG PO TABS: 40.0000 mg | ORAL_TABLET | Freq: Every day | ORAL | Status: DC

## 2012-05-16 MED ORDER — METOPROLOL TARTRATE 50 MG PO TABS: 50.0000 mg | ORAL_TABLET | Freq: Two times a day (BID) | ORAL | Status: DC

## 2012-05-16 MED ORDER — LISINOPRIL 2.5 MG PO TABS: 2.5000 mg | ORAL_TABLET | Freq: Every day | ORAL | Status: DC

## 2012-05-16 MED ORDER — POTASSIUM CHLORIDE CRYS ER 20 MEQ PO TBCR: 20.0000 meq | EXTENDED_RELEASE_TABLET | Freq: Every day | ORAL | Status: DC

## 2012-05-16 MED ORDER — WARFARIN SODIUM 10 MG PO TABS
10.0000 mg | ORAL_TABLET | ORAL | Status: AC
  Administered 2012-05-16: 10 mg via ORAL
  Filled 2012-05-16: qty 1

## 2012-05-16 NOTE — Progress Notes (Signed)
ANTICOAGULATION CONSULT NOTE - Follow Up Consult  Pharmacy Consult for heparin Indication: atrial fibrillation  Allergies  Allergen Reactions  . Niacin And Related     Unknown     Patient Measurements: Height: 6' (182.9 cm) Weight: 201 lb 8 oz (91.4 kg) (a scale) IBW/kg (Calculated) : 77.6  Heparin Dosing Weight: 95.6 kg  Vital Signs: Temp: 98.2 F (36.8 C) (11/19 1018) Temp src: Oral (11/19 1018) BP: 121/65 mmHg (11/19 1018) Pulse Rate: 74  (11/19 1018)  Labs:  Basename 05/16/12 0615 05/15/12 1528 05/15/12 0610 05/14/12 0640  HGB 13.9 -- 14.5 --  HCT 42.1 -- 43.3 40.8  PLT 504* -- 501* 482*  APTT -- -- -- --  LABPROT 20.5* -- 19.6* 17.0*  INR 1.83* -- 1.72* 1.42  HEPARINUNFRC 0.45 0.72* 0.20* --  CREATININE -- -- -- --  CKTOTAL -- -- -- --  CKMB -- -- -- --  TROPONINI -- -- -- --    Estimated Creatinine Clearance: 79.4 ml/min (by C-G formula based on Cr of 0.91).   Medications:  Scheduled:     . digoxin  0.25 mg Oral Daily  . doxazosin  8 mg Oral Daily  . furosemide  40 mg Oral Daily  . gemfibrozil  600 mg Oral BID AC  . insulin aspart  0-9 Units Subcutaneous TID WC  . lisinopril  2.5 mg Oral Daily  . metoprolol tartrate  50 mg Oral BID  . potassium chloride  20 mEq Oral BID  . vitamin B-12  1,000 mcg Oral Daily  . [COMPLETED] warfarin  7.5 mg Oral ONCE-1800  . Warfarin - Pharmacist Dosing Inpatient   Does not apply q1800  . [DISCONTINUED] metoprolol tartrate  12.5 mg Oral BID   Infusions:     . heparin 2,200 Units/hr (05/15/12 2108)    Assessment: SOB  Anticoagulation: Afib - on warf PTA stopped for cath (HOME dose 5mg  qday). Heparin level 0.45 in goal and INR increasing to 1.83. CBC stable.  Infectious Diseases: COPD exacerbation - s/p short course of IV Azithro.. Afebrile. WBC 7.7  Neurology: polyneuropathy  Pulmonary: COPD. No respiratory meds.  Cardiovascular: HTN, Afib, CHF (EF 25-30%) - new cardiomyopathy, cath showed non-ischemic  cardiomyopathy - added ACE. 121/65, HR 74. Meds: Digoxin, Lasix, doxazosin, gemfibrozil, lisinopril, K  Gastrointestinal: - No issues  Endocrinology: DM. CBGs 125-230, DM coordinator left recs. Glipizide, metformin on hold. SSI added.  Fluids/Electrolytes/Nutrition: lytes ok  Nephrology: Scr 0.91 (not 1.72 as I noted yesterday in error)  Hematology/Oncology: CBC stable  Best Practices: IV heparin, warfarin  PTA Medication Problems: glipizide, metformin, diltiazem   Goal of Therapy:  Heparin level 0.3-0.7 units/ml Monitor platelets by anticoagulation protocol: Yes   Plan:  Continue heparin at 2200 units/hr until INR>2 Coumadin 10mg  po x 1 tonight   Justin [name], PharmD, BCPS Clinical Staff Pharmacist Pager [phone]  Justin [name] 05/16/2012,11:11 AM

## 2012-05-16 NOTE — Progress Notes (Signed)
Subjective:  No SOB  Objective:  Vital Signs in the last 24 hours: Temp:  [97.8 F (36.6 C)-98.3 F (36.8 C)] 97.8 F (36.6 C) (11/19 0511) Pulse Rate:  [73-150] 73  (11/19 0511) Resp:  [16-18] 18  (11/19 0511) BP: (109-138)/(70-84) 123/84 mmHg (11/19 0511) SpO2:  [96 %-98 %] 98 % (11/19 0511) Weight:  [91.4 kg (201 lb 8 oz)] 91.4 kg (201 lb 8 oz) (11/19 0511)  Intake/Output from previous day:  Intake/Output Summary (Last 24 hours) at 05/16/12 0816 Last data filed at 05/16/12 1610  Gross per 24 hour  Intake    480 ml  Output   3225 ml  Net  -2745 ml    Physical Exam: General appearance: alert, cooperative and no distress Lungs: decreased breath sounds Heart: irregularly irregular rhythm   Rate: 80-120  Rhythm: atrial fibrillation  Lab Results:  Basename 05/16/12 0615 05/15/12 0610  WBC 7.7 7.6  HGB 13.9 14.5  PLT 504* 501*   No results found for this basename: NA:2,K:2,CL:2,CO2:2,GLUCOSE:2,BUN:2,CREATININE:2 in the last 72 hours No results found for this basename: TROPONINI:2,CK,MB:2 in the last 72 hours Hepatic Function Panel No results found for this basename: PROT,ALBUMIN,AST,ALT,ALKPHOS,BILITOT,BILIDIR,IBILI in the last 72 hours No results found for this basename: CHOL in the last 72 hours  Basename 05/16/12 0615  INR 1.83*    Imaging: Imaging results have been reviewed  Cardiac Studies:  Assessment/Plan:   Principal Problem:  *NICM (nonischemic cardiomyopathy) Active Problems:  Diabetes mellitus  Permanent atrial fibrillation  Chronic anticoagulation  HTN (hypertension)  CAD (coronary artery disease), minor by cath 05/12/12   Plan- MD to see, HR better on beta blocker, INR 1.8.   Corine Shelter PA-C 05/16/2012, 8:16 AM    Agree with note written by Corine Shelter [facility]  OK for D/C home. HHC. INR 1.8. Back on BB. LifeVest fitted and explained. ROV with Dr. Salena Saner in [place] in 1-2 weeks.   Runell Gess 05/16/2012 8:21 AM

## 2012-05-16 NOTE — Discharge Summary (Signed)
Patient ID: Justin [name],  MRN: [id number], DOB/AGE: 12/23/1940 71 y.o.  Admit date: 05/08/2012 Discharge date: 05/16/2012  Primary Care Provider: Dr [name] Primary Cardiologist: Dr Royann Shivers  Discharge Diagnoses Principal Problem:  *NICM, EF 25-30% 05/09/12, new c/w March 2012 Active Problems:  Acute systolic CHF (congestive heart failure), 05/08/12  Diabetes mellitus  Permanent atrial fibrillation  Chronic anticoagulation  HTN (hypertension)  Normal coronary arteries by cath 05/12/12    Procedures: Coronary angiogram 05/12/12   Hospital Course;   [name] with a past medical history significant for persistent atrial fibrillation, type 2 diabetes and hypertension. He is maintained on warfarin for anticoagulation. Echocardiogram was last performed on 09/10/2010, which demonstrated yet of 50-55% with borderline apical wall hypokinesis. Stress test was performed on 09/01/2010, which demonstrated normal perfusion, and a mildly dilated left ventricle.  He presented to the [facility] emergency 05/08/12 with increasing shortness of breath for about one week. This was associated with chest tightness cough and congestion. Chest x-ray shows an enlarged cardiac silhouette, with mild chronic bronchitic changes at left basilar scarring. A CT angiogram was performed of the chest, which was negative for PE. There is moderate pulmonary edema and moderate right and small left-sided pleural effusions. Dr Rennis Golden saw the patient in consult and felt he had CHF. Echocardiogram was repeated and showed an EF of 25-30%. Coumadin was helt and he was started on IV Heparin and transferred for diagnostic cath. This was done on 05/12/12 and revealed no significant CAD. He was re-coumadinized and set up for a LifeVest prior to discharge. He remains in AF, Diltiazem was stopped and beta blocker, ACE, and Digoxin added.  He will need to have another echo in 3 months. He will follow up with Dr Royann Shivers in  [place] after discharge.  Discharge Vitals:  Blood pressure 121/65, pulse 74, temperature 98.2 F (36.8 C), temperature source Oral, resp. rate 20, height 6' (1.829 m), weight 91.4 kg (201 lb 8 oz), SpO2 95.00%.    Labs: Results for orders placed during the hospital encounter of 05/08/12 (from the past 48 hour(s))  GLUCOSE, CAPILLARY     Status: Abnormal   Collection Time   05/14/12 11:32 AM      Component Value Range Comment   Glucose-Capillary 203 (*) 70 - 99 mg/dL   GLUCOSE, CAPILLARY     Status: Abnormal   Collection Time   05/14/12  3:53 PM      Component Value Range Comment   Glucose-Capillary 155 (*) 70 - 99 mg/dL    Comment 1 Documented in Chart      Comment 2 Notify RN     GLUCOSE, CAPILLARY     Status: Abnormal   Collection Time   05/14/12 10:18 PM      Component Value Range Comment   Glucose-Capillary 134 (*) 70 - 99 mg/dL    Comment 1 Notify RN      Comment 2 Documented in Chart     PROTIME-INR     Status: Abnormal   Collection Time   05/15/12  6:10 AM      Component Value Range Comment   Prothrombin Time 19.6 (*) 11.6 - 15.2 seconds    INR 1.72 (*) 0.00 - 1.49   CBC     Status: Abnormal   Collection Time   05/15/12  6:10 AM      Component Value Range Comment   WBC 7.6  4.0 - 10.5 K/uL    RBC 4.64  4.22 - 5.81 MIL/uL    Hemoglobin 14.5  13.0 - 17.0 g/dL    HCT 16.1  09.6 - 04.5 %    MCV 93.3  78.0 - 100.0 fL    MCH 31.3  26.0 - 34.0 pg    MCHC 33.5  30.0 - 36.0 g/dL    RDW 40.9  81.1 - 91.4 %    Platelets 501 (*) 150 - 400 K/uL   HEPARIN LEVEL (UNFRACTIONATED)     Status: Abnormal   Collection Time   05/15/12  6:10 AM      Component Value Range Comment   Heparin Unfractionated 0.20 (*) 0.30 - 0.70 IU/mL   GLUCOSE, CAPILLARY     Status: Abnormal   Collection Time   05/15/12  6:40 AM      Component Value Range Comment   Glucose-Capillary 125 (*) 70 - 99 mg/dL   GLUCOSE, CAPILLARY     Status: Abnormal   Collection Time   05/15/12 11:19 AM       Component Value Range Comment   Glucose-Capillary 230 (*) 70 - 99 mg/dL    Comment 1 Notify RN     HEPARIN LEVEL (UNFRACTIONATED)     Status: Abnormal   Collection Time   05/15/12  3:28 PM      Component Value Range Comment   Heparin Unfractionated 0.72 (*) 0.30 - 0.70 IU/mL   GLUCOSE, CAPILLARY     Status: Abnormal   Collection Time   05/15/12  4:34 PM      Component Value Range Comment   Glucose-Capillary 138 (*) 70 - 99 mg/dL    Comment 1 Notify RN     HEPARIN LEVEL (UNFRACTIONATED)     Status: Normal   Collection Time   05/16/12  6:15 AM      Component Value Range Comment   Heparin Unfractionated 0.45  0.30 - 0.70 IU/mL   CBC     Status: Abnormal   Collection Time   05/16/12  6:15 AM      Component Value Range Comment   WBC 7.7  4.0 - 10.5 K/uL    RBC 4.58  4.22 - 5.81 MIL/uL    Hemoglobin 13.9  13.0 - 17.0 g/dL    HCT 78.2  95.6 - 21.3 %    MCV 91.9  78.0 - 100.0 fL    MCH 30.3  26.0 - 34.0 pg    MCHC 33.0  30.0 - 36.0 g/dL    RDW 08.6  57.8 - 46.9 %    Platelets 504 (*) 150 - 400 K/uL   PROTIME-INR     Status: Abnormal   Collection Time   05/16/12  6:15 AM      Component Value Range Comment   Prothrombin Time 20.5 (*) 11.6 - 15.2 seconds    INR 1.83 (*) 0.00 - 1.49     Disposition:  Follow-up Information    Follow up with Thurmon Fair, MD. [facility] office will call you)    Contact information:   [address] [place] [address] [phone]          Discharge Medications:    Medication List     As of 05/16/2012 10:29 AM    STOP taking these medications         diltiazem 240 MG 24 hr capsule   Commonly known as: DILACOR XR      TAKE these medications         CINNAMON PO  Take 1,000 mg by mouth daily.      digoxin 0.25 MG tablet   Commonly known as: LANOXIN   Take 1 tablet (0.25 mg total) by mouth daily.      doxazosin 8 MG tablet   Commonly known as: CARDURA   Take 8 mg by mouth daily.      furosemide 40 MG  tablet   Commonly known as: LASIX   Take 1 tablet (40 mg total) by mouth daily.      gemfibrozil 600 MG tablet   Commonly known as: LOPID   Take 600 mg by mouth 2 (two) times daily before a meal.      glipiZIDE 10 MG tablet   Commonly known as: GLUCOTROL   Take 10 mg by mouth daily.      lisinopril 2.5 MG tablet   Commonly known as: PRINIVIL,ZESTRIL   Take 1 tablet (2.5 mg total) by mouth daily.      metFORMIN 1000 MG tablet   Commonly known as: GLUCOPHAGE   Take 1,000 mg by mouth 2 (two) times daily.      metoprolol 50 MG tablet   Commonly known as: LOPRESSOR   Take 1 tablet (50 mg total) by mouth 2 (two) times daily.      oxybutynin 5 MG tablet   Commonly known as: DITROPAN   Take 5 mg by mouth 3 (three) times daily as needed.      potassium chloride SA 20 MEQ tablet   Commonly known as: K-DUR,KLOR-CON   Take 1 tablet (20 mEq total) by mouth daily.      PRESCRIPTION MEDICATION   Take 15 mLs by mouth every 6 (six) hours as needed. Cough. Apothecary Cough Syrup with Honey.      PROAIR HFA 108 (90 BASE) MCG/ACT inhaler   Generic drug: albuterol   Inhale 2 puffs into the lungs every 6 (six) hours as needed. For shortness of breath      vitamin B-12 1000 MCG tablet   Commonly known as: CYANOCOBALAMIN   Take 1,000 mcg by mouth daily.      warfarin 5 MG tablet   Commonly known as: COUMADIN   Take 5 mg by mouth daily.         Duration of Discharge Encounter: Greater than 30 minutes including physician time.  Jolene Provost PA-C 05/16/2012 10:29 AM

## 2012-06-19 ENCOUNTER — Encounter: Payer: Self-pay | Admitting: Cardiovascular Disease

## 2012-07-05 ENCOUNTER — Other Ambulatory Visit (HOSPITAL_COMMUNITY): Payer: Self-pay | Admitting: Cardiovascular Disease

## 2012-07-05 DIAGNOSIS — I4891 Unspecified atrial fibrillation: Secondary | ICD-10-CM

## 2012-07-05 DIAGNOSIS — I509 Heart failure, unspecified: Secondary | ICD-10-CM

## 2012-07-05 DIAGNOSIS — R931 Abnormal findings on diagnostic imaging of heart and coronary circulation: Secondary | ICD-10-CM

## 2012-07-17 ENCOUNTER — Ambulatory Visit (HOSPITAL_COMMUNITY)
Admission: RE | Admit: 2012-07-17 | Discharge: 2012-07-17 | Disposition: A | Discharge status: Home / Self Care | Payer: Medicare Other | Source: Ambulatory Visit | Attending: Cardiovascular Disease | Admitting: Cardiovascular Disease

## 2012-07-17 DIAGNOSIS — I509 Heart failure, unspecified: Secondary | ICD-10-CM | POA: Insufficient documentation

## 2012-07-17 DIAGNOSIS — I369 Nonrheumatic tricuspid valve disorder, unspecified: Secondary | ICD-10-CM | POA: Insufficient documentation

## 2012-07-17 DIAGNOSIS — R931 Abnormal findings on diagnostic imaging of heart and coronary circulation: Secondary | ICD-10-CM

## 2012-07-17 DIAGNOSIS — I4891 Unspecified atrial fibrillation: Secondary | ICD-10-CM | POA: Insufficient documentation

## 2012-07-17 DIAGNOSIS — I080 Rheumatic disorders of both mitral and aortic valves: Secondary | ICD-10-CM | POA: Insufficient documentation

## 2012-07-17 NOTE — Progress Notes (Signed)
2D Echo Performed 07/17/2012    Rhyland Hinderliter, RCS  

## 2012-07-24 ENCOUNTER — Other Ambulatory Visit: Payer: Self-pay | Admitting: Urology

## 2012-08-03 ENCOUNTER — Encounter (HOSPITAL_COMMUNITY): Payer: Self-pay | Admitting: Pharmacy Technician

## 2012-08-08 ENCOUNTER — Encounter (HOSPITAL_COMMUNITY): Payer: Self-pay

## 2012-08-08 ENCOUNTER — Ambulatory Visit (HOSPITAL_COMMUNITY)
Admission: RE | Admit: 2012-08-08 | Discharge: 2012-08-08 | Disposition: A | Discharge status: Home / Self Care | Payer: Medicare Other | Source: Ambulatory Visit | Attending: Urology | Admitting: Urology

## 2012-08-08 ENCOUNTER — Encounter (HOSPITAL_COMMUNITY)
Admission: RE | Admit: 2012-08-08 | Discharge: 2012-08-08 | Disposition: A | Discharge status: Home / Self Care | Payer: Medicare Other | Source: Ambulatory Visit | Attending: Urology | Admitting: Urology

## 2012-08-08 DIAGNOSIS — Z01812 Encounter for preprocedural laboratory examination: Secondary | ICD-10-CM | POA: Insufficient documentation

## 2012-08-08 DIAGNOSIS — J984 Other disorders of lung: Secondary | ICD-10-CM | POA: Insufficient documentation

## 2012-08-08 DIAGNOSIS — R339 Retention of urine, unspecified: Secondary | ICD-10-CM | POA: Insufficient documentation

## 2012-08-08 DIAGNOSIS — I517 Cardiomegaly: Secondary | ICD-10-CM | POA: Insufficient documentation

## 2012-08-08 HISTORY — DX: Other specified disorders of nose and nasal sinuses: J34.89

## 2012-08-08 HISTORY — DX: Heart failure, unspecified: I50.9

## 2012-08-08 HISTORY — DX: Retention of urine, unspecified: R33.9

## 2012-08-08 HISTORY — DX: Unspecified hearing loss, unspecified ear: H91.90

## 2012-08-08 LAB — SURGICAL PCR SCREEN: Staphylococcus aureus: POSITIVE — AB

## 2012-08-08 LAB — BASIC METABOLIC PANEL
BUN: 22 mg/dL (ref 6–23)
Calcium: 9.7 mg/dL (ref 8.4–10.5)
Chloride: 98 mEq/L (ref 96–112)
Creatinine, Ser: 0.85 mg/dL (ref 0.50–1.35)
GFR calc Af Amer: >90 mL/min (ref >90)

## 2012-08-08 LAB — CBC
HCT: 43 % (ref 39.0–52.0)
Hemoglobin: 14.5 g/dL (ref 13.0–17.0)
MCV: 90.7 fL (ref 78.0–100.0)
WBC: 10.7 10*3/uL — ABNORMAL HIGH (ref 4.0–10.5)

## 2012-08-08 NOTE — Progress Notes (Signed)
OV with EKG 12/12 Dr Royann Shivers on chart, last stress test 3/12,, eccho 1/14 on chart. Cath 11/13 [facility]. Wearing life vest- [employer]. When reviewing medicines, Dilitizem listed on meds sheet, and per cardiology note, but wife and patient do not think he is taking this. To call me when arrives home for verification

## 2012-08-08 NOTE — Patient Instructions (Addendum)
20 Justin [name]  08/08/2012   Your procedure is scheduled on:  08/14/12 MONDAY  Report to [facility] at       [phone]  Call this number if you have problems the morning of surgery: [phone]       BRING YOUR INHALERS WITH YOU TO HOSPITAL                                                 Do not eat food after midnight Sunday NIGHT       MAY HAVE CLEAR LIQUIDS Monday MORNING UNTIL [id number] Monday MORNING-  THEN NOTHING BY MOUTH    Take these medicines the morning of surgery with A SIP OF WATER:  METOPROLOL,  DIGOXIN           NORCO IF NEEDED, ADVAIR IF  NEEDED DO NOT TAKE ANY DIABETES MEDICINES MORNING OF SURGERY  .  Contacts, dentures or partial plates can not be worn to surgery  Leave suitcase in the car. After surgery it may be brought to your room.  For patients admitted to the hospital, checkout time is 11:00 AM day of  discharge.             SPECIAL INSTRUCTIONS- SEE [place] PREPARING FOR SURGERY INSTRUCTION SHEET-     DO NOT WEAR JEWELRY, LOTIONS, POWDERS, OR PERFUMES.  WOMEN-- DO NOT SHAVE LEGS OR UNDERARMS FOR 12 HOURS BEFORE SHOWERS. MEN MAY SHAVE FACE.  Patients discharged the day of surgery will not be allowed to drive home. IF going home the day of surgery, you must have a driver and someone to stay with you for the first 24 hours  Name and phone number of your driver:   Wife   Justin [name]                                                                     Please read over the following fact sheets that you were given: MRSA Information, Incentive Spirometry Sheet, Blood Transfusion Sheet  Information                                                                                   Justin [name]  PST 336  [phone]                 FAILURE TO FOLLOW THESE INSTRUCTIONS MAY RESULT IN  CANCELLATION   OF YOUR SURGERY                                                  Patient Signature _____________________________

## 2012-08-08 NOTE — Progress Notes (Signed)
Spoke with wife who states is on her way to cardiologist office to hopefully clear up the questions regarding medication.  Notified her of pos staph and to get Mupirocin filled and begin today

## 2012-08-08 NOTE — Progress Notes (Signed)
Left message earlier at pts home to call me regarding diltiazem usage- need clarification.   History reviewed by Dr Rica Mast- stated needs more information from cardiologist regarding what medication he is to be on and that I   Cannot obtain this from family. Dr Rica Mast stated to repeat EKG Am of OR if one isnt done by cardiologist before then.  Dr Rica Mast also requests clarification of when life vest is to be applied and if there is any further care.  Spoke with C Little at Dr Peggyann Shoals office at 1540 and relayed this to her.  Did leave message  Requesting  wife to call regarding pos staph PCR as well

## 2012-08-08 NOTE — Progress Notes (Signed)
CLEARANCE WITH NOTE DR Croituri chart, EKG, Chest x ray ( effusion) 11/13 EPIC

## 2012-08-08 NOTE — Progress Notes (Signed)
08/08/12 1302  OBSTRUCTIVE SLEEP APNEA  Have you ever been diagnosed with sleep apnea through a sleep study? No  Do you snore loudly (loud enough to be heard through closed doors)?  0  Do you often feel tired, fatigued, or sleepy during the daytime? 1  Has anyone observed you stop breathing during your sleep? 0  Do you have, or are you being treated for high blood pressure? 1  BMI more than 35 kg/m2? 0  Age over 74 years old? 1  Neck circumference greater than 40 cm/18 inches? 0  Gender: 1  Obstructive Sleep Apnea Score 4  Score 4 or greater  Results sent to PCP

## 2012-08-09 ENCOUNTER — Other Ambulatory Visit: Payer: Self-pay | Admitting: Urology

## 2012-08-09 NOTE — Progress Notes (Signed)
Per Chastity at [facility] at 1100 this am- she had spoken with Harriett Sine at Dr Royann Shivers office in [place]-  Patient is to be taking Diltiazem and order has been sent to [employer] that LIFE VEST WILL BE DISCONTINUED as of today and they will pick this up from patient. Asked Chastity to confirm with cardiologist if it is OK to proceed with surgery as hasnt been on Diltiazem as per Dr Marcelline Deist request 08/08/12 . Stated she would contact office for the OK and fax to [place].  Spoke with patients wife at 1200 who stated that her husband will start on Diltiazem when it is delivered to them today.  INSTRUCTED HER FOR HIM TO TAKE THIS THE MORNING OF SURGERY ALONG WITH HIS METOPROLOL, AND DIGOXIN. Verbalized understanding

## 2012-08-14 ENCOUNTER — Encounter (HOSPITAL_COMMUNITY)
Admission: RE | Disposition: A | Discharge status: Home / Self Care | Payer: Self-pay | Source: Ambulatory Visit | Attending: Urology

## 2012-08-14 ENCOUNTER — Observation Stay (HOSPITAL_COMMUNITY)
Admission: RE | Admit: 2012-08-14 | Discharge: 2012-08-15 | Disposition: A | Discharge status: Home / Self Care | Payer: Medicare Other | Source: Ambulatory Visit | Attending: Urology | Admitting: Urology

## 2012-08-14 ENCOUNTER — Encounter (HOSPITAL_COMMUNITY): Payer: Self-pay | Admitting: Anesthesiology

## 2012-08-14 ENCOUNTER — Encounter (HOSPITAL_COMMUNITY): Payer: Self-pay | Admitting: *Deleted

## 2012-08-14 ENCOUNTER — Ambulatory Visit (HOSPITAL_COMMUNITY): Payer: Medicare Other | Admitting: Anesthesiology

## 2012-08-14 DIAGNOSIS — E7800 Pure hypercholesterolemia, unspecified: Secondary | ICD-10-CM | POA: Insufficient documentation

## 2012-08-14 DIAGNOSIS — N320 Bladder-neck obstruction: Secondary | ICD-10-CM | POA: Insufficient documentation

## 2012-08-14 DIAGNOSIS — I509 Heart failure, unspecified: Secondary | ICD-10-CM | POA: Insufficient documentation

## 2012-08-14 DIAGNOSIS — R339 Retention of urine, unspecified: Secondary | ICD-10-CM

## 2012-08-14 DIAGNOSIS — E119 Type 2 diabetes mellitus without complications: Secondary | ICD-10-CM | POA: Insufficient documentation

## 2012-08-14 DIAGNOSIS — N138 Other obstructive and reflux uropathy: Principal | ICD-10-CM | POA: Insufficient documentation

## 2012-08-14 DIAGNOSIS — N401 Enlarged prostate with lower urinary tract symptoms: Secondary | ICD-10-CM | POA: Insufficient documentation

## 2012-08-14 DIAGNOSIS — M109 Gout, unspecified: Secondary | ICD-10-CM | POA: Insufficient documentation

## 2012-08-14 DIAGNOSIS — Z7901 Long term (current) use of anticoagulants: Secondary | ICD-10-CM | POA: Insufficient documentation

## 2012-08-14 DIAGNOSIS — Z79899 Other long term (current) drug therapy: Secondary | ICD-10-CM | POA: Insufficient documentation

## 2012-08-14 DIAGNOSIS — I10 Essential (primary) hypertension: Secondary | ICD-10-CM | POA: Insufficient documentation

## 2012-08-14 HISTORY — PX: CYSTOSCOPY: SHX5120

## 2012-08-14 HISTORY — PX: GREEN LIGHT LASER TURP (TRANSURETHRAL RESECTION OF PROSTATE: SHX6260

## 2012-08-14 LAB — PROTIME-INR
INR: 1.09 (ref 0.00–1.49)
Prothrombin Time: 14 seconds (ref 11.6–15.2)

## 2012-08-14 LAB — GLUCOSE, CAPILLARY: Glucose-Capillary: 169 mg/dL — ABNORMAL HIGH (ref 70–99)

## 2012-08-14 SURGERY — CYSTOSCOPY
Anesthesia: General | Site: Prostate | Wound class: Clean Contaminated

## 2012-08-14 MED ORDER — BELLADONNA ALKALOIDS-OPIUM 16.2-60 MG RE SUPP
RECTAL | Status: AC
  Filled 2012-08-14: qty 1

## 2012-08-14 MED ORDER — DIGOXIN 250 MCG PO TABS
0.2500 mg | ORAL_TABLET | Freq: Every day | ORAL | Status: DC
  Filled 2012-08-14 (×2): qty 1

## 2012-08-14 MED ORDER — LISINOPRIL 2.5 MG PO TABS
2.5000 mg | ORAL_TABLET | Freq: Every day | ORAL | Status: DC
  Administered 2012-08-15: 2.5 mg via ORAL
  Filled 2012-08-14 (×2): qty 1

## 2012-08-14 MED ORDER — LIDOCAINE HCL 2 % EX GEL
CUTANEOUS | Status: AC
  Filled 2012-08-14: qty 10

## 2012-08-14 MED ORDER — GLYCOPYRROLATE 0.2 MG/ML IJ SOLN
INTRAMUSCULAR | Status: DC | PRN
  Administered 2012-08-14: 0.2 mg via INTRAVENOUS

## 2012-08-14 MED ORDER — FENTANYL CITRATE 0.05 MG/ML IJ SOLN
INTRAMUSCULAR | Status: DC | PRN
  Administered 2012-08-14: 50 ug via INTRAVENOUS
  Administered 2012-08-14 (×2): 25 ug via INTRAVENOUS

## 2012-08-14 MED ORDER — POTASSIUM CHLORIDE CRYS ER 20 MEQ PO TBCR
20.0000 meq | EXTENDED_RELEASE_TABLET | Freq: Every day | ORAL | Status: DC
  Filled 2012-08-14: qty 1

## 2012-08-14 MED ORDER — SODIUM CHLORIDE 0.9 % IR SOLN
Status: DC | PRN
  Administered 2012-08-14: 30000 mL

## 2012-08-14 MED ORDER — METOPROLOL TARTRATE 50 MG PO TABS
50.0000 mg | ORAL_TABLET | Freq: Two times a day (BID) | ORAL | Status: DC
  Administered 2012-08-14: 50 mg via ORAL
  Filled 2012-08-14 (×3): qty 1

## 2012-08-14 MED ORDER — CIPROFLOXACIN IN D5W 400 MG/200ML IV SOLN
400.0000 mg | Freq: Two times a day (BID) | INTRAVENOUS | Status: DC
  Administered 2012-08-15: 400 mg via INTRAVENOUS
  Filled 2012-08-14 (×2): qty 200

## 2012-08-14 MED ORDER — SODIUM CHLORIDE 0.9 % IJ SOLN: 3.0000 mL | Freq: Two times a day (BID) | INTRAMUSCULAR | Status: DC

## 2012-08-14 MED ORDER — POTASSIUM CHLORIDE CRYS ER 20 MEQ PO TBCR
20.0000 meq | EXTENDED_RELEASE_TABLET | Freq: Every day | ORAL | Status: DC
  Administered 2012-08-15: 20 meq via ORAL
  Filled 2012-08-14: qty 1

## 2012-08-14 MED ORDER — PROMETHAZINE HCL 25 MG/ML IJ SOLN: 6.2500 mg | INTRAMUSCULAR | Status: DC | PRN

## 2012-08-14 MED ORDER — LACTATED RINGERS IV SOLN
INTRAVENOUS | Status: DC
  Administered 2012-08-14: 1000 mL via INTRAVENOUS

## 2012-08-14 MED ORDER — HYDROCODONE-ACETAMINOPHEN 5-325 MG PO TABS: 1.0000 | ORAL_TABLET | ORAL | Status: DC | PRN

## 2012-08-14 MED ORDER — KETOROLAC TROMETHAMINE 30 MG/ML IJ SOLN
INTRAMUSCULAR | Status: AC
  Filled 2012-08-14: qty 1

## 2012-08-14 MED ORDER — ALBUTEROL SULFATE HFA 108 (90 BASE) MCG/ACT IN AERS: 2.0000 | INHALATION_SPRAY | Freq: Four times a day (QID) | RESPIRATORY_TRACT | Status: DC | PRN

## 2012-08-14 MED ORDER — BELLADONNA ALKALOIDS-OPIUM 16.2-60 MG RE SUPP
RECTAL | Status: DC | PRN
  Administered 2012-08-14: 1 via RECTAL

## 2012-08-14 MED ORDER — DILTIAZEM HCL ER 240 MG PO CP24
240.0000 mg | ORAL_CAPSULE | Freq: Every day | ORAL | Status: DC
  Administered 2012-08-15: 240 mg via ORAL
  Filled 2012-08-14: qty 1

## 2012-08-14 MED ORDER — FENTANYL CITRATE 0.05 MG/ML IJ SOLN: 25.0000 ug | INTRAMUSCULAR | Status: DC | PRN

## 2012-08-14 MED ORDER — DOCUSATE SODIUM 100 MG PO CAPS: 100.0000 mg | ORAL_CAPSULE | Freq: Two times a day (BID) | ORAL | Status: DC

## 2012-08-14 MED ORDER — PROPOFOL 10 MG/ML IV BOLUS
INTRAVENOUS | Status: DC | PRN
  Administered 2012-08-14: 150 mg via INTRAVENOUS

## 2012-08-14 MED ORDER — SODIUM CHLORIDE 0.9 % IV SOLN: 250.0000 mL | INTRAVENOUS | Status: DC | PRN

## 2012-08-14 MED ORDER — ACETAMINOPHEN 10 MG/ML IV SOLN
INTRAVENOUS | Status: AC
  Filled 2012-08-14: qty 100

## 2012-08-14 MED ORDER — INDIGOTINDISULFONATE SODIUM 8 MG/ML IJ SOLN
INTRAMUSCULAR | Status: DC | PRN
  Administered 2012-08-14: 5 mL via INTRAVENOUS

## 2012-08-14 MED ORDER — HYDROCODONE-ACETAMINOPHEN 5-325 MG PO TABS: 1.0000 | ORAL_TABLET | Freq: Four times a day (QID) | ORAL | Status: DC | PRN

## 2012-08-14 MED ORDER — CIPROFLOXACIN IN D5W 400 MG/200ML IV SOLN
INTRAVENOUS | Status: AC
  Filled 2012-08-14: qty 200

## 2012-08-14 MED ORDER — CIPROFLOXACIN HCL 500 MG PO TABS: 500.0000 mg | ORAL_TABLET | Freq: Two times a day (BID) | ORAL | Status: DC

## 2012-08-14 MED ORDER — ACETAMINOPHEN 10 MG/ML IV SOLN
INTRAVENOUS | Status: DC | PRN
  Administered 2012-08-14: 1000 mg via INTRAVENOUS

## 2012-08-14 MED ORDER — PIPERACILLIN-TAZOBACTAM 3.375 G IVPB 30 MIN
3.3750 g | Freq: Once | INTRAVENOUS | Status: AC
  Administered 2012-08-14: 3.375 g via INTRAVENOUS
  Filled 2012-08-14: qty 50

## 2012-08-14 MED ORDER — CIPROFLOXACIN IN D5W 400 MG/200ML IV SOLN
400.0000 mg | INTRAVENOUS | Status: AC
  Administered 2012-08-14: 400 mg via INTRAVENOUS

## 2012-08-14 MED ORDER — DIPHENHYDRAMINE HCL 50 MG/ML IJ SOLN: 12.5000 mg | Freq: Four times a day (QID) | INTRAMUSCULAR | Status: DC | PRN

## 2012-08-14 MED ORDER — SODIUM CHLORIDE 0.9 % IJ SOLN: 3.0000 mL | INTRAMUSCULAR | Status: DC | PRN

## 2012-08-14 MED ORDER — GEMFIBROZIL 600 MG PO TABS
600.0000 mg | ORAL_TABLET | Freq: Two times a day (BID) | ORAL | Status: DC
  Administered 2012-08-15: 600 mg via ORAL
  Filled 2012-08-14 (×3): qty 1

## 2012-08-14 MED ORDER — KETOROLAC TROMETHAMINE 30 MG/ML IJ SOLN
15.0000 mg | Freq: Once | INTRAMUSCULAR | Status: AC | PRN
  Administered 2012-08-14: 30 mg via INTRAVENOUS

## 2012-08-14 MED ORDER — INSULIN ASPART 100 UNIT/ML ~~LOC~~ SOLN
0.0000 [IU] | SUBCUTANEOUS | Status: DC
  Administered 2012-08-14: 2 [IU] via SUBCUTANEOUS
  Administered 2012-08-15: 3 [IU] via SUBCUTANEOUS
  Administered 2012-08-15: 5 [IU] via SUBCUTANEOUS
  Administered 2012-08-15: 3 [IU] via SUBCUTANEOUS

## 2012-08-14 MED ORDER — FUROSEMIDE 40 MG PO TABS
40.0000 mg | ORAL_TABLET | Freq: Every morning | ORAL | Status: DC
  Administered 2012-08-15: 40 mg via ORAL
  Filled 2012-08-14: qty 1

## 2012-08-14 MED ORDER — DIPHENHYDRAMINE HCL 12.5 MG/5ML PO ELIX
12.5000 mg | ORAL_SOLUTION | Freq: Four times a day (QID) | ORAL | Status: DC | PRN
  Administered 2012-08-15: 12.5 mg via ORAL
  Filled 2012-08-14: qty 5

## 2012-08-14 MED ORDER — DOCUSATE SODIUM 100 MG PO CAPS
100.0000 mg | ORAL_CAPSULE | Freq: Two times a day (BID) | ORAL | Status: DC
  Administered 2012-08-14 – 2012-08-15 (×2): 100 mg via ORAL
  Filled 2012-08-14 (×3): qty 1

## 2012-08-14 MED ORDER — ZOLPIDEM TARTRATE 5 MG PO TABS: 5.0000 mg | ORAL_TABLET | Freq: Every evening | ORAL | Status: DC | PRN

## 2012-08-14 MED ORDER — SODIUM CHLORIDE 0.45 % IV SOLN
INTRAVENOUS | Status: DC
  Administered 2012-08-14: 20:00:00 via INTRAVENOUS

## 2012-08-14 MED ORDER — PIPERACILLIN-TAZOBACTAM 3.375 G IVPB
3.3750 g | Freq: Three times a day (TID) | INTRAVENOUS | Status: DC
  Administered 2012-08-14 – 2012-08-15 (×2): 3.375 g via INTRAVENOUS
  Filled 2012-08-14 (×3): qty 50

## 2012-08-14 SURGICAL SUPPLY — 22 items
BAG URINE DRAINAGE (UROLOGICAL SUPPLIES) ×6 IMPLANT
BAG URO CATCHER STRL LF (DRAPE) ×3 IMPLANT
CATH FOLEY 2WAY SLVR  5CC 18FR (CATHETERS)
CATH FOLEY 2WAY SLVR 5CC 18FR (CATHETERS) IMPLANT
CATH HEMATURIA 20FR (CATHETERS) ×3 IMPLANT
CLOTH BEACON ORANGE TIMEOUT ST (SAFETY) ×3 IMPLANT
DRAPE CAMERA CLOSED 9X96 (DRAPES) ×3 IMPLANT
ELECT BUTTON HF 24-28F 2 30DE (ELECTRODE) IMPLANT
ELECT LOOP MED HF 24F 12D (CUTTING LOOP) IMPLANT
ELECT LOOP MED HF 24F 12D CBL (CLIP) IMPLANT
ELECT RESECT VAPORIZE 12D CBL (ELECTRODE) IMPLANT
GLOVE BIOGEL M STRL SZ7.5 (GLOVE) ×3 IMPLANT
GOWN STRL NON-REIN LRG LVL3 (GOWN DISPOSABLE) ×6 IMPLANT
GOWN STRL REIN XL XLG (GOWN DISPOSABLE) ×3 IMPLANT
HOLDER FOLEY CATH W/STRAP (MISCELLANEOUS) IMPLANT
LASER FIBER /GREENLIGHT LASER (Laser) ×3 IMPLANT
LASER GREENLIGHT RENTAL P/PROC (Laser) ×3 IMPLANT
MANIFOLD NEPTUNE II (INSTRUMENTS) ×6 IMPLANT
PACK CYSTO (CUSTOM PROCEDURE TRAY) ×6 IMPLANT
SYR 30ML LL (SYRINGE) IMPLANT
SYRINGE IRR TOOMEY STRL 70CC (SYRINGE) ×3 IMPLANT
TUBING CONNECTING 10 (TUBING) ×3 IMPLANT

## 2012-08-14 NOTE — H&P (Signed)
  History of Present Illness  Justin [name] is a 72 year old with the following urologic history:  1) Urinary retention: He was initially seen as a hospital consult in September 2013 for urinary retention. He had denied bothersome baseline voiding symptoms at baseline. He had one prior catheterization years ago when he was involved in a trauma.  He failed one voiding trial in the hospital despite alpha blocker therapy. He did finally pass a voiding trial in early October 2013 but subsequently again developed urinary retention. 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 After discussing options, he elected to proceed with TUVP.  Due to his history of atrial fibrillation and hospital admission for presumed CHF, his surgical therapy was delayed until he could undergo further cardiac evaluation by his cardiologist. He did and is felt to be stable from a cardiac standpoint to proceed with surgery at this time.    Past Medical History Problems  1. History of  Arthritis V13.4 2. History of  Atrial Fibrillation 427.31 3. History of  Diabetes Mellitus 250.00 4. History of  Gout 274.9 5. History of  Hypercholesterolemia 272.0 6. History of  Hypertension 401.9 7. History of  Murmurs 785.2  Surgical History Problems  1. History of  Leg Repair 2. History of  Pelvic Repair 3. History of  Rotator Cuff Repair  Current Meds 1. Coumadin TABS; Therapy: (Recorded:20Dec2013) to 2. Diltiazem HCl ER Coated Beads 240 MG Oral Capsule Extended Release 24 Hour; Therapy:  27Sep2013 to 3. Doxazosin Mesylate 8 MG Oral Tablet; Therapy: 26Sep2012 to 4. Gemfibrozil TABS; Therapy: (Recorded:11Oct2013) to 5. GlipiZIDE 10 MG Oral Tablet; Therapy: 04Feb2013 to 6. Hydrocodone-Acetaminophen 5-325 MG Oral Tablet; Therapy: 11Sep2013 to 7. MetFORMIN HCl TABS;  Therapy: (Recorded:11Oct2013) to 8. Oxybutynin Chloride 5 MG Oral Tablet; TAKE 1 TABLET Every 8 hours PRN bladder spasm;  Therapy: 30Oct2013 to (Last Rx:30Oct2013)  Requested for: 30Oct2013  Allergies Medication  1. Niacin TABS  Family History Problems  1. Family history of  Diabetes Mellitus V18.0 2. Paternal history of  Lung Cancer V16.1  Social History Problems  1. Never A Smoker    Physical Exam Constitutional: Well nourished and well developed . No acute distress.  ENT:. The ears and nose are normal in appearance.  Neck: The appearance of the neck is normal and no neck mass is present.  Pulmonary: No respiratory distress.  Cardiovascular:. No peripheral edema.  Genitourinary: Examination of the penis demonstrates no lesions and a normal meatus.  Neuro/Psych:. Mood and affect are appropriate.    Discussion/Summary  1. 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 He does have a median lobe protruding into the bladder. He is on maximal alpha blocker therapy and has failed voiding trials. At this point, I have discussed surgical treatment as an option.  We have discussed options and he has elected to proceed with TUVP. He has been evaluated by his cardiologist thoroughly and is felt to be at acceptable risk.  Justin [name] strongly wishes to proceed with surgery to avoid long term catheter management of his bladder outlet obstruction.   We have reviewed the potential risks, complications, alternative options, and expected recovery process.  He gives his informed consent.

## 2012-08-14 NOTE — Transfer of Care (Signed)
Immediate Anesthesia Transfer of Care Note  Patient: Justin [name]  Procedure(s) Performed: Procedure(s): CYSTOSCOPY (N/A) GREEN LIGHT LASER TURP (TRANSURETHRAL RESECTION OF PROSTATE (N/A)  Patient Location: PACU  Anesthesia Type:General  Level of Consciousness: awake, alert , sedated and patient cooperative  Airway & Oxygen Therapy: Patient Spontanous Breathing and Patient connected to face mask oxygen  Post-op Assessment: Report given to PACU RN and Post -op Vital signs reviewed and stable  Post vital signs: Reviewed and stable  Complications: No apparent anesthesia complications

## 2012-08-14 NOTE — Op Note (Signed)
Preoperative diagnosis: 1. Bladder outlet obstruction secondary to BPH  Postoperative diagnosis:  1. Bladder outlet obstruction secondary to BPH  Procedure:  1. Cystoscopy 2. Transurethral laser /vaporization of the prostate  Surgeon: Moody Bruins. M.D.  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 After reviewing the management options for treatment, he elected to proceed with the above surgical procedure(s). We have discussed the potential benefits and risks of the procedure, side effects of the proposed treatment, the likelihood of the patient achieving the goals of the procedure, and any potential problems that might occur during the procedure or recuperation. Informed consent has been obtained.  Description of procedure:  The patient was taken to the operating room and general anesthesia was induced.  The patient was placed in the dorsal lithotomy position, his indwelling catheter was removed, and he was prepped and draped in the usual sterile fashion, and preoperative antibiotics were administered. A preoperative time-out was performed.   Cystourethroscopy was performed.  The patient's urethra was examined and the prostatic urethra demonstrated trilobar hypertrophy with a large obstructing median lobe. Prostatic urethral length was approximately 3.5 cm.   The bladder was then systematically examined in its entirety. There was no evidence of any bladder tumors, stones, or other mucosal pathology excepting for mild trabeculation and small diverticuli.  The ureteral orifices were identified so as to be avoided during the procedure.  The prostate adenoma was then vaporized utilizing the XPS GreenLight laser.  The large median lobe prostate adenoma from the bladder neck back to the verumontanum was vaporized beginning at the six o'clock position and then  extended to include the right and left lobes of the prostate and anterior prostate. Additional vaporization was then carried out of the lateral lobes. Care was taken not to vaporize distal to the verumontanum. Total laser time was 57:00 minutes. [phone] joules were used with vaporization performed at 80-120 W.   Hemostasis was then achieved with the cautery and the bladder was emptied and reinspected with no significant bleeding noted at the end of the procedure.    A 3 way catheter was then placed into the bladder and placed on continuous bladder irrigation.  The patient appeared to tolerate the procedure well and without complications.  The patient was able to be awakened and transferred to the recovery unit in satisfactory condition.

## 2012-08-14 NOTE — Anesthesia Preprocedure Evaluation (Addendum)
Anesthesia Evaluation  Patient identified by MRN, date of birth, ID band Patient awake    Reviewed: Allergy & Precautions, H&P , NPO status , Patient's Chart, lab work & pertinent test results  Airway Mallampati: III TM Distance: <3 FB Neck ROM: Full    Dental no notable dental hx.    Pulmonary neg pulmonary ROS,  breath sounds clear to auscultation  Pulmonary exam normal       Cardiovascular hypertension, Pt. on medications +CHF + dysrhythmias Atrial Fibrillation Rhythm:Irregular Rate:Normal + Systolic murmurs    Neuro/Psych negative neurological ROS  negative psych ROS   GI/Hepatic negative GI ROS, Neg liver ROS,   Endo/Other  diabetes, Type 2  Renal/GU negative Renal ROS  negative genitourinary   Musculoskeletal negative musculoskeletal ROS (+)   Abdominal   Peds negative pediatric ROS (+)  Hematology negative hematology ROS (+)   Anesthesia Other Findings   Reproductive/Obstetrics negative OB ROS                         Anesthesia Physical Anesthesia Plan  ASA: III  Anesthesia Plan: General   Post-op Pain Management:    Induction: Intravenous  Airway Management Planned: LMA  Additional Equipment:   Intra-op Plan:   Post-operative Plan:   Informed Consent: I have reviewed the patients History and Physical, chart, labs and discussed the procedure including the risks, benefits and alternatives for the proposed anesthesia with the patient or authorized representative who has indicated his/her understanding and acceptance.   Dental advisory given  Plan Discussed with: CRNA and Surgeon  Anesthesia Plan Comments:         Anesthesia Quick Evaluation

## 2012-08-14 NOTE — Anesthesia Postprocedure Evaluation (Signed)
  Anesthesia Post-op Note  Patient: Justin [name]  Procedure(s) Performed: Procedure(s) (LRB): CYSTOSCOPY (N/A) GREEN LIGHT LASER TURP (TRANSURETHRAL RESECTION OF PROSTATE (N/A)  Patient Location: PACU  Anesthesia Type: General  Level of Consciousness: awake and alert   Airway and Oxygen Therapy: Patient Spontanous Breathing  Post-op Pain: mild  Post-op Assessment: Post-op Vital signs reviewed, Patient's Cardiovascular Status Stable, Respiratory Function Stable, Patent Airway and No signs of Nausea or vomiting  Last Vitals:  Filed Vitals:   08/14/12 1132  BP: 153/72  Pulse: 53  Temp: 36.4 C  Resp: 20    Post-op Vital Signs: stable   Complications: No apparent anesthesia complications

## 2012-08-15 ENCOUNTER — Encounter (HOSPITAL_COMMUNITY): Payer: Self-pay

## 2012-08-15 LAB — BASIC METABOLIC PANEL
Chloride: 101 mEq/L (ref 96–112)
GFR calc Af Amer: >90 mL/min (ref >90)
GFR calc non Af Amer: 80 mL/min — ABNORMAL LOW (ref >90)
Potassium: 3.6 mEq/L (ref 3.5–5.1)
Sodium: 138 mEq/L (ref 135–145)

## 2012-08-15 LAB — GLUCOSE, CAPILLARY
Glucose-Capillary: 135 mg/dL — ABNORMAL HIGH (ref 70–99)
Glucose-Capillary: 170 mg/dL — ABNORMAL HIGH (ref 70–99)

## 2012-08-15 NOTE — Discharge Summary (Signed)
Date of admission: 08/14/2012  Date of discharge: 08/15/2012  Admission diagnosis: Urinary retention  Discharge diagnosis: urinary retention  Secondary diagnoses: Diabetes, CHF  History and Physical: For full details, please see admission history and physical. Briefly, Justin [name] is a 72 y.o. year old patient with urinary retention secondary to BPH.   Hospital Course: He underwent cystoscopy and TUVP on 06/13/13.  He remained stable throughout his hospitalization and underwent a voiding trial on POD #1 which he passed.  He was able to be discharged home.  Laboratory values: No results found for this basename: HGB, HCT,  in the last 72 hours  Recent Labs  08/15/12 0500  CREATININE 0.98    Disposition: Home  Discharge instruction: The patient was instructed to be ambulatory but told to refrain from heavy lifting, strenuous activity, or driving.  Discharge medications:    Medication List    STOP taking these medications       Cinnamon 500 MG Tabs     doxycycline 100 MG DR capsule  Commonly known as:  DORYX     oxybutynin 5 MG tablet  Commonly known as:  DITROPAN     Tamsulosin HCl 0.4 MG Caps  Commonly known as:  FLOMAX     vitamin B-12 1000 MCG tablet  Commonly known as:  CYANOCOBALAMIN     warfarin 5 MG tablet  Commonly known as:  COUMADIN      TAKE these medications       ciprofloxacin 500 MG tablet  Commonly known as:  CIPRO  Take 1 tablet (500 mg total) by mouth 2 (two) times daily.     digoxin 0.25 MG tablet  Commonly known as:  LANOXIN  Take 0.25 mg by mouth daily with breakfast.     diltiazem 240 MG 24 hr capsule  Commonly known as:  DILACOR XR  Take 240 mg by mouth daily.     docusate sodium 100 MG capsule  Commonly known as:  COLACE  Take 1 capsule (100 mg total) by mouth 2 (two) times daily.     furosemide 40 MG tablet  Commonly known as:  LASIX  Take 40 mg by mouth every morning.     gemfibrozil 600 MG tablet  Commonly known as:   LOPID  Take 600 mg by mouth 2 (two) times daily before a meal.     glipiZIDE 10 MG tablet  Commonly known as:  GLUCOTROL  Take 10 mg by mouth daily with breakfast.     HYDROcodone-acetaminophen 5-325 MG per tablet  Commonly known as:  NORCO/VICODIN  Take 1-2 tablets by mouth every 6 (six) hours as needed for pain.     HYDROcodone-acetaminophen 5-325 MG per tablet  Commonly known as:  NORCO/VICODIN  Take 1 tablet by mouth every 6 (six) hours as needed. For pain     lisinopril 2.5 MG tablet  Commonly known as:  PRINIVIL,ZESTRIL  Take 2.5 mg by mouth daily with breakfast.     metFORMIN 1000 MG tablet  Commonly known as:  GLUCOPHAGE  Take 1,000 mg by mouth 2 (two) times daily.     metoprolol 50 MG tablet  Commonly known as:  LOPRESSOR  Take 50 mg by mouth 2 (two) times daily.     neomycin-bacitracin-polymyxin [phone] ointment  Apply 1 application topically 3 (three) times daily as needed. For sores     potassium chloride SA 20 MEQ tablet  Commonly known as:  K-DUR,KLOR-CON  Take 1 tablet (20 mEq total) by mouth  daily.     PRESCRIPTION MEDICATION  Take 15 mLs by mouth every 6 (six) hours as needed. For cough. Apothecary Cough Syrup with Honey.  Hydrocodone 5mg /36ml  Chlorpheniramine 4mg /47ml  Phenylephrine 10mg /79ml     PROAIR HFA 108 (90 BASE) MCG/ACT inhaler  Generic drug:  albuterol  Inhale 2 puffs into the lungs every 6 (six) hours as needed. For shortness of breath        Followup:      Follow-up Information   Follow up with Floretta Petro,LES, MD. (09/13/12 at 12:30)    Contact information:   [address] [place] [address] [phone]

## 2012-08-15 NOTE — Progress Notes (Signed)
Patient discharged to home, wife at bedside. D/c instructions and follow up appointment done/ reinforced  and given to patient and wife, verbalized understanding. PIV removed no s/s of swelling or infiltration noted. No complaints of itching in sacral and scrotal area, but still pinkish.Urine still pinkish. No complaints of pain or discomfort upon discharge.

## 2012-08-15 NOTE — Progress Notes (Signed)
Patient complained of itching in scrotal and sacral area, redness noted. PRN meds given.

## 2012-08-15 NOTE — Progress Notes (Signed)
Patient ID: Justin [name], male   DOB: Nov 10, 1940, 72 y.o.   MRN: [id number]  1 Day Post-Op Subjective: No complaints overnight. No CP or SOB.  Objective: Vital signs in last 24 hours: Temp:  [97.2 F (36.2 C)-98 F (36.7 C)] 98 F (36.7 C) (02/18 0609) Pulse Rate:  [49-78] 77 (02/18 0609) Resp:  [11-20] 19 (02/18 0609) BP: (120-156)/(64-85) 135/66 mmHg (02/18 0609) SpO2:  [94 %-100 %] 98 % (02/18 0609) Weight:  [90.266 kg (199 lb)] 90.266 kg (199 lb) (02/17 2128)  Intake/Output from previous day: 02/17 0701 - 02/18 0700 In: 7351.3 [I.V.:1101.3; IV Piggyback:250] Out: 7300 [Urine:7300] Intake/Output this shift:    Physical Exam:  General: Alert and oriented CV: Irregular Lungs: Clear Abdomen: Soft, ND GU: Urine light pink with CBI off Ext: NT, No erythema  Lab Results: No results found for this basename: HGB, HCT,  in the last 72 hours BMET  Recent Labs  08/15/12 0500  NA 138  K 3.6  CL 101  CO2 29  GLUCOSE 192*  BUN 23  CREATININE 0.98  CALCIUM 8.9     Studies/Results: No results found.  Assessment/Plan: - Voiding trial this AM. Will d/c home later today.    LOS: 1 day   Marcie Shearon,LES 08/15/2012, 7:13 AM

## 2012-08-17 LAB — URINE CULTURE

## 2012-09-01 ENCOUNTER — Ambulatory Visit: Payer: Self-pay | Admitting: Cardiovascular Disease

## 2012-09-01 DIAGNOSIS — I4821 Permanent atrial fibrillation: Secondary | ICD-10-CM

## 2012-09-01 DIAGNOSIS — Z7901 Long term (current) use of anticoagulants: Secondary | ICD-10-CM

## 2012-10-04 LAB — PROTIME-INR

## 2012-11-08 ENCOUNTER — Ambulatory Visit (HOSPITAL_COMMUNITY)
Admission: RE | Admit: 2012-11-08 | Discharge: 2012-11-08 | Disposition: A | Discharge status: Home / Self Care | Payer: Medicare Other | Source: Ambulatory Visit | Attending: Family Medicine | Admitting: Family Medicine

## 2012-11-08 ENCOUNTER — Other Ambulatory Visit (HOSPITAL_COMMUNITY): Payer: Self-pay | Admitting: Family Medicine

## 2012-11-08 DIAGNOSIS — R609 Edema, unspecified: Secondary | ICD-10-CM

## 2012-11-08 DIAGNOSIS — R52 Pain, unspecified: Secondary | ICD-10-CM

## 2012-11-08 DIAGNOSIS — M25569 Pain in unspecified knee: Secondary | ICD-10-CM | POA: Insufficient documentation

## 2012-11-10 ENCOUNTER — Other Ambulatory Visit: Payer: Self-pay | Admitting: *Deleted

## 2012-11-10 MED ORDER — LISINOPRIL 2.5 MG PO TABS: 2.5000 mg | ORAL_TABLET | Freq: Every day | ORAL | Status: DC

## 2012-11-10 MED ORDER — METOPROLOL TARTRATE 50 MG PO TABS: 50.0000 mg | ORAL_TABLET | Freq: Two times a day (BID) | ORAL | Status: DC

## 2012-11-10 MED ORDER — DIGOXIN 250 MCG PO TABS: 0.2500 mg | ORAL_TABLET | Freq: Every day | ORAL | Status: DC

## 2013-01-18 ENCOUNTER — Telehealth: Payer: Self-pay | Admitting: Pharmacist Clinician (PhC)/ Clinical Pharmacy Specialist

## 2013-01-18 DIAGNOSIS — I4891 Unspecified atrial fibrillation: Secondary | ICD-10-CM

## 2013-01-18 DIAGNOSIS — Z7901 Long term (current) use of anticoagulants: Secondary | ICD-10-CM

## 2013-01-18 NOTE — Telephone Encounter (Signed)
Justin [name] states that she needs a standing order for [name] to have his PT checked at the lab across the street from [facility].

## 2013-02-14 ENCOUNTER — Ambulatory Visit (INDEPENDENT_AMBULATORY_CARE_PROVIDER_SITE_OTHER): Payer: Medicare Other | Admitting: Cardiovascular Disease

## 2013-02-14 ENCOUNTER — Ambulatory Visit (INDEPENDENT_AMBULATORY_CARE_PROVIDER_SITE_OTHER): Payer: Medicare Other | Admitting: Pharmacist Clinician (PhC)/ Clinical Pharmacy Specialist

## 2013-02-14 ENCOUNTER — Encounter: Payer: Self-pay | Admitting: Cardiovascular Disease

## 2013-02-14 VITALS — BP 146/71 | HR 55 | Ht 71.0 in | Wt 195.3 lb

## 2013-02-14 DIAGNOSIS — I428 Other cardiomyopathies: Secondary | ICD-10-CM

## 2013-02-14 DIAGNOSIS — I4891 Unspecified atrial fibrillation: Secondary | ICD-10-CM

## 2013-02-14 DIAGNOSIS — I43 Cardiomyopathy in diseases classified elsewhere: Secondary | ICD-10-CM

## 2013-02-14 DIAGNOSIS — Z7901 Long term (current) use of anticoagulants: Secondary | ICD-10-CM

## 2013-02-14 DIAGNOSIS — R000 Tachycardia, unspecified: Secondary | ICD-10-CM

## 2013-02-14 DIAGNOSIS — I4821 Permanent atrial fibrillation: Secondary | ICD-10-CM

## 2013-02-14 NOTE — Patient Instructions (Addendum)
Stop Digoxin.  Your physician recommends that you schedule a follow-up appointment in: 6months.  Have bloodwork for Coumadin level as directed at the [facility] in [place].
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
## 2013-02-27 DIAGNOSIS — I43 Cardiomyopathy in diseases classified elsewhere: Secondary | ICD-10-CM | POA: Insufficient documentation

## 2013-02-27 NOTE — Assessment & Plan Note (Addendum)
Had acute heart failure and severely depressed EF of 25-30% last November. This occurred when he inadvertently stopped his rate control meds; coronary angiography showed normal arteries. Followup echo showed partial improvement in LVEF. Has improved clinically further since that time. Suspect he may now have normal EF and would like to confirm this by followup echo.

## 2013-02-27 NOTE — Assessment & Plan Note (Addendum)
Rate control is good and he is appropriately anticoagulated. This arrhythmia has been responsible for the episode of heart failure, severe tachycardia-induced cardiomyopathy with significant clinical improvement after better control of ventricular rate. He has difficult to control ventricular rates and required combination therapy with calcium channel blockers, metoprolol and digoxin. His heart rate is now slow: recommend discontinuing the digoxin.

## 2013-03-07 ENCOUNTER — Other Ambulatory Visit: Payer: Self-pay | Admitting: Pharmacist Clinician (PhC)/ Clinical Pharmacy Specialist

## 2013-03-07 ENCOUNTER — Telehealth: Payer: Self-pay | Admitting: Cardiovascular Disease

## 2013-03-07 LAB — PROTIME-INR: INR: 1.86 — ABNORMAL HIGH (ref <1.50)

## 2013-03-07 NOTE — Telephone Encounter (Signed)
Justin [name], CMA already spoke w/ other co-worker while Justin [name] was calling.

## 2013-03-08 ENCOUNTER — Ambulatory Visit (INDEPENDENT_AMBULATORY_CARE_PROVIDER_SITE_OTHER): Payer: Medicare Other | Admitting: Pharmacist Clinician (PhC)/ Clinical Pharmacy Specialist

## 2013-03-08 DIAGNOSIS — I4891 Unspecified atrial fibrillation: Secondary | ICD-10-CM

## 2013-03-08 DIAGNOSIS — Z7901 Long term (current) use of anticoagulants: Secondary | ICD-10-CM

## 2013-03-08 DIAGNOSIS — I4821 Permanent atrial fibrillation: Secondary | ICD-10-CM

## 2013-03-26 ENCOUNTER — Other Ambulatory Visit: Payer: Self-pay | Admitting: Pharmacist Clinician (PhC)/ Clinical Pharmacy Specialist

## 2013-03-27 ENCOUNTER — Ambulatory Visit (INDEPENDENT_AMBULATORY_CARE_PROVIDER_SITE_OTHER): Payer: Medicare Other | Admitting: Pharmacist Clinician (PhC)/ Clinical Pharmacy Specialist

## 2013-03-27 ENCOUNTER — Telehealth: Payer: Self-pay | Admitting: Pharmacist Clinician (PhC)/ Clinical Pharmacy Specialist

## 2013-03-27 DIAGNOSIS — Z7901 Long term (current) use of anticoagulants: Secondary | ICD-10-CM

## 2013-03-27 DIAGNOSIS — I4821 Permanent atrial fibrillation: Secondary | ICD-10-CM

## 2013-03-27 DIAGNOSIS — I4891 Unspecified atrial fibrillation: Secondary | ICD-10-CM

## 2013-03-27 NOTE — Telephone Encounter (Signed)
Wanted to double check next INR date.

## 2013-03-27 NOTE — Telephone Encounter (Signed)
Please call.

## 2013-03-28 ENCOUNTER — Encounter: Payer: Self-pay | Admitting: Cardiovascular Disease

## 2013-04-03 ENCOUNTER — Telehealth: Payer: Self-pay | Admitting: Cardiovascular Disease

## 2013-04-03 NOTE — Telephone Encounter (Signed)
Reviewed med list with wife - updated today in Epic

## 2013-04-03 NOTE — Telephone Encounter (Signed)
Needs to talk to someone about his medications....especially his warfarin.

## 2013-04-04 ENCOUNTER — Other Ambulatory Visit: Payer: Self-pay | Admitting: Cardiovascular Disease

## 2013-04-16 ENCOUNTER — Other Ambulatory Visit: Payer: Self-pay | Admitting: Pharmacist Clinician (PhC)/ Clinical Pharmacy Specialist

## 2013-04-17 ENCOUNTER — Ambulatory Visit (INDEPENDENT_AMBULATORY_CARE_PROVIDER_SITE_OTHER): Payer: Medicare Other | Admitting: Pharmacist Clinician (PhC)/ Clinical Pharmacy Specialist

## 2013-04-17 DIAGNOSIS — I4821 Permanent atrial fibrillation: Secondary | ICD-10-CM

## 2013-04-17 DIAGNOSIS — Z7901 Long term (current) use of anticoagulants: Secondary | ICD-10-CM

## 2013-04-17 DIAGNOSIS — I4891 Unspecified atrial fibrillation: Secondary | ICD-10-CM

## 2013-04-17 LAB — PROTIME-INR: Prothrombin Time: 17.3 seconds — ABNORMAL HIGH (ref 11.6–15.2)

## 2013-04-30 ENCOUNTER — Other Ambulatory Visit: Payer: Self-pay | Admitting: Pharmacist Clinician (PhC)/ Clinical Pharmacy Specialist

## 2013-05-01 ENCOUNTER — Ambulatory Visit (INDEPENDENT_AMBULATORY_CARE_PROVIDER_SITE_OTHER): Payer: Medicare Other | Admitting: Pharmacist Clinician (PhC)/ Clinical Pharmacy Specialist

## 2013-05-01 DIAGNOSIS — Z7901 Long term (current) use of anticoagulants: Secondary | ICD-10-CM

## 2013-05-01 DIAGNOSIS — I4891 Unspecified atrial fibrillation: Secondary | ICD-10-CM

## 2013-05-01 DIAGNOSIS — I4821 Permanent atrial fibrillation: Secondary | ICD-10-CM

## 2013-05-01 LAB — PROTIME-INR: INR: 1.98 — ABNORMAL HIGH (ref <1.50)

## 2013-05-29 ENCOUNTER — Telehealth: Payer: Self-pay | Admitting: Pharmacist Clinician (PhC)/ Clinical Pharmacy Specialist

## 2013-05-29 ENCOUNTER — Ambulatory Visit (INDEPENDENT_AMBULATORY_CARE_PROVIDER_SITE_OTHER): Payer: Medicare Other | Admitting: Pharmacist Clinician (PhC)/ Clinical Pharmacy Specialist

## 2013-05-29 ENCOUNTER — Other Ambulatory Visit: Payer: Self-pay | Admitting: Pharmacist Clinician (PhC)/ Clinical Pharmacy Specialist

## 2013-05-29 DIAGNOSIS — I4821 Permanent atrial fibrillation: Secondary | ICD-10-CM

## 2013-05-29 DIAGNOSIS — Z7901 Long term (current) use of anticoagulants: Secondary | ICD-10-CM

## 2013-05-29 DIAGNOSIS — I4891 Unspecified atrial fibrillation: Secondary | ICD-10-CM

## 2013-05-30 NOTE — Telephone Encounter (Signed)
Prescription at [facility], unable to fill, need NPI #

## 2013-06-12 ENCOUNTER — Other Ambulatory Visit: Payer: Self-pay | Admitting: Pharmacist Clinician (PhC)/ Clinical Pharmacy Specialist

## 2013-06-13 ENCOUNTER — Other Ambulatory Visit: Payer: Self-pay | Admitting: Cardiovascular Disease

## 2013-06-13 ENCOUNTER — Ambulatory Visit (INDEPENDENT_AMBULATORY_CARE_PROVIDER_SITE_OTHER): Payer: Medicare Other | Admitting: Pharmacist Clinician (PhC)/ Clinical Pharmacy Specialist

## 2013-06-13 DIAGNOSIS — Z7901 Long term (current) use of anticoagulants: Secondary | ICD-10-CM

## 2013-06-13 DIAGNOSIS — I4891 Unspecified atrial fibrillation: Secondary | ICD-10-CM

## 2013-06-13 DIAGNOSIS — I4821 Permanent atrial fibrillation: Secondary | ICD-10-CM

## 2013-06-13 LAB — PROTIME-INR: Prothrombin Time: 24.5 seconds — ABNORMAL HIGH (ref 11.6–15.2)

## 2013-06-13 NOTE — Telephone Encounter (Signed)
Rx was sent to pharmacy electronically. 

## 2013-06-18 ENCOUNTER — Encounter (HOSPITAL_COMMUNITY): Payer: Self-pay | Admitting: Emergency Medicine

## 2013-06-18 ENCOUNTER — Emergency Department (HOSPITAL_COMMUNITY)
Admission: EM | Admit: 2013-06-18 | Discharge: 2013-06-18 | Disposition: A | Discharge status: Home / Self Care | Payer: Medicare Other | Attending: Emergency Medicine | Admitting: Emergency Medicine

## 2013-06-18 ENCOUNTER — Emergency Department (HOSPITAL_COMMUNITY): Payer: Medicare Other

## 2013-06-18 DIAGNOSIS — Q211 Atrial septal defect: Secondary | ICD-10-CM | POA: Insufficient documentation

## 2013-06-18 DIAGNOSIS — Q2111 Secundum atrial septal defect: Secondary | ICD-10-CM | POA: Insufficient documentation

## 2013-06-18 DIAGNOSIS — I10 Essential (primary) hypertension: Secondary | ICD-10-CM | POA: Insufficient documentation

## 2013-06-18 DIAGNOSIS — I4891 Unspecified atrial fibrillation: Secondary | ICD-10-CM | POA: Insufficient documentation

## 2013-06-18 DIAGNOSIS — Z7901 Long term (current) use of anticoagulants: Secondary | ICD-10-CM | POA: Insufficient documentation

## 2013-06-18 DIAGNOSIS — Y929 Unspecified place or not applicable: Secondary | ICD-10-CM | POA: Insufficient documentation

## 2013-06-18 DIAGNOSIS — Z872 Personal history of diseases of the skin and subcutaneous tissue: Secondary | ICD-10-CM | POA: Insufficient documentation

## 2013-06-18 DIAGNOSIS — Y939 Activity, unspecified: Secondary | ICD-10-CM | POA: Insufficient documentation

## 2013-06-18 DIAGNOSIS — Z8669 Personal history of other diseases of the nervous system and sense organs: Secondary | ICD-10-CM | POA: Insufficient documentation

## 2013-06-18 DIAGNOSIS — I509 Heart failure, unspecified: Secondary | ICD-10-CM | POA: Insufficient documentation

## 2013-06-18 DIAGNOSIS — E119 Type 2 diabetes mellitus without complications: Secondary | ICD-10-CM | POA: Insufficient documentation

## 2013-06-18 DIAGNOSIS — S43429A Sprain of unspecified rotator cuff capsule, initial encounter: Secondary | ICD-10-CM | POA: Insufficient documentation

## 2013-06-18 DIAGNOSIS — Z862 Personal history of diseases of the blood and blood-forming organs and certain disorders involving the immune mechanism: Secondary | ICD-10-CM | POA: Insufficient documentation

## 2013-06-18 DIAGNOSIS — S46011A Strain of muscle(s) and tendon(s) of the rotator cuff of right shoulder, initial encounter: Secondary | ICD-10-CM

## 2013-06-18 DIAGNOSIS — Z79899 Other long term (current) drug therapy: Secondary | ICD-10-CM | POA: Insufficient documentation

## 2013-06-18 DIAGNOSIS — I498 Other specified cardiac arrhythmias: Secondary | ICD-10-CM | POA: Insufficient documentation

## 2013-06-18 DIAGNOSIS — X58XXXA Exposure to other specified factors, initial encounter: Secondary | ICD-10-CM | POA: Insufficient documentation

## 2013-06-18 DIAGNOSIS — E785 Hyperlipidemia, unspecified: Secondary | ICD-10-CM | POA: Insufficient documentation

## 2013-06-18 MED ORDER — TRAMADOL-ACETAMINOPHEN 37.5-325 MG PO TABS: 1.0000 | ORAL_TABLET | Freq: Four times a day (QID) | ORAL | Status: DC | PRN

## 2013-06-18 MED ORDER — TRAMADOL HCL 50 MG PO TABS
50.0000 mg | ORAL_TABLET | Freq: Once | ORAL | Status: AC
  Administered 2013-06-18: 50 mg via ORAL
  Filled 2013-06-18: qty 1

## 2013-06-18 NOTE — ED Notes (Signed)
Patient instructed to wear sling for comfort to right shoulder. Instructed on use and care of sling.

## 2013-06-18 NOTE — ED Notes (Signed)
Pt reports hurt r shoulder raking leaves last Tuesday.  Pt says shoulder has not gotten any better.  Strong radial pulse palpable in r wrist.
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
## 2013-06-18 NOTE — ED Notes (Signed)
This RN is now caring for this patient at this time. Patient placed on continuous cardiac monitoring with continuous pulse ox. Patients heart rate noted to be in the 50s. Dr Effie Shy is aware. Patient denies any chest pain or shortness of breath and all other vital signs are stable. Will continue to monitor patient.

## 2013-06-18 NOTE — ED Notes (Signed)
Patient with no complaints at this time. Respirations even and unlabored. Skin warm/dry. Discharge instructions reviewed with patient at this time. Patient given opportunity to voice concerns/ask questions. Patient discharged at this time and left Emergency Department with steady gait.   

## 2013-06-18 NOTE — ED Notes (Signed)
Pt's HR irregular in triage and decreased to 37.  Pt asymptomatic.

## 2013-06-25 ENCOUNTER — Telehealth: Payer: Self-pay | Admitting: Cardiovascular Disease

## 2013-06-25 NOTE — Telephone Encounter (Signed)
Pulled muscle recently and went to [facility] ER  Was told to check with his Dr because it seems he is on 2 BP meds.  She wants to verify what he should be taking.  Dr [name] is family Dr and he has prescribed a BP med. Please call

## 2013-06-25 NOTE — Telephone Encounter (Signed)
Returned call and pt verified x 2.  Pt put wife, [name], on phone.  Stated when pt was seen in ER at [facility], they couldn't get his HR up to where it was supposed to be.  Sated they recommended he contact his cardiologist.  Wife reviewed meds and pt is still taking digoxin.  Informed wife that per last OV note in August 2014, that pt was supposed to STOP taking digoxin.  Wife stated pt has still been taking it.  Nada Boozer, NP notified and advised pt STOP digoxin now and see Dr. Salena Saner in 2 weeks.   Wife informed and agreed.  Wrote down instructions and appt scheduled for 1.13.15 at 2:15pm w/ Dr. Salena Saner.  Wife asked pt to check BP now and pt upset b/c he isn't able to do for himself like usual.  Pt checked BP and HR: 163/81, 67.  Wife informed BP is elevated, but may be r/t pt being upset and being in pain.  Informed RN will call back if further instructions given, but otherwise they are to call back if pt develops any symptoms or has any concerns before appt.  Wife verbalized understanding and agreed w/ plan.  Message forwarded to Dr. Royann Shivers.

## 2013-07-10 ENCOUNTER — Telehealth: Payer: Self-pay | Admitting: Cardiovascular Disease

## 2013-07-10 ENCOUNTER — Ambulatory Visit: Payer: Medicare Other | Admitting: Cardiovascular Disease

## 2013-07-10 NOTE — Telephone Encounter (Signed)
Wife unsure of when INR due.  Needs INR this week, due on 1/14.  Having trouble with new insurance, will go once they figure it out.

## 2013-07-10 NOTE — Telephone Encounter (Signed)
Would like to speak to you about his warfrain .Marland KitchenMarland Kitchenplease call

## 2013-07-18 ENCOUNTER — Other Ambulatory Visit: Payer: Self-pay | Admitting: Pharmacist Clinician (PhC)/ Clinical Pharmacy Specialist

## 2013-07-18 LAB — PROTIME-INR
INR: 3.33 — ABNORMAL HIGH (ref <1.50)
PROTHROMBIN TIME: 32.8 s — AB (ref 11.6–15.2)

## 2013-07-19 ENCOUNTER — Ambulatory Visit (INDEPENDENT_AMBULATORY_CARE_PROVIDER_SITE_OTHER): Payer: Medicare Other | Admitting: Pharmacist Clinician (PhC)/ Clinical Pharmacy Specialist

## 2013-07-19 DIAGNOSIS — Z7901 Long term (current) use of anticoagulants: Secondary | ICD-10-CM

## 2013-07-19 DIAGNOSIS — I4891 Unspecified atrial fibrillation: Secondary | ICD-10-CM

## 2013-07-19 DIAGNOSIS — I4821 Permanent atrial fibrillation: Secondary | ICD-10-CM

## 2013-07-24 ENCOUNTER — Encounter: Payer: Self-pay | Admitting: *Deleted

## 2013-07-31 ENCOUNTER — Ambulatory Visit (INDEPENDENT_AMBULATORY_CARE_PROVIDER_SITE_OTHER): Payer: Medicare HMO | Admitting: Cardiovascular Disease

## 2013-07-31 ENCOUNTER — Encounter: Payer: Self-pay | Admitting: Cardiovascular Disease

## 2013-07-31 VITALS — BP 132/62 | HR 64 | Resp 16 | Ht 67.0 in | Wt 204.4 lb

## 2013-07-31 DIAGNOSIS — I4891 Unspecified atrial fibrillation: Secondary | ICD-10-CM

## 2013-07-31 DIAGNOSIS — E119 Type 2 diabetes mellitus without complications: Secondary | ICD-10-CM

## 2013-07-31 DIAGNOSIS — I4821 Permanent atrial fibrillation: Secondary | ICD-10-CM

## 2013-07-31 DIAGNOSIS — I43 Cardiomyopathy in diseases classified elsewhere: Secondary | ICD-10-CM

## 2013-07-31 DIAGNOSIS — R000 Tachycardia, unspecified: Secondary | ICD-10-CM

## 2013-07-31 NOTE — Patient Instructions (Signed)
Your physician recommends that you schedule a follow-up appointment in: ONE YEAR 

## 2013-08-01 NOTE — Assessment & Plan Note (Signed)
I suspect that if we were to look now we would find that his ejection fraction has normalized completely.
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
## 2013-08-01 NOTE — Assessment & Plan Note (Signed)
Excellent rate control and compliance with warfarin anticoagulation with therapeutic levels. Rate control has been challenging in the past and for this reason he takes 2 different medications, metoprolol and diltiazem. He also required digoxin during his episode of particular exacerbation but this is no longer necessary.

## 2013-08-01 NOTE — Assessment & Plan Note (Signed)
We'll request his most recent hemoglobin A1c and lipid profile from his primary care physician, Dr. Karie Kirks

## 2013-08-13 ENCOUNTER — Ambulatory Visit (HOSPITAL_COMMUNITY)
Admission: RE | Admit: 2013-08-13 | Discharge: 2013-08-13 | Disposition: A | Discharge status: Home / Self Care | Payer: Medicare HMO | Source: Ambulatory Visit | Attending: Family Medicine | Admitting: Family Medicine

## 2013-08-13 DIAGNOSIS — M25519 Pain in unspecified shoulder: Secondary | ICD-10-CM | POA: Insufficient documentation

## 2013-08-13 DIAGNOSIS — IMO0001 Reserved for inherently not codable concepts without codable children: Secondary | ICD-10-CM | POA: Insufficient documentation

## 2013-08-13 DIAGNOSIS — E119 Type 2 diabetes mellitus without complications: Secondary | ICD-10-CM | POA: Insufficient documentation

## 2013-08-13 DIAGNOSIS — I10 Essential (primary) hypertension: Secondary | ICD-10-CM | POA: Insufficient documentation

## 2013-08-13 DIAGNOSIS — M6281 Muscle weakness (generalized): Secondary | ICD-10-CM | POA: Insufficient documentation
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
## 2013-08-22 ENCOUNTER — Other Ambulatory Visit: Payer: Self-pay | Admitting: Cardiovascular Disease

## 2013-08-22 ENCOUNTER — Telehealth: Payer: Self-pay | Admitting: Pharmacist Clinician (PhC)/ Clinical Pharmacy Specialist

## 2013-08-22 NOTE — Telephone Encounter (Signed)
Wife called, unsure of when next INR due.

## 2013-08-22 NOTE — Telephone Encounter (Signed)
Rx was sent to pharmacy electronically. 

## 2013-08-24 ENCOUNTER — Other Ambulatory Visit: Payer: Self-pay | Admitting: Pharmacist Clinician (PhC)/ Clinical Pharmacy Specialist

## 2013-08-25 LAB — PROTIME-INR
INR: 2.76 — AB (ref <1.50)
Prothrombin Time: 28.4 seconds — ABNORMAL HIGH (ref 11.6–15.2)

## 2013-08-27 ENCOUNTER — Ambulatory Visit (INDEPENDENT_AMBULATORY_CARE_PROVIDER_SITE_OTHER): Payer: Medicare HMO | Admitting: Pharmacist Clinician (PhC)/ Clinical Pharmacy Specialist

## 2013-08-27 DIAGNOSIS — I4891 Unspecified atrial fibrillation: Secondary | ICD-10-CM

## 2013-08-27 DIAGNOSIS — I4821 Permanent atrial fibrillation: Secondary | ICD-10-CM

## 2013-08-27 DIAGNOSIS — Z7901 Long term (current) use of anticoagulants: Secondary | ICD-10-CM

## 2013-08-29 ENCOUNTER — Other Ambulatory Visit: Payer: Self-pay

## 2013-08-29 MED ORDER — METOPROLOL TARTRATE 50 MG PO TABS: 50.0000 mg | ORAL_TABLET | Freq: Two times a day (BID) | ORAL | Status: DC

## 2013-08-29 NOTE — Telephone Encounter (Signed)
Rx was sent to pharmacy electronically. 

## 2013-08-30 MED ORDER — WARFARIN SODIUM 5 MG PO TABS
ORAL_TABLET | ORAL | Status: DC
Start: ? — End: 2014-10-18

## 2013-09-11 ENCOUNTER — Telehealth: Payer: Self-pay | Admitting: *Deleted

## 2013-09-11 DIAGNOSIS — I4891 Unspecified atrial fibrillation: Secondary | ICD-10-CM

## 2013-09-11 DIAGNOSIS — Z7901 Long term (current) use of anticoagulants: Secondary | ICD-10-CM

## 2013-09-11 NOTE — Telephone Encounter (Signed)
Caren Griffins at [facility] needs an INR order.  MC/Kristen

## 2013-09-11 NOTE — Telephone Encounter (Signed)
Pt at lab for INR check.  Order released.

## 2013-09-12 LAB — PROTIME-INR
INR: 2.58 — ABNORMAL HIGH (ref <1.50)
Prothrombin Time: 27 seconds — ABNORMAL HIGH (ref 11.6–15.2)

## 2013-09-13 ENCOUNTER — Ambulatory Visit (INDEPENDENT_AMBULATORY_CARE_PROVIDER_SITE_OTHER): Payer: Medicare HMO | Admitting: Pharmacist Clinician (PhC)/ Clinical Pharmacy Specialist

## 2013-09-13 DIAGNOSIS — Z7901 Long term (current) use of anticoagulants: Secondary | ICD-10-CM

## 2013-09-13 DIAGNOSIS — I4821 Permanent atrial fibrillation: Secondary | ICD-10-CM

## 2013-09-13 DIAGNOSIS — I4891 Unspecified atrial fibrillation: Secondary | ICD-10-CM

## 2013-10-03 IMAGING — CR DG CHEST 2V
2 series · 2 of 2 positions shown · non-contrast
Comparison: Chest radiograph 05/10/2012 chest CT 05/08/2012

CLINICAL DATA: Preop

CHEST - 2 VIEW

[w chest pa]
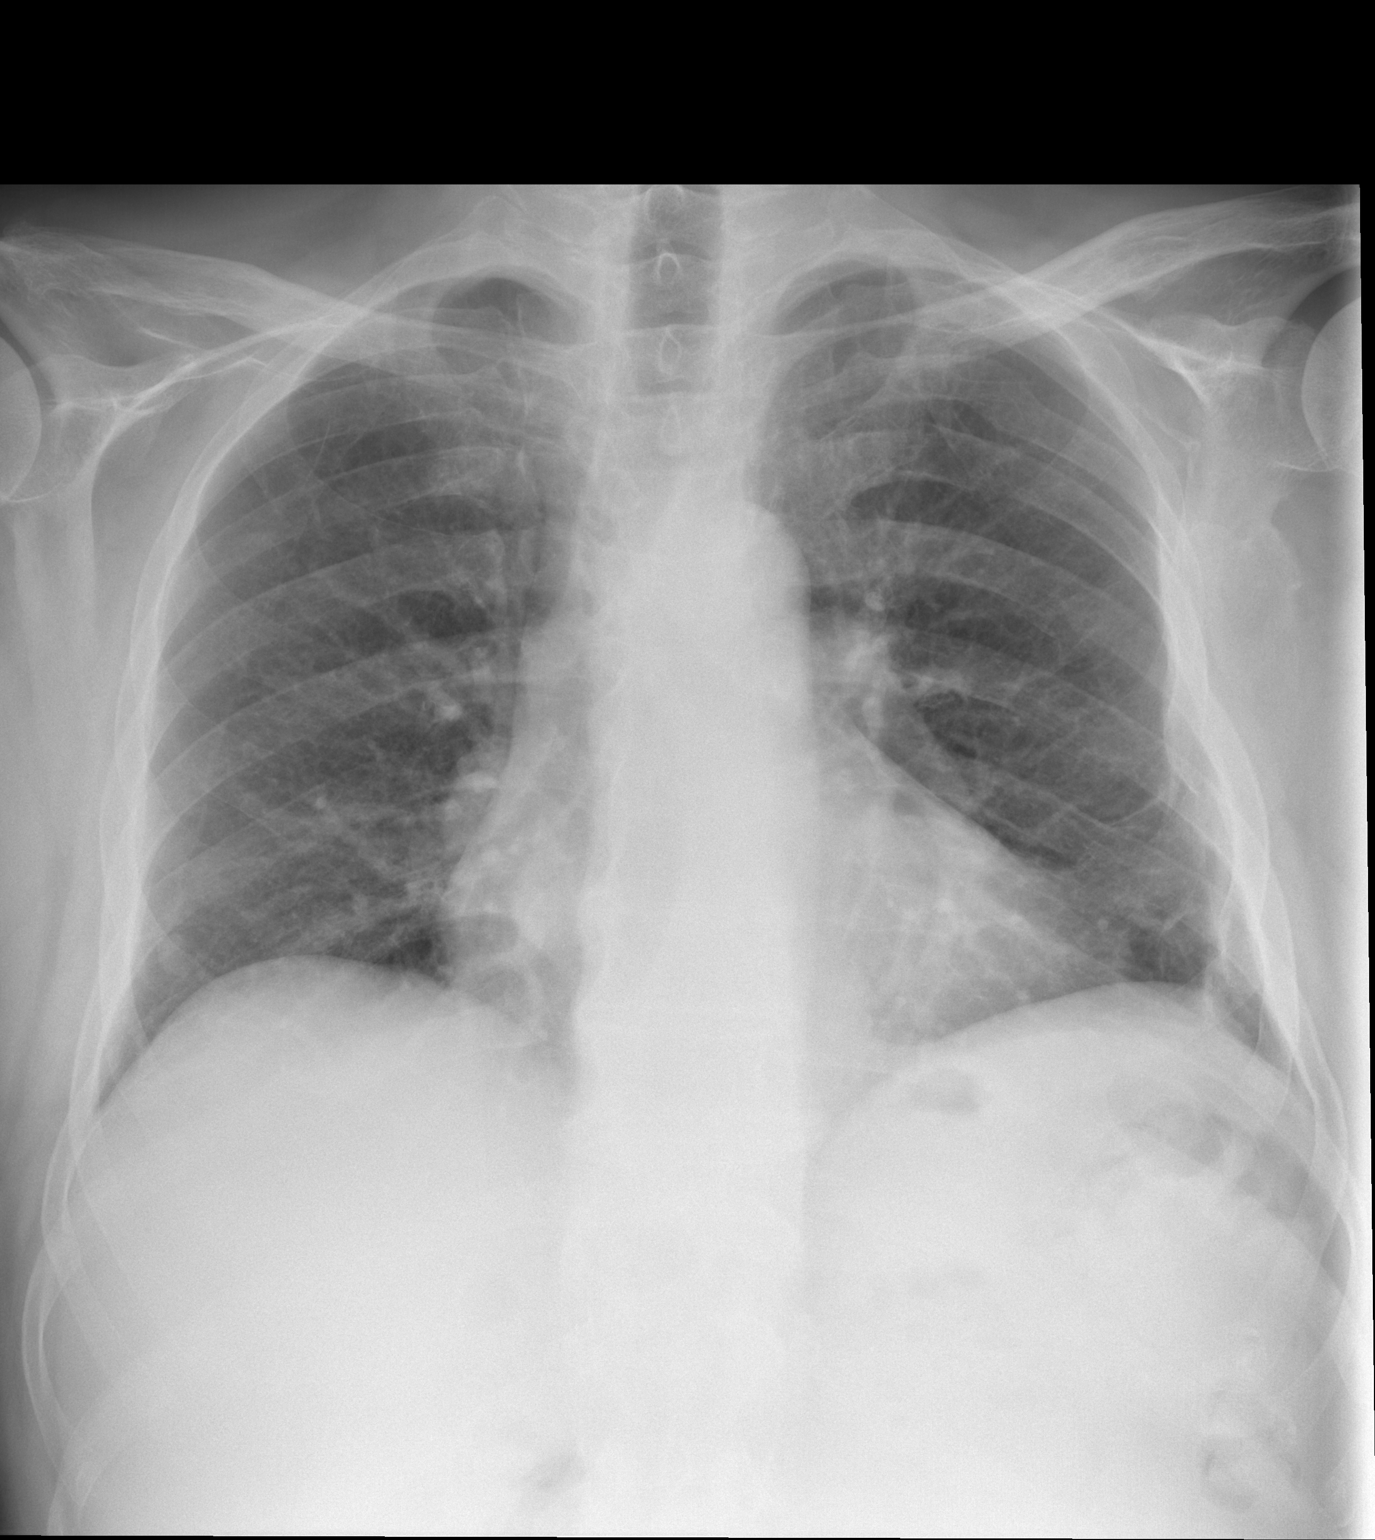

[w chest lat]
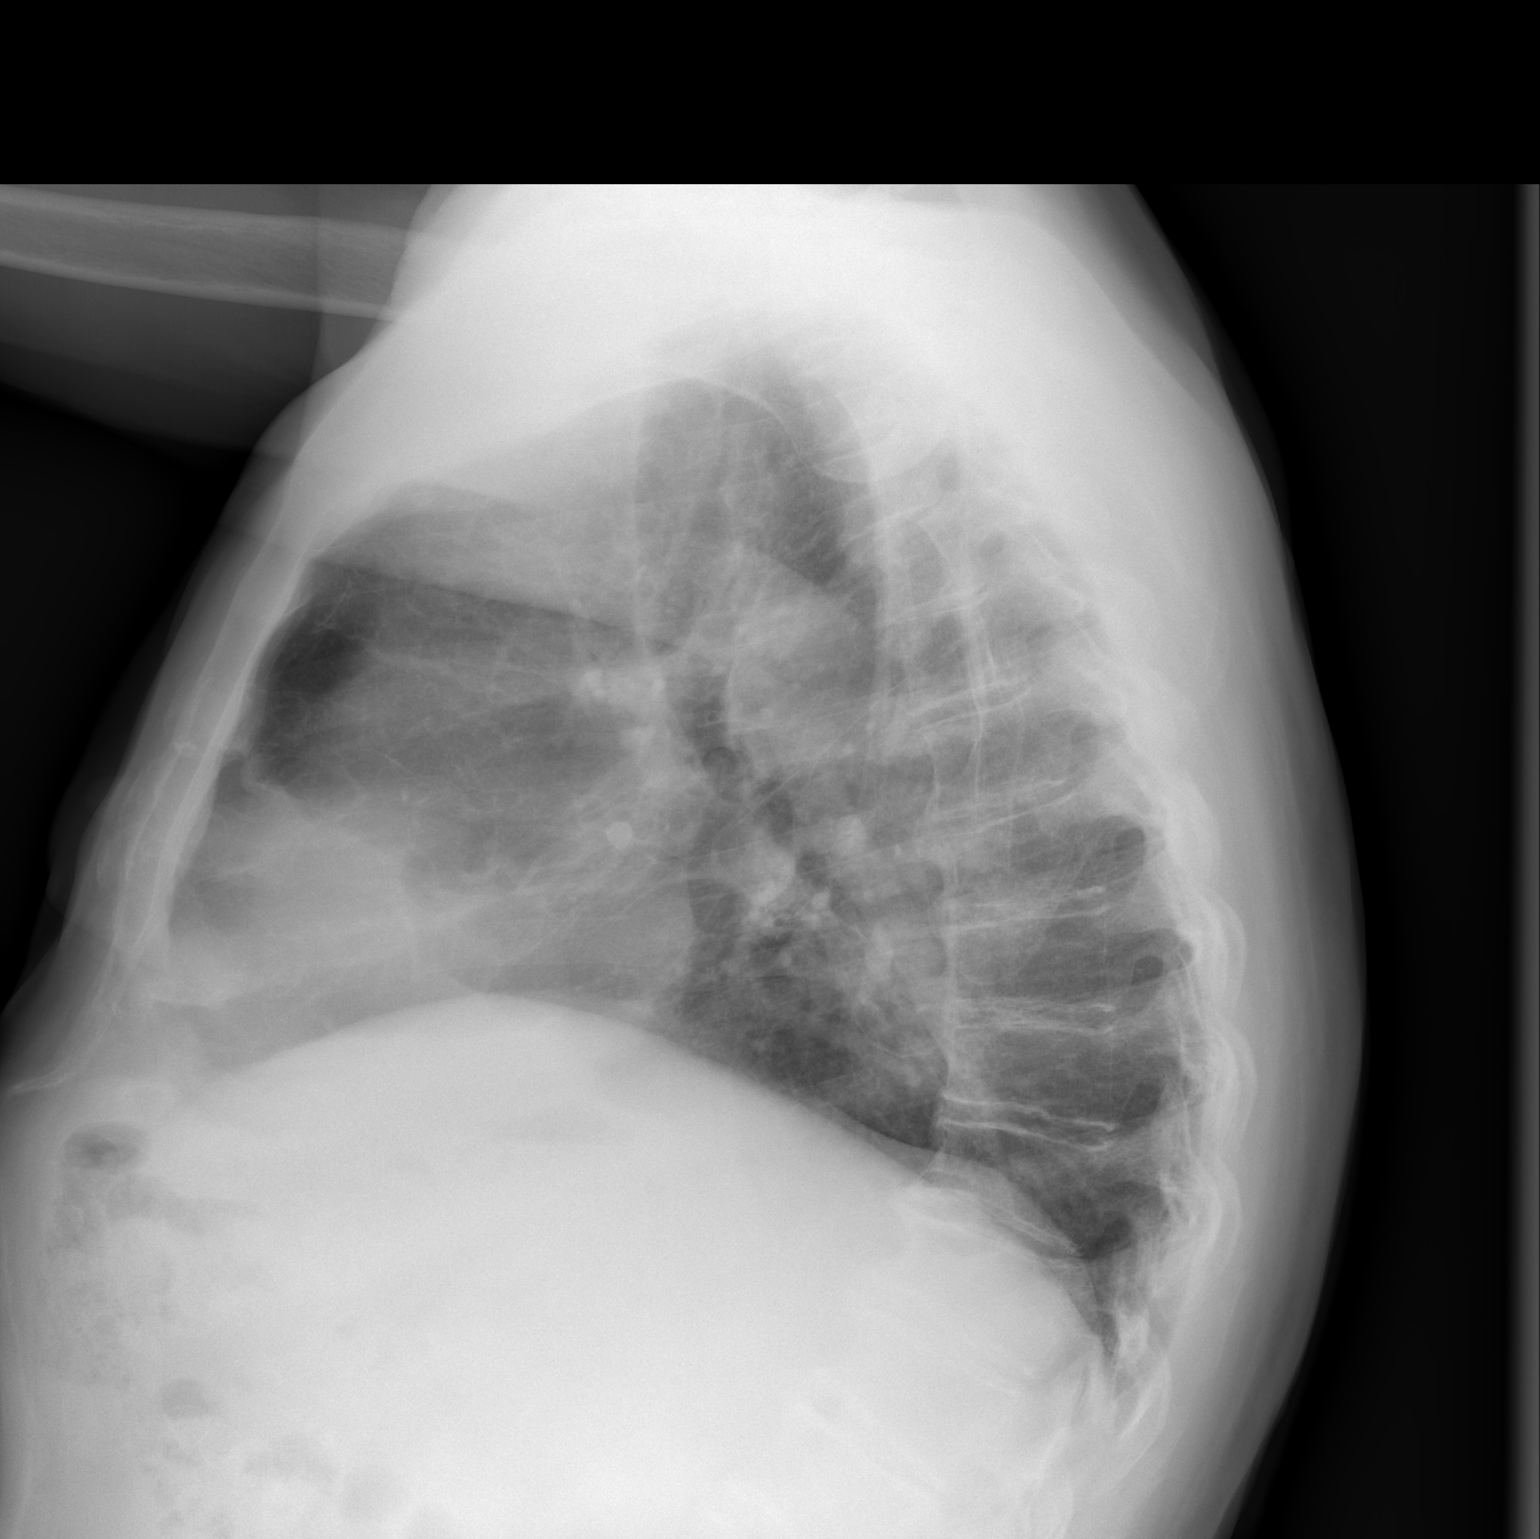

[2 of 2 positions shown; findings below may reference images not displayed]

FINDINGS: Mild cardiomegaly.  Thoracic aorta and hilar contours are
within normal limits. The lungs are clear, aside from mild linear
scarring at the left lung base. Right nipple shadow noted.
Multiple remote left-sided rib fractures, healed.
IMPRESSION: No acute cardiopulmonary disease.  Stable cardiomegaly.

## 2013-10-22 ENCOUNTER — Ambulatory Visit (HOSPITAL_COMMUNITY)
Admission: RE | Admit: 2013-10-22 | Discharge: 2013-10-22 | Disposition: A | Discharge status: Home / Self Care | Payer: Medicare HMO | Source: Ambulatory Visit | Attending: Internal Medicine | Admitting: Internal Medicine

## 2013-10-22 ENCOUNTER — Other Ambulatory Visit (HOSPITAL_COMMUNITY): Payer: Self-pay | Admitting: Internal Medicine

## 2013-10-22 DIAGNOSIS — M79641 Pain in right hand: Secondary | ICD-10-CM

## 2013-10-22 DIAGNOSIS — M7989 Other specified soft tissue disorders: Secondary | ICD-10-CM | POA: Insufficient documentation

## 2013-10-22 DIAGNOSIS — R937 Abnormal findings on diagnostic imaging of other parts of musculoskeletal system: Secondary | ICD-10-CM | POA: Insufficient documentation

## 2013-10-22 DIAGNOSIS — M79609 Pain in unspecified limb: Secondary | ICD-10-CM | POA: Insufficient documentation

## 2013-10-25 ENCOUNTER — Telehealth: Payer: Self-pay | Admitting: Cardiovascular Disease

## 2013-10-25 DIAGNOSIS — I4891 Unspecified atrial fibrillation: Secondary | ICD-10-CM

## 2013-10-25 DIAGNOSIS — Z7901 Long term (current) use of anticoagulants: Secondary | ICD-10-CM

## 2013-10-25 NOTE — Telephone Encounter (Signed)
Order needed for INR.  Order sent.

## 2013-10-26 ENCOUNTER — Ambulatory Visit (INDEPENDENT_AMBULATORY_CARE_PROVIDER_SITE_OTHER): Payer: Medicare HMO | Admitting: Pharmacist Clinician (PhC)/ Clinical Pharmacy Specialist

## 2013-10-26 DIAGNOSIS — I4891 Unspecified atrial fibrillation: Secondary | ICD-10-CM

## 2013-10-26 DIAGNOSIS — I4821 Permanent atrial fibrillation: Secondary | ICD-10-CM

## 2013-10-26 DIAGNOSIS — Z7901 Long term (current) use of anticoagulants: Secondary | ICD-10-CM

## 2013-10-26 LAB — PROTIME-INR
INR: 2.73 — ABNORMAL HIGH (ref <1.50)
Prothrombin Time: 28.2 seconds — ABNORMAL HIGH (ref 11.6–15.2)

## 2013-11-16 ENCOUNTER — Ambulatory Visit (INDEPENDENT_AMBULATORY_CARE_PROVIDER_SITE_OTHER): Payer: Medicare HMO | Admitting: Pharmacist Clinician (PhC)/ Clinical Pharmacy Specialist

## 2013-11-16 DIAGNOSIS — I4891 Unspecified atrial fibrillation: Secondary | ICD-10-CM

## 2013-11-16 DIAGNOSIS — Z7901 Long term (current) use of anticoagulants: Secondary | ICD-10-CM

## 2013-11-16 DIAGNOSIS — I4821 Permanent atrial fibrillation: Secondary | ICD-10-CM

## 2013-11-16 LAB — PROTIME-INR
INR: 3.53 — ABNORMAL HIGH (ref <1.50)
PROTHROMBIN TIME: 34.3 s — AB (ref 11.6–15.2)

## 2013-11-26 ENCOUNTER — Telehealth: Payer: Self-pay | Admitting: Cardiovascular Disease

## 2013-11-26 MED ORDER — DILTIAZEM HCL ER COATED BEADS 240 MG PO CP24: 240.0000 mg | ORAL_CAPSULE | Freq: Every day | ORAL | Status: DC

## 2013-11-26 NOTE — Telephone Encounter (Signed)
[facility] told her to have you to fax in a new prescription for his Diltiazem 240 mg #90 and refills.

## 2013-11-26 NOTE — Telephone Encounter (Signed)
Spoke with wife and informed her the Rx refills had been sent to the pharmacy

## 2013-11-30 NOTE — Telephone Encounter (Signed)
Closed encounter °

## 2013-12-04 ENCOUNTER — Other Ambulatory Visit (HOSPITAL_COMMUNITY): Payer: Self-pay | Admitting: Internal Medicine

## 2013-12-04 ENCOUNTER — Ambulatory Visit (HOSPITAL_COMMUNITY)
Admission: RE | Admit: 2013-12-04 | Discharge: 2013-12-04 | Disposition: A | Discharge status: Home / Self Care | Payer: Medicare HMO | Source: Ambulatory Visit | Attending: Internal Medicine | Admitting: Internal Medicine

## 2013-12-04 DIAGNOSIS — R0600 Dyspnea, unspecified: Secondary | ICD-10-CM

## 2013-12-04 DIAGNOSIS — R0602 Shortness of breath: Secondary | ICD-10-CM | POA: Insufficient documentation

## 2013-12-05 ENCOUNTER — Other Ambulatory Visit: Payer: Self-pay | Admitting: Pharmacist Clinician (PhC)/ Clinical Pharmacy Specialist

## 2013-12-05 DIAGNOSIS — I4891 Unspecified atrial fibrillation: Secondary | ICD-10-CM

## 2013-12-05 DIAGNOSIS — Z79899 Other long term (current) drug therapy: Secondary | ICD-10-CM

## 2013-12-20 ENCOUNTER — Other Ambulatory Visit: Payer: Self-pay | Admitting: Pharmacist Clinician (PhC)/ Clinical Pharmacy Specialist

## 2013-12-20 LAB — PROTIME-INR
INR: 3.87 — AB (ref <1.50)
Prothrombin Time: 38 seconds — ABNORMAL HIGH (ref 11.6–15.2)

## 2013-12-21 ENCOUNTER — Ambulatory Visit (INDEPENDENT_AMBULATORY_CARE_PROVIDER_SITE_OTHER): Payer: Medicare HMO | Admitting: Pharmacist Clinician (PhC)/ Clinical Pharmacy Specialist

## 2013-12-21 DIAGNOSIS — I4821 Permanent atrial fibrillation: Secondary | ICD-10-CM

## 2013-12-21 DIAGNOSIS — I4891 Unspecified atrial fibrillation: Secondary | ICD-10-CM

## 2013-12-21 DIAGNOSIS — Z7901 Long term (current) use of anticoagulants: Secondary | ICD-10-CM

## 2014-01-03 ENCOUNTER — Other Ambulatory Visit: Payer: Self-pay | Admitting: Cardiovascular Disease

## 2014-01-03 LAB — PROTIME-INR
INR: 2.76 — ABNORMAL HIGH (ref <1.50)
Prothrombin Time: 29.2 seconds — ABNORMAL HIGH (ref 11.6–15.2)

## 2014-01-04 ENCOUNTER — Ambulatory Visit (INDEPENDENT_AMBULATORY_CARE_PROVIDER_SITE_OTHER): Payer: Medicare HMO | Admitting: Pharmacist

## 2014-01-04 DIAGNOSIS — I4891 Unspecified atrial fibrillation: Secondary | ICD-10-CM

## 2014-01-04 DIAGNOSIS — I4821 Permanent atrial fibrillation: Secondary | ICD-10-CM

## 2014-01-04 DIAGNOSIS — Z7901 Long term (current) use of anticoagulants: Secondary | ICD-10-CM

## 2014-01-08 ENCOUNTER — Other Ambulatory Visit: Payer: Self-pay | Admitting: Pharmacist Clinician (PhC)/ Clinical Pharmacy Specialist

## 2014-01-30 ENCOUNTER — Other Ambulatory Visit: Payer: Self-pay | Admitting: Cardiovascular Disease

## 2014-01-31 ENCOUNTER — Ambulatory Visit (INDEPENDENT_AMBULATORY_CARE_PROVIDER_SITE_OTHER): Payer: Medicare HMO | Admitting: Cardiovascular Disease

## 2014-01-31 ENCOUNTER — Telehealth: Payer: Self-pay | Admitting: Cardiovascular Disease

## 2014-01-31 ENCOUNTER — Telehealth: Payer: Self-pay | Admitting: *Deleted

## 2014-01-31 DIAGNOSIS — Z7901 Long term (current) use of anticoagulants: Secondary | ICD-10-CM

## 2014-01-31 DIAGNOSIS — I4891 Unspecified atrial fibrillation: Secondary | ICD-10-CM

## 2014-01-31 DIAGNOSIS — I4821 Permanent atrial fibrillation: Secondary | ICD-10-CM

## 2014-01-31 LAB — PROTIME-INR
INR: 5.22 — AB (ref <1.50)
PROTHROMBIN TIME: 48 s — AB (ref 11.6–15.2)

## 2014-01-31 NOTE — Telephone Encounter (Signed)
Critical INR results forwarded to [employer] md

## 2014-01-31 NOTE — Telephone Encounter (Signed)
Stanton Kidney is calling for critcal labs

## 2014-01-31 NOTE — Telephone Encounter (Signed)
Have already been notified to hold coumadin x 2 days then restart one tablet daily except 1.5 Mondays & Friday and recheck 02/08/14 per [facility], pharmD.  Will keep appt Monday with Cecilie Kicks, NP although the wife wants him to see Dr. Loletha Grayer.

## 2014-02-04 ENCOUNTER — Ambulatory Visit (INDEPENDENT_AMBULATORY_CARE_PROVIDER_SITE_OTHER): Payer: Commercial Managed Care - HMO | Admitting: Cardiology

## 2014-02-04 ENCOUNTER — Ambulatory Visit (INDEPENDENT_AMBULATORY_CARE_PROVIDER_SITE_OTHER): Payer: Medicare HMO | Admitting: Pharmacist Clinician (PhC)/ Clinical Pharmacy Specialist

## 2014-02-04 ENCOUNTER — Encounter: Payer: Self-pay | Admitting: Cardiology

## 2014-02-04 VITALS — BP 132/86 | HR 82 | Ht 72.0 in | Wt 217.1 lb

## 2014-02-04 DIAGNOSIS — I4821 Permanent atrial fibrillation: Secondary | ICD-10-CM

## 2014-02-04 DIAGNOSIS — Z7901 Long term (current) use of anticoagulants: Secondary | ICD-10-CM

## 2014-02-04 DIAGNOSIS — I428 Other cardiomyopathies: Secondary | ICD-10-CM

## 2014-02-04 DIAGNOSIS — I4891 Unspecified atrial fibrillation: Secondary | ICD-10-CM

## 2014-02-04 DIAGNOSIS — Z79899 Other long term (current) drug therapy: Secondary | ICD-10-CM

## 2014-02-04 DIAGNOSIS — R0602 Shortness of breath: Secondary | ICD-10-CM

## 2014-02-04 LAB — POCT INR: INR: 1.9

## 2014-02-04 MED ORDER — LEVALBUTEROL TARTRATE 45 MCG/ACT IN AERO: 1.0000 | INHALATION_SPRAY | Freq: Two times a day (BID) | RESPIRATORY_TRACT | Status: DC
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
## 2014-02-04 NOTE — Patient Instructions (Signed)
Your physician recommends that you schedule a follow-up appointment in: 2 weeks with Dr. Sallyanne Kuster or Cecilie Kicks NP  Use your xopenex inhaler as directed  We are ordering an Echo to be done soon

## 2014-02-05 DIAGNOSIS — R0602 Shortness of breath: Secondary | ICD-10-CM | POA: Insufficient documentation

## 2014-02-05 NOTE — Assessment & Plan Note (Signed)
followed by pharmacist

## 2014-02-05 NOTE — Assessment & Plan Note (Signed)
Rate controlled 

## 2014-02-05 NOTE — Assessment & Plan Note (Addendum)
Not with exertion but if he lies down he becomes SOB and has to sit up.  He has tried nasal strips, but is not sure what is happening, he does not feel he has much fluid though abd seems larger.  Will recheck Echo to eval EF 35% over 1 year ago. Mild AR and mild MR.  He will follow up with Dr. Jerilynn Mages. Croitoru or on day I am here and Dr. Jerilynn Mages. Croitoru is here as well.  will add Xopenex for SOB and wheezes.  Also discussed musinex 600 mg 1-2 twice a day.

## 2014-02-05 NOTE — Assessment & Plan Note (Signed)
Recheck echo.  This may be reason for SOB.

## 2014-02-14 ENCOUNTER — Other Ambulatory Visit: Payer: Self-pay | Admitting: Cardiovascular Disease

## 2014-02-14 ENCOUNTER — Ambulatory Visit (HOSPITAL_COMMUNITY)
Admission: RE | Admit: 2014-02-14 | Discharge: 2014-02-14 | Disposition: A | Discharge status: Home / Self Care | Payer: Medicare HMO | Source: Ambulatory Visit | Attending: Cardiology | Admitting: Cardiology

## 2014-02-14 DIAGNOSIS — E785 Hyperlipidemia, unspecified: Secondary | ICD-10-CM | POA: Diagnosis not present

## 2014-02-14 DIAGNOSIS — R0602 Shortness of breath: Secondary | ICD-10-CM

## 2014-02-14 DIAGNOSIS — E119 Type 2 diabetes mellitus without complications: Secondary | ICD-10-CM | POA: Diagnosis not present

## 2014-02-14 DIAGNOSIS — I509 Heart failure, unspecified: Secondary | ICD-10-CM | POA: Insufficient documentation

## 2014-02-14 DIAGNOSIS — I10 Essential (primary) hypertension: Secondary | ICD-10-CM | POA: Diagnosis not present

## 2014-02-14 DIAGNOSIS — I4891 Unspecified atrial fibrillation: Secondary | ICD-10-CM | POA: Insufficient documentation

## 2014-02-14 DIAGNOSIS — I428 Other cardiomyopathies: Secondary | ICD-10-CM | POA: Insufficient documentation

## 2014-02-14 DIAGNOSIS — R0989 Other specified symptoms and signs involving the circulatory and respiratory systems: Secondary | ICD-10-CM | POA: Diagnosis present

## 2014-02-14 DIAGNOSIS — I059 Rheumatic mitral valve disease, unspecified: Secondary | ICD-10-CM | POA: Diagnosis not present

## 2014-02-14 DIAGNOSIS — R0609 Other forms of dyspnea: Secondary | ICD-10-CM

## 2014-02-14 LAB — PROTIME-INR
INR: 2.85 — ABNORMAL HIGH (ref <1.50)
Prothrombin Time: 29.9 seconds — ABNORMAL HIGH (ref 11.6–15.2)

## 2014-02-14 NOTE — Progress Notes (Addendum)
2D Echocardiogram Complete.  02/14/2014   Deliah Boston, RDCS   Preliminary Technician Findings:  The Left Ventricular Ejection Fraction is severely decreased (as seen Prior), in the range of 15-20%.

## 2014-02-15 ENCOUNTER — Ambulatory Visit (INDEPENDENT_AMBULATORY_CARE_PROVIDER_SITE_OTHER): Payer: Commercial Managed Care - HMO | Admitting: Pharmacist Clinician (PhC)/ Clinical Pharmacy Specialist

## 2014-02-15 DIAGNOSIS — I4891 Unspecified atrial fibrillation: Secondary | ICD-10-CM | POA: Diagnosis not present

## 2014-02-15 DIAGNOSIS — Z7901 Long term (current) use of anticoagulants: Secondary | ICD-10-CM

## 2014-02-15 DIAGNOSIS — Z5181 Encounter for therapeutic drug level monitoring: Secondary | ICD-10-CM

## 2014-02-15 DIAGNOSIS — I4821 Permanent atrial fibrillation: Secondary | ICD-10-CM

## 2014-02-15 NOTE — Patient Instructions (Signed)
Pt was here with daughter earlier this month, daughter was trying to get his INR checks in [place] with Dr. Karie Kirks, as his wife gets her INRs checked there.  LMOM today for wife to call and let us know if Dr. Karie Kirks has taken over care.  Did LM that INR was therapeutic.

## 2014-02-20 ENCOUNTER — Encounter: Payer: Self-pay | Admitting: Cardiovascular Disease

## 2014-02-20 ENCOUNTER — Ambulatory Visit (INDEPENDENT_AMBULATORY_CARE_PROVIDER_SITE_OTHER): Payer: Commercial Managed Care - HMO | Admitting: Cardiovascular Disease

## 2014-02-20 VITALS — BP 140/80 | HR 78 | Resp 22 | Ht 72.0 in | Wt 217.0 lb

## 2014-02-20 DIAGNOSIS — Z79899 Other long term (current) drug therapy: Secondary | ICD-10-CM | POA: Diagnosis not present

## 2014-02-20 DIAGNOSIS — I509 Heart failure, unspecified: Secondary | ICD-10-CM

## 2014-02-20 DIAGNOSIS — I5023 Acute on chronic systolic (congestive) heart failure: Secondary | ICD-10-CM | POA: Diagnosis not present

## 2014-02-20 DIAGNOSIS — I4891 Unspecified atrial fibrillation: Secondary | ICD-10-CM | POA: Diagnosis not present

## 2014-02-20 DIAGNOSIS — I4821 Permanent atrial fibrillation: Secondary | ICD-10-CM

## 2014-02-20 MED ORDER — FUROSEMIDE 40 MG PO TABS: 40.0000 mg | ORAL_TABLET | Freq: Every day | ORAL | Status: DC

## 2014-02-20 MED ORDER — LISINOPRIL 10 MG PO TABS: 10.0000 mg | ORAL_TABLET | Freq: Every day | ORAL | Status: DC
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
## 2014-02-20 NOTE — Assessment & Plan Note (Signed)
Stopping his warfarin briefly for tooth extraction will probably be safe, as he has never had embolic events. I would avoid prolonged discontinuation since he has left ventricular dysfunction.

## 2014-02-20 NOTE — Patient Instructions (Signed)
STOP Diltiazem.  START Lisinopril 10mg  daily.  START Furosemide 40mg  daily.  Your physician recommends that you return for lab work in: One week.  Dr. Sallyanne Kuster recommends that you schedule a follow-up appointment in: 10 days.

## 2014-02-20 NOTE — Assessment & Plan Note (Signed)
Retrospectively, the diagnosis of tachycardia related cardiomyopathy was only part of Mr.Artman's problem. 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 We'll discontinue diltiazem. Add lisinopril and furosemide. The dose of beta blocker may have to be increased to provide adequate rate control once his diltiazem his stopped. I am reluctant to increase the beta blocker right now since he is clearly in acute heart failure. Added and digoxin may be another option if his blood pressure is too low.

## 2014-02-28 LAB — BASIC METABOLIC PANEL
BUN: 26 mg/dL — ABNORMAL HIGH (ref 6–23)
CALCIUM: 9.7 mg/dL (ref 8.4–10.5)
CO2: 28 meq/L (ref 19–32)
Chloride: 100 mEq/L (ref 96–112)
Creat: 0.96 mg/dL (ref 0.50–1.35)
Glucose, Bld: 277 mg/dL — ABNORMAL HIGH (ref 70–99)
Potassium: 4.5 mEq/L (ref 3.5–5.3)
Sodium: 137 mEq/L (ref 135–145)

## 2014-03-01 ENCOUNTER — Encounter: Payer: Self-pay | Admitting: Cardiovascular Disease

## 2014-03-01 ENCOUNTER — Ambulatory Visit (INDEPENDENT_AMBULATORY_CARE_PROVIDER_SITE_OTHER): Payer: Commercial Managed Care - HMO | Admitting: Cardiovascular Disease

## 2014-03-01 ENCOUNTER — Ambulatory Visit (INDEPENDENT_AMBULATORY_CARE_PROVIDER_SITE_OTHER): Payer: Commercial Managed Care - HMO | Admitting: Pharmacist Clinician (PhC)/ Clinical Pharmacy Specialist

## 2014-03-01 VITALS — BP 134/80 | HR 62 | Resp 16 | Ht 70.0 in | Wt 208.0 lb

## 2014-03-01 DIAGNOSIS — I428 Other cardiomyopathies: Secondary | ICD-10-CM | POA: Insufficient documentation

## 2014-03-01 DIAGNOSIS — Z7901 Long term (current) use of anticoagulants: Secondary | ICD-10-CM

## 2014-03-01 DIAGNOSIS — I4821 Permanent atrial fibrillation: Secondary | ICD-10-CM

## 2014-03-01 DIAGNOSIS — I509 Heart failure, unspecified: Secondary | ICD-10-CM

## 2014-03-01 DIAGNOSIS — I5023 Acute on chronic systolic (congestive) heart failure: Secondary | ICD-10-CM

## 2014-03-01 DIAGNOSIS — I4891 Unspecified atrial fibrillation: Secondary | ICD-10-CM

## 2014-03-01 LAB — POCT INR: INR: 1.3

## 2014-03-01 MED ORDER — METOPROLOL TARTRATE 50 MG PO TABS: 75.0000 mg | ORAL_TABLET | Freq: Two times a day (BID) | ORAL | Status: DC
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
## 2014-03-01 NOTE — Patient Instructions (Signed)
Your physician recommends that you weigh, daily, at the same time every day, and in the same amount of clothing. Please record your daily weights on the handout provided and bring it to your next appointment.         If your weight goes above 205 lbs please call our office.  STOP CARTIA.  Increase Metoprolol 50mg  to 1 1/2 tablets twice a day.  Your physician has requested that you have an echocardiogram at [facility] Echocardiography is a painless test that uses sound waves to create images of your heart. It provides your doctor with information about the size and shape of your heart and how well your heart's chambers and valves are working. This procedure takes approximately one hour. There are no restrictions for this procedure.  Dr. Sallyanne Kuster recommends that you schedule a follow-up appointment in: 3 weeks.

## 2014-03-19 ENCOUNTER — Telehealth: Payer: Self-pay | Admitting: Cardiovascular Disease

## 2014-03-19 NOTE — Telephone Encounter (Signed)
Justin [name], [place] advised taking extra 40mg  lasix TODAY.   Informed daughter of this. She voiced understanding.   Will follow up at office visit tomorrow with Dr. Loletha Grayer

## 2014-03-19 NOTE — Telephone Encounter (Signed)
Spoke with patient's daughter. He has gained about 4lbs in 2-4 days and has had to sleep propped up since Sunday. She reports his legs are so swollen they are "tight". He is SOB when he is up doing any activity. He had OV 9/23 am with Dr. Sallyanne Kuster.

## 2014-03-19 NOTE — Telephone Encounter (Signed)
Pt have gained 4 lbs in the last few days,he can not lay down. He legs are so tight, Dr C told him to call when this happen. He does have an appt tomorrow morning.

## 2014-03-20 ENCOUNTER — Ambulatory Visit (INDEPENDENT_AMBULATORY_CARE_PROVIDER_SITE_OTHER): Payer: Commercial Managed Care - HMO | Admitting: Cardiovascular Disease

## 2014-03-20 ENCOUNTER — Encounter: Payer: Self-pay | Admitting: Cardiovascular Disease

## 2014-03-20 VITALS — BP 128/81 | HR 54 | Resp 18 | Ht 71.0 in | Wt 209.5 lb

## 2014-03-20 DIAGNOSIS — I4891 Unspecified atrial fibrillation: Secondary | ICD-10-CM

## 2014-03-20 DIAGNOSIS — I10 Essential (primary) hypertension: Secondary | ICD-10-CM

## 2014-03-20 DIAGNOSIS — Z79899 Other long term (current) drug therapy: Secondary | ICD-10-CM

## 2014-03-20 DIAGNOSIS — I509 Heart failure, unspecified: Secondary | ICD-10-CM

## 2014-03-20 DIAGNOSIS — I4821 Permanent atrial fibrillation: Secondary | ICD-10-CM

## 2014-03-20 DIAGNOSIS — I428 Other cardiomyopathies: Secondary | ICD-10-CM

## 2014-03-20 DIAGNOSIS — I5023 Acute on chronic systolic (congestive) heart failure: Secondary | ICD-10-CM

## 2014-03-20 MED ORDER — FUROSEMIDE 40 MG PO TABS: 80.0000 mg | ORAL_TABLET | Freq: Every day | ORAL | Status: DC

## 2014-03-20 MED ORDER — LISINOPRIL 20 MG PO TABS: 20.0000 mg | ORAL_TABLET | Freq: Every day | ORAL | Status: DC

## 2014-03-20 NOTE — Patient Instructions (Signed)
INCREASE Lisinopril to 20mg  daily.  INCREASE Furosemide to 80mg  each morning.  Your physician recommends that you return for lab work in: 2-3 weeks.  Dr. Sallyanne Kuster recommends that you schedule a follow-up appointment in: One month.

## 2014-03-22 ENCOUNTER — Ambulatory Visit (INDEPENDENT_AMBULATORY_CARE_PROVIDER_SITE_OTHER): Payer: Commercial Managed Care - HMO | Admitting: *Deleted

## 2014-03-22 DIAGNOSIS — Z79899 Other long term (current) drug therapy: Secondary | ICD-10-CM

## 2014-03-22 DIAGNOSIS — Z7901 Long term (current) use of anticoagulants: Secondary | ICD-10-CM

## 2014-03-22 DIAGNOSIS — I4891 Unspecified atrial fibrillation: Secondary | ICD-10-CM

## 2014-03-22 LAB — POCT INR: INR: 3.4

## 2014-03-24 ENCOUNTER — Encounter: Payer: Self-pay | Admitting: Cardiovascular Disease
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
## 2014-04-09 ENCOUNTER — Ambulatory Visit (INDEPENDENT_AMBULATORY_CARE_PROVIDER_SITE_OTHER): Payer: Commercial Managed Care - HMO | Admitting: *Deleted

## 2014-04-09 DIAGNOSIS — Z7901 Long term (current) use of anticoagulants: Secondary | ICD-10-CM

## 2014-04-09 DIAGNOSIS — Z79899 Other long term (current) drug therapy: Secondary | ICD-10-CM

## 2014-04-09 DIAGNOSIS — I4891 Unspecified atrial fibrillation: Secondary | ICD-10-CM

## 2014-04-09 LAB — BASIC METABOLIC PANEL
BUN: 28 mg/dL — ABNORMAL HIGH (ref 6–23)
CO2: 22 mEq/L (ref 19–32)
Calcium: 9.6 mg/dL (ref 8.4–10.5)
Chloride: 104 mEq/L (ref 96–112)
Creat: 0.98 mg/dL (ref 0.50–1.35)
Glucose, Bld: 207 mg/dL — ABNORMAL HIGH (ref 70–99)
POTASSIUM: 3.5 meq/L (ref 3.5–5.3)
SODIUM: 144 meq/L (ref 135–145)

## 2014-04-09 LAB — POCT INR: INR: 3.7

## 2014-04-25 ENCOUNTER — Encounter: Payer: Self-pay | Admitting: Cardiovascular Disease

## 2014-04-25 ENCOUNTER — Ambulatory Visit (INDEPENDENT_AMBULATORY_CARE_PROVIDER_SITE_OTHER): Payer: Commercial Managed Care - HMO | Admitting: Cardiovascular Disease

## 2014-04-25 VITALS — BP 129/86 | HR 93 | Resp 16 | Ht 70.0 in | Wt 207.8 lb

## 2014-04-25 DIAGNOSIS — I4821 Permanent atrial fibrillation: Secondary | ICD-10-CM

## 2014-04-25 DIAGNOSIS — I429 Cardiomyopathy, unspecified: Secondary | ICD-10-CM

## 2014-04-25 DIAGNOSIS — I482 Chronic atrial fibrillation: Secondary | ICD-10-CM

## 2014-04-25 DIAGNOSIS — I10 Essential (primary) hypertension: Secondary | ICD-10-CM

## 2014-04-25 DIAGNOSIS — I5022 Chronic systolic (congestive) heart failure: Secondary | ICD-10-CM

## 2014-04-25 DIAGNOSIS — I428 Other cardiomyopathies: Secondary | ICD-10-CM

## 2014-04-25 NOTE — Patient Instructions (Signed)
Follow-up with Dr. Harl Bowie in [place] in 3 months.
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
## 2014-04-30 ENCOUNTER — Ambulatory Visit (INDEPENDENT_AMBULATORY_CARE_PROVIDER_SITE_OTHER): Payer: Commercial Managed Care - HMO | Admitting: *Deleted

## 2014-04-30 DIAGNOSIS — Z79899 Other long term (current) drug therapy: Secondary | ICD-10-CM

## 2014-04-30 DIAGNOSIS — Z7901 Long term (current) use of anticoagulants: Secondary | ICD-10-CM

## 2014-04-30 DIAGNOSIS — I4891 Unspecified atrial fibrillation: Secondary | ICD-10-CM

## 2014-04-30 LAB — POCT INR: INR: 4.6

## 2014-05-14 ENCOUNTER — Ambulatory Visit (INDEPENDENT_AMBULATORY_CARE_PROVIDER_SITE_OTHER): Payer: Commercial Managed Care - HMO | Admitting: *Deleted

## 2014-05-14 DIAGNOSIS — Z7901 Long term (current) use of anticoagulants: Secondary | ICD-10-CM

## 2014-05-14 DIAGNOSIS — Z79899 Other long term (current) drug therapy: Secondary | ICD-10-CM

## 2014-05-14 DIAGNOSIS — I4891 Unspecified atrial fibrillation: Secondary | ICD-10-CM

## 2014-05-14 LAB — POCT INR: INR: 3.8

## 2014-06-06 ENCOUNTER — Encounter (HOSPITAL_COMMUNITY): Payer: Self-pay | Admitting: Cardiovascular Disease

## 2014-06-10 ENCOUNTER — Telehealth: Payer: Self-pay | Admitting: *Deleted

## 2014-06-10 NOTE — Telephone Encounter (Signed)
Signed standing order faxed to [facility] for PT/INR.

## 2014-06-11 ENCOUNTER — Ambulatory Visit (HOSPITAL_COMMUNITY)
Admission: RE | Admit: 2014-06-11 | Discharge: 2014-06-11 | Disposition: A | Discharge status: Home / Self Care | Payer: Commercial Managed Care - HMO | Source: Ambulatory Visit | Attending: Cardiovascular Disease | Admitting: Cardiovascular Disease

## 2014-06-11 DIAGNOSIS — I509 Heart failure, unspecified: Secondary | ICD-10-CM | POA: Diagnosis not present

## 2014-06-11 DIAGNOSIS — I351 Nonrheumatic aortic (valve) insufficiency: Secondary | ICD-10-CM | POA: Diagnosis not present

## 2014-06-11 DIAGNOSIS — I340 Nonrheumatic mitral (valve) insufficiency: Secondary | ICD-10-CM | POA: Diagnosis not present

## 2014-06-11 DIAGNOSIS — I4891 Unspecified atrial fibrillation: Secondary | ICD-10-CM | POA: Diagnosis not present

## 2014-06-11 DIAGNOSIS — E785 Hyperlipidemia, unspecified: Secondary | ICD-10-CM | POA: Insufficient documentation

## 2014-06-11 DIAGNOSIS — I358 Other nonrheumatic aortic valve disorders: Secondary | ICD-10-CM | POA: Insufficient documentation

## 2014-06-11 DIAGNOSIS — I10 Essential (primary) hypertension: Secondary | ICD-10-CM | POA: Insufficient documentation

## 2014-06-11 DIAGNOSIS — I428 Other cardiomyopathies: Secondary | ICD-10-CM

## 2014-06-11 DIAGNOSIS — I429 Cardiomyopathy, unspecified: Secondary | ICD-10-CM | POA: Insufficient documentation

## 2014-06-11 DIAGNOSIS — E119 Type 2 diabetes mellitus without complications: Secondary | ICD-10-CM | POA: Insufficient documentation

## 2014-06-11 DIAGNOSIS — I059 Rheumatic mitral valve disease, unspecified: Secondary | ICD-10-CM

## 2014-06-11 DIAGNOSIS — I071 Rheumatic tricuspid insufficiency: Secondary | ICD-10-CM | POA: Diagnosis not present

## 2014-06-11 NOTE — Progress Notes (Signed)
  Echocardiogram 2D Echocardiogram has been performed.  [place], [place] 06/11/2014, 1:51 PM

## 2014-06-13 ENCOUNTER — Telehealth: Payer: Self-pay | Admitting: Cardiology

## 2014-06-13 ENCOUNTER — Telehealth: Payer: Self-pay | Admitting: Cardiovascular Disease

## 2014-06-13 NOTE — Telephone Encounter (Signed)
Pt have gained 5lbs over the weight he needs to be. He was told to call if this happen,so he could increase his Furosemide.

## 2014-06-13 NOTE — Telephone Encounter (Signed)
Attempted to call - left VM

## 2014-06-13 NOTE — Telephone Encounter (Signed)
Dr C has suggested that he increase his fluid pill of he gets over 105. She wants to make sure that is ok.

## 2014-06-14 MED ORDER — LISINOPRIL 40 MG PO TABS: 40.0000 mg | ORAL_TABLET | Freq: Every day | ORAL | Status: DC

## 2014-06-14 NOTE — Telephone Encounter (Signed)
[name] is returning [name]'s call

## 2014-06-14 NOTE — Telephone Encounter (Signed)
PATIENT HAS SCHEDULE IN feb 2016 with DR branch WIFE AGAIN JUST WANTED TO MAKE DR Croitoru aware of weight.

## 2014-06-14 NOTE — Telephone Encounter (Signed)
ECHO -   Notes Recorded by Sanda Klein, MD on 06/12/2014 at 8:47 AM Unfortunately, LV function is steadily deteriorating despite medication. If BP allows, I would increase lisinopril to 40 mg daily. I believe the plan is for him to follow up with Carlyle Dolly in [facility].  ------------------------------------------------------  Wife notified of above.  New rx sent to [facility].   Wife stated that Dr. Loletha Grayer had suggested he increase Lasix if weight over 205 lb.  Weighed 210 yesterday.  Stated that MD would advise him what to do.   Rescheduled his February OV to 07/10/2014 as suggested by Dr. Harl Bowie.

## 2014-06-14 NOTE — Telephone Encounter (Signed)
Spoke to wife Wife states she wanted Dr C TO be aware that patient weight was up to 210 LBS but the weight ws done at PCP office not home scale. She states patient does not show her his weight at home,but she looked on calender ranging 200 to 206LBS.  She states she wanted Dr C TO BE AWARE BECAUSE HE TOLD THEM THAT AT LAST VISIT. SHE STATES THEY HAVE NOT SEEN DR BRANCH YET  AND WANTED TO MAKE SURE THAT DR C.  HAD A DISCUSSION ABOUT THE PATIENT WITH DR DR Harl Bowie.  RN INFORMED WIFE THAT THE HOME SCALE IS THE WEIGHT TO BE MORE CONCERNED WITH. PER  WIFE,THE PCP STATED THAT THE PATIENT DID NOT HAVE ANY SYMPTOMS OF EXCESS FLUID. RN REASSURED WIFE. AND INFORMED HER WILL LET DR C. AND DR BRANCH KNOW.  SPENT 20 MIN TALKING WITH WIFE.

## 2014-06-17 NOTE — Telephone Encounter (Signed)
Wife states his bottle of Lasix has 40mg  daily on it.  Reviewed with her that the last rx for Laxix that was sent to pharm by Dr. Loletha Grayer was for 40mg  - taking 2 tabs (80mg ) daily done on 03/20/14.  Wife not sure about this & stated that husband could not read or write.  He goes by colors and shapes.  States his weight is staying about the same.  Advised her to continue everything the same for now until next OV on 07/13/14 with Dr. Harl Bowie unless anything else changes.  Wife verbalized understanding.

## 2014-06-17 NOTE — Telephone Encounter (Signed)
Please clarify current dose of lasix patient is on, our notes indicate 80mg  daily. If so, then ok to take an additional 40mg  when weight is elevated, I would take 80mg  in monring and 40mg  at night. If weight decreases below 205 then resume the previous 80mg  daily   Zandra Abts MD

## 2014-06-18 ENCOUNTER — Ambulatory Visit (INDEPENDENT_AMBULATORY_CARE_PROVIDER_SITE_OTHER): Payer: Commercial Managed Care - HMO | Admitting: Cardiology

## 2014-06-18 ENCOUNTER — Encounter: Payer: Self-pay | Admitting: Cardiology

## 2014-06-18 VITALS — BP 130/88 | HR 83 | Ht 70.0 in | Wt 214.6 lb

## 2014-06-18 DIAGNOSIS — E785 Hyperlipidemia, unspecified: Secondary | ICD-10-CM

## 2014-06-18 DIAGNOSIS — I4891 Unspecified atrial fibrillation: Secondary | ICD-10-CM

## 2014-06-18 DIAGNOSIS — I10 Essential (primary) hypertension: Secondary | ICD-10-CM

## 2014-06-18 DIAGNOSIS — I5022 Chronic systolic (congestive) heart failure: Secondary | ICD-10-CM

## 2014-06-18 MED ORDER — FUROSEMIDE 40 MG PO TABS: ORAL_TABLET | ORAL | Status: DC

## 2014-06-18 MED ORDER — CARVEDILOL 12.5 MG PO TABS: 12.5000 mg | ORAL_TABLET | Freq: Two times a day (BID) | ORAL | Status: DC
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
## 2014-06-18 NOTE — Patient Instructions (Signed)
Your physician recommends that you schedule a follow-up appointment in: in [place] in 33 month   Your physician has recommended you make the following change in your medication:    STOP Lopressor  START Coreg 12.5 mg twice a day   INCREASE Lasix to 80 mg (2 pills) in the morning, and 20 mg (1/2 pill) in the pm      Thank you for choosing [facility] !

## 2014-06-26 ENCOUNTER — Telehealth: Payer: Self-pay | Admitting: Cardiovascular Disease

## 2014-06-26 NOTE — Telephone Encounter (Signed)
Please call,she wants to give you an update on patient's condition.

## 2014-06-26 NOTE — Telephone Encounter (Signed)
Agree with increased lasix

## 2014-06-26 NOTE — Telephone Encounter (Signed)
Pt's wife just wanted to make Dr. Sallyanne Kuster aware of recent medication change. (Dr. Harl Bowie increased lasix)  No other concerns stated.

## 2014-07-02 ENCOUNTER — Ambulatory Visit (HOSPITAL_COMMUNITY)
Admission: RE | Admit: 2014-07-02 | Discharge: 2014-07-02 | Disposition: A | Discharge status: Home / Self Care | Payer: Medicare HMO | Source: Ambulatory Visit | Attending: Family Medicine | Admitting: Family Medicine

## 2014-07-02 ENCOUNTER — Other Ambulatory Visit (HOSPITAL_COMMUNITY): Payer: Self-pay | Admitting: Family Medicine

## 2014-07-02 DIAGNOSIS — R05 Cough: Secondary | ICD-10-CM | POA: Insufficient documentation

## 2014-07-02 DIAGNOSIS — I517 Cardiomegaly: Secondary | ICD-10-CM | POA: Diagnosis not present

## 2014-07-02 DIAGNOSIS — K449 Diaphragmatic hernia without obstruction or gangrene: Secondary | ICD-10-CM | POA: Diagnosis not present

## 2014-07-02 DIAGNOSIS — R059 Cough, unspecified: Secondary | ICD-10-CM

## 2014-07-02 DIAGNOSIS — R0602 Shortness of breath: Secondary | ICD-10-CM | POA: Diagnosis present

## 2014-07-02 DIAGNOSIS — J984 Other disorders of lung: Secondary | ICD-10-CM | POA: Insufficient documentation

## 2014-07-03 ENCOUNTER — Other Ambulatory Visit: Payer: Self-pay | Admitting: *Deleted

## 2014-07-10 ENCOUNTER — Ambulatory Visit: Payer: Commercial Managed Care - HMO | Admitting: Cardiology

## 2014-07-11 ENCOUNTER — Encounter: Payer: Self-pay | Admitting: Cardiology

## 2014-07-11 ENCOUNTER — Other Ambulatory Visit: Payer: Self-pay | Admitting: Cardiology

## 2014-07-11 ENCOUNTER — Ambulatory Visit (INDEPENDENT_AMBULATORY_CARE_PROVIDER_SITE_OTHER): Payer: Medicare HMO | Admitting: Cardiology

## 2014-07-11 ENCOUNTER — Ambulatory Visit (INDEPENDENT_AMBULATORY_CARE_PROVIDER_SITE_OTHER): Payer: Medicare HMO | Admitting: *Deleted

## 2014-07-11 VITALS — BP 96/65 | HR 51 | Ht 70.0 in | Wt 204.0 lb

## 2014-07-11 DIAGNOSIS — I5022 Chronic systolic (congestive) heart failure: Secondary | ICD-10-CM

## 2014-07-11 DIAGNOSIS — Z7901 Long term (current) use of anticoagulants: Secondary | ICD-10-CM

## 2014-07-11 DIAGNOSIS — Z79899 Other long term (current) drug therapy: Secondary | ICD-10-CM

## 2014-07-11 DIAGNOSIS — I10 Essential (primary) hypertension: Secondary | ICD-10-CM

## 2014-07-11 DIAGNOSIS — E785 Hyperlipidemia, unspecified: Secondary | ICD-10-CM

## 2014-07-11 DIAGNOSIS — I4891 Unspecified atrial fibrillation: Secondary | ICD-10-CM

## 2014-07-11 LAB — POCT INR: INR: 2
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
## 2014-07-11 NOTE — Patient Instructions (Signed)
   Labs for BMET, Magnesium   Office will contact with results via phone or letter.    [facility] referral   Continue all current medications.  Follow up in  1 month

## 2014-07-12 ENCOUNTER — Telehealth: Payer: Self-pay | Admitting: *Deleted

## 2014-07-12 ENCOUNTER — Encounter: Payer: Self-pay | Admitting: *Deleted

## 2014-07-12 LAB — MAGNESIUM: MAGNESIUM: 1.7 mg/dL (ref 1.5–2.5)

## 2014-07-12 LAB — BASIC METABOLIC PANEL
BUN: 38 mg/dL — AB (ref 6–23)
CALCIUM: 10.7 mg/dL — AB (ref 8.4–10.5)
CO2: 28 mEq/L (ref 19–32)
CREATININE: 1.05 mg/dL (ref 0.50–1.35)
Chloride: 93 mEq/L — ABNORMAL LOW (ref 96–112)
Glucose, Bld: 412 mg/dL — ABNORMAL HIGH (ref 70–99)
POTASSIUM: 4.4 meq/L (ref 3.5–5.3)
Sodium: 134 mEq/L — ABNORMAL LOW (ref 135–145)

## 2014-07-12 NOTE — Telephone Encounter (Signed)
Received labs, on Dr. Nelly Laurence desk for review

## 2014-07-15 ENCOUNTER — Telehealth: Payer: Self-pay | Admitting: *Deleted

## 2014-07-15 NOTE — Telephone Encounter (Signed)
Pt wife made aware, forwarded to Dr. Anastasio Champion

## 2014-07-15 NOTE — Telephone Encounter (Signed)
-----   Message from Arnoldo Lenis, MD sent at 07/15/2014 11:11 AM EST ----- Labs show that he is likely dehydrated. He had been taking more lasix than prescribed at last visit, and we corrected it. Dehydration should resolve.  Zandra Abts MD

## 2014-07-15 NOTE — Telephone Encounter (Signed)
Noted  

## 2014-07-15 NOTE — Telephone Encounter (Signed)
-----   Message from Laurine Blazer, LPN sent at 0/56/9794  8:41 AM EST ----- Regarding: FW: Order for Justin [name] W   ----- Message -----    From: Danella Maiers    Sent: 07/11/2014   4:09 PM      To: Laurine Blazer, LPN Subject: RE: Order for Catheryn Bacon, RN has been assigned to engage patient for [facility] services.    Please feel free to contact our office with any questions [phone]  Thanks, Lurline Del [facility] CM Assistant  ----- Message -----    From: Laurine Blazer, LPN    Sent: 2/70/7867  12:08 PM      To: [phone] Subject: Order for Justin [name] W                           Patient Name: Justin [name] H([id number]) Sex: Male DOB: 1940-10-01    PCP: Doree Albee   Center: [facility]   Types of orders made on 07/11/2014: Lab, Medications, Nursing  Order Date:07/11/2014 Ordering User:HILL, Debara Pickett [[id number]] Encounter Provider:Jonathan Dale [place], MD [phone] Authorizing Provider: Arnoldo Lenis, MD [phone] Department:[facility][[id number]]  Order Specific Information Order: Consult to [facility] [Custom: [id number]]  Order #: [id number]          Qty: 1   Priority: Routine  Class: Clinic Performed   Resulting Agency: [facility]  Test ID: [id number]   Associated Diagnoses     I50.23 Acute on chronic systolic CHF (congestive heart failure), NYHA class      4     Reason for consult -> medication management at home       Diagnoses of -> Heart Failure       Expected date of contact -> within 10 days       Priority: Routine  Class: Clinic Performed   Resulting Agency: [facility]  Test ID: [id number]   Associated Diagnoses     I50.23 Acute on chronic systolic CHF (congestive heart failure), NYHA class      4     Reason for consult -> medication management at home       Diagnoses of -> Heart Failure       Expected date of contact -> within 10 days

## 2014-07-16 ENCOUNTER — Telehealth: Payer: Self-pay | Admitting: *Deleted

## 2014-07-16 NOTE — Telephone Encounter (Signed)
Will forward to Dr. Harl Bowie for further suggestions.

## 2014-07-16 NOTE — Telephone Encounter (Signed)
Justin [name]   Will route to pcp also

## 2014-07-16 NOTE — Telephone Encounter (Signed)
Dr Harl Bowie put referral in for medication management. They are calling to let us know that patients insurance is not accepted with them. They care suggesting home health services.

## 2014-07-18 NOTE — Telephone Encounter (Signed)
Pt wife called back to clarify furosemide dose. Had her read from the bottle and had her write 2 tabs in Am and 1/2 tab PM. [name] she better understood than 80 mg AM and 20 MG pm. Thanked [place] for being patient. Told her to call us with any more questions.

## 2014-07-18 NOTE — Addendum Note (Signed)
Addended by: Julian Hy T on: 07/18/2014 01:06 PM   Modules accepted: Medications

## 2014-07-18 NOTE — Telephone Encounter (Signed)
Unless pt has recent hospital visit, pt insurance will not cover referral to home health. LM for [facility] nurse to call to clarify, pt wife unclear on situation. Wife wanting to clarify dosing on lasix. Explained lasix 80 mg in the morning and 20 mg evening per last office note on 07/11/14. Will forward to Dr. Harl Bowie as Juluis Rainier.

## 2014-07-18 NOTE — Telephone Encounter (Signed)
Are we able to put in a referral to home health for him for assistance with CHF management and medication compliance   Zandra Abts MD

## 2014-07-23 ENCOUNTER — Telehealth: Payer: Self-pay | Admitting: *Deleted

## 2014-07-23 NOTE — Telephone Encounter (Signed)
Labs received on Dr. Nelly Laurence desk

## 2014-07-30 ENCOUNTER — Ambulatory Visit (INDEPENDENT_AMBULATORY_CARE_PROVIDER_SITE_OTHER): Payer: Medicare HMO | Admitting: Cardiology

## 2014-07-30 ENCOUNTER — Encounter: Payer: Self-pay | Admitting: Cardiology

## 2014-07-30 VITALS — BP 117/68 | HR 92 | Ht 70.0 in | Wt 207.8 lb

## 2014-07-30 DIAGNOSIS — I5022 Chronic systolic (congestive) heart failure: Secondary | ICD-10-CM

## 2014-07-30 DIAGNOSIS — Z7901 Long term (current) use of anticoagulants: Secondary | ICD-10-CM

## 2014-07-30 MED ORDER — CARVEDILOL 25 MG PO TABS: 25.0000 mg | ORAL_TABLET | Freq: Two times a day (BID) | ORAL | Status: DC
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
## 2014-07-30 NOTE — Patient Instructions (Signed)
Your physician recommends that you schedule a follow-up appointment in: 2 months with Dr. Harl Bowie  Your physician has recommended you make the following change in your medication:   INCREASE COREG 25 MG TWICE DAILY  Thank you for choosing [facility]!!

## 2014-08-02 ENCOUNTER — Telehealth: Payer: Self-pay | Admitting: *Deleted

## 2014-08-02 NOTE — Telephone Encounter (Signed)
Requested [facility] for medication management with new insurance. Denied again. Will try home health. Pt needs help with medication. Has problems reading and understanding.    Good Afternoon,         Unfortunately, [facility] does not have a contract with [employer]. [facility] will not be able to accept referral at this time. If you find the patient has a different payor please let our office know.         Please feel free to contact our office with any questions [phone]        Thanks,    Lurline Del    [facility] CM Assistant

## 2014-08-08 ENCOUNTER — Ambulatory Visit (INDEPENDENT_AMBULATORY_CARE_PROVIDER_SITE_OTHER): Payer: Medicare HMO | Admitting: *Deleted

## 2014-08-08 DIAGNOSIS — Z79899 Other long term (current) drug therapy: Secondary | ICD-10-CM

## 2014-08-08 DIAGNOSIS — Z7901 Long term (current) use of anticoagulants: Secondary | ICD-10-CM

## 2014-08-08 DIAGNOSIS — I4891 Unspecified atrial fibrillation: Secondary | ICD-10-CM

## 2014-08-08 LAB — POCT INR: INR: 2.5

## 2014-08-09 ENCOUNTER — Ambulatory Visit: Payer: Commercial Managed Care - HMO | Admitting: Cardiology

## 2014-08-21 ENCOUNTER — Telehealth: Payer: Self-pay | Admitting: *Deleted

## 2014-08-21 MED ORDER — FUROSEMIDE 40 MG PO TABS: ORAL_TABLET | ORAL | Status: DC

## 2014-08-21 NOTE — Telephone Encounter (Signed)
Pt wife called for refill of lasix. Explained instructions again and refilled medication to [facility]

## 2014-09-05 ENCOUNTER — Ambulatory Visit (INDEPENDENT_AMBULATORY_CARE_PROVIDER_SITE_OTHER): Payer: Medicare HMO | Admitting: *Deleted

## 2014-09-05 DIAGNOSIS — Z79899 Other long term (current) drug therapy: Secondary | ICD-10-CM

## 2014-09-05 DIAGNOSIS — I4891 Unspecified atrial fibrillation: Secondary | ICD-10-CM

## 2014-09-05 DIAGNOSIS — Z7901 Long term (current) use of anticoagulants: Secondary | ICD-10-CM

## 2014-09-05 LAB — POCT INR: INR: 2.6

## 2014-10-03 ENCOUNTER — Ambulatory Visit (INDEPENDENT_AMBULATORY_CARE_PROVIDER_SITE_OTHER): Payer: Medicare HMO | Admitting: Cardiology

## 2014-10-03 ENCOUNTER — Encounter: Payer: Self-pay | Admitting: Cardiology

## 2014-10-03 VITALS — BP 124/78 | HR 74 | Ht 70.0 in | Wt 205.4 lb

## 2014-10-03 DIAGNOSIS — I5022 Chronic systolic (congestive) heart failure: Secondary | ICD-10-CM

## 2014-10-03 DIAGNOSIS — I4891 Unspecified atrial fibrillation: Secondary | ICD-10-CM

## 2014-10-03 DIAGNOSIS — I10 Essential (primary) hypertension: Secondary | ICD-10-CM

## 2014-10-03 DIAGNOSIS — Z7901 Long term (current) use of anticoagulants: Secondary | ICD-10-CM

## 2014-10-03 MED ORDER — LOSARTAN POTASSIUM 100 MG PO TABS: 100.0000 mg | ORAL_TABLET | Freq: Every day | ORAL | Status: DC
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
## 2014-10-03 NOTE — Patient Instructions (Signed)
Your physician recommends that you schedule a follow-up appointment on the same day as your coumadin visit as allowed by scheduled.   Your physician has recommended you make the following change in your medication:   INCREASE LOSARTAN 100 MG DAILY  WE WILL REQUEST LABS FROM DR. GOSRANI  Thank you for choosing [facility]!!

## 2014-10-17 ENCOUNTER — Encounter: Payer: Self-pay | Admitting: *Deleted

## 2014-10-18 ENCOUNTER — Telehealth: Payer: Self-pay | Admitting: Cardiology

## 2014-10-18 ENCOUNTER — Ambulatory Visit (INDEPENDENT_AMBULATORY_CARE_PROVIDER_SITE_OTHER): Payer: Medicare HMO | Admitting: Cardiology

## 2014-10-18 ENCOUNTER — Encounter: Payer: Self-pay | Admitting: Cardiology

## 2014-10-18 VITALS — BP 112/69 | HR 106 | Ht 70.0 in | Wt 211.0 lb

## 2014-10-18 DIAGNOSIS — I5022 Chronic systolic (congestive) heart failure: Secondary | ICD-10-CM | POA: Diagnosis not present

## 2014-10-18 DIAGNOSIS — I10 Essential (primary) hypertension: Secondary | ICD-10-CM

## 2014-10-18 DIAGNOSIS — I4891 Unspecified atrial fibrillation: Secondary | ICD-10-CM

## 2014-10-18 MED ORDER — APIXABAN 5 MG PO TABS: 5.0000 mg | ORAL_TABLET | Freq: Two times a day (BID) | ORAL | Status: DC

## 2014-10-18 NOTE — Patient Instructions (Addendum)
   Stop Coumadin today.  Begin Eliquis 5mg  twice a day  - can begin on Sunday, 10/20/2014.    Printed scripts given today.  One for 30 day free trial offer & one for regular prescription.   Follow up in anticoagulation clinic 1 month after beginning Eliquis.   Continue all other medications.   Lab for BMET - order provided today. Your physician has requested that you have an echocardiogram. Echocardiography is a painless test that uses sound waves to create images of your heart. It provides your doctor with information about the size and shape of your heart and how well your heart's chambers and valves are working. This procedure takes approximately one hour. There are no restrictions for this procedure. Office will contact with results via phone or letter.   Follow up in  3 months
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
## 2014-10-18 NOTE — Telephone Encounter (Signed)
Echo scheduled at [facility] April 26th @8 :30

## 2014-10-21 MED ORDER — APIXABAN 5 MG PO TABS: 5.0000 mg | ORAL_TABLET | Freq: Two times a day (BID) | ORAL | Status: DC

## 2014-10-21 NOTE — Addendum Note (Signed)
Addended by: Laurine Blazer on: 10/21/2014 09:45 AM   Modules accepted: Orders

## 2014-10-21 NOTE — Telephone Encounter (Signed)
No precert required 

## 2014-10-22 ENCOUNTER — Telehealth: Payer: Self-pay | Admitting: *Deleted

## 2014-10-22 ENCOUNTER — Ambulatory Visit (HOSPITAL_COMMUNITY)
Admission: RE | Admit: 2014-10-22 | Discharge: 2014-10-22 | Disposition: A | Discharge status: Home / Self Care | Payer: Medicare HMO | Source: Ambulatory Visit | Attending: Cardiology | Admitting: Cardiology

## 2014-10-22 DIAGNOSIS — I509 Heart failure, unspecified: Secondary | ICD-10-CM | POA: Diagnosis not present

## 2014-10-22 DIAGNOSIS — I10 Essential (primary) hypertension: Secondary | ICD-10-CM | POA: Insufficient documentation

## 2014-10-22 DIAGNOSIS — R931 Abnormal findings on diagnostic imaging of heart and coronary circulation: Secondary | ICD-10-CM

## 2014-10-22 DIAGNOSIS — I5022 Chronic systolic (congestive) heart failure: Secondary | ICD-10-CM

## 2014-10-22 DIAGNOSIS — E119 Type 2 diabetes mellitus without complications: Secondary | ICD-10-CM | POA: Insufficient documentation

## 2014-10-22 NOTE — Telephone Encounter (Signed)
1st available appointment in the [facility] with Dr. Lovena Le is June 29th @ 8:45 am.

## 2014-10-22 NOTE — Telephone Encounter (Signed)
-----   Message from Arnoldo Lenis, MD sent at 10/22/2014 12:36 PM EDT ----- Please let patient and wife know that echo continues to show his heart function is poor.They would not be able to get to [facility] to see Dr Rayann Heman as new patient apptointment, so please refer to Dr Lovena Le in [place] for ICD consideration.   Zandra Abts MD

## 2014-10-22 NOTE — Progress Notes (Signed)
*  PRELIMINARY RESULTS* Echocardiogram 2D Echocardiogram has been performed.  Leavy Cella 10/22/2014, 9:20 AM

## 2014-10-22 NOTE — Telephone Encounter (Signed)
Pt made aware. Forwarded to [facility]. Referral orders placed.

## 2014-10-23 NOTE — Telephone Encounter (Signed)
Pt made aware of appt. Would like reminder call closer to time of appt. Will call office with any problems prior to appt.

## 2014-11-19 ENCOUNTER — Other Ambulatory Visit: Payer: Self-pay | Admitting: Cardiology

## 2014-11-19 ENCOUNTER — Ambulatory Visit (INDEPENDENT_AMBULATORY_CARE_PROVIDER_SITE_OTHER): Payer: Medicare HMO | Admitting: *Deleted

## 2014-11-19 DIAGNOSIS — I4891 Unspecified atrial fibrillation: Secondary | ICD-10-CM | POA: Diagnosis not present

## 2014-11-19 DIAGNOSIS — Z7901 Long term (current) use of anticoagulants: Secondary | ICD-10-CM

## 2014-11-19 DIAGNOSIS — Z5181 Encounter for therapeutic drug level monitoring: Secondary | ICD-10-CM | POA: Diagnosis not present

## 2014-11-19 NOTE — Progress Notes (Signed)
Pt was started on Eliquis 5mg  bid for atrial fib on 10/18/14 by Dr Harl Bowie.  Coumadin was discontinued.    Pt has been on Eliquis x 1 month.  Tolerating well.  No s/s of increased bruising, bleeding or GI upset.  Reviewed patients medication list.  Pt is not currently on any combined P-gp and strong CYP3A4 inhibitors/inducers (ketoconazole, traconazole, ritonavir, carbamazepine, phenytoin, rifampin, St. John's wort).  Reviewed labs from 11/21/14.  SCr 0.99 , Weight 210, CrCl 86.86.  Dose is appropriate based on 2 out of 3 criteria (age, weight, SrCr). Hgb and HCT: 13.9/42.2  A full discussion of the nature of anticoagulants has been carried out.  A benefit/risk analysis has been presented to the patient, so that they understand the justification for choosing anticoagulation with Eliquis at this time.  The need for compliance is stressed.  Pt is aware to take the medication twice daily.  Side effects of potential bleeding are discussed, including unusual colored urine or stools, coughing up blood or coffee ground emesis, nose bleeds or serious fall or head trauma.  Discussed signs and symptoms of stroke. The patient should avoid any OTC items containing aspirin or ibuprofen.  Avoid alcohol consumption.   Call if any signs of abnormal bleeding.  Discussed financial obligations and resolved any difficulty in obtaining medication.  Next lab test test in 6 months.   Labs:  07/11/14  SrCr 1.05

## 2014-11-20 ENCOUNTER — Other Ambulatory Visit: Payer: Self-pay | Admitting: Cardiology

## 2014-11-21 ENCOUNTER — Telehealth: Payer: Self-pay | Admitting: *Deleted

## 2014-11-21 LAB — BASIC METABOLIC PANEL
BUN: 33 mg/dL — ABNORMAL HIGH (ref 6–23)
CO2: 28 mEq/L (ref 19–32)
Calcium: 9.3 mg/dL (ref 8.4–10.5)
Chloride: 100 mEq/L (ref 96–112)
Creat: 0.99 mg/dL (ref 0.50–1.35)
Glucose, Bld: 345 mg/dL — ABNORMAL HIGH (ref 70–99)
Potassium: 4.1 mEq/L (ref 3.5–5.3)
Sodium: 138 mEq/L (ref 135–145)

## 2014-11-21 LAB — CBC
HCT: 42.2 % (ref 39.0–52.0)
Hemoglobin: 13.9 g/dL (ref 13.0–17.0)
MCH: 31.7 pg (ref 26.0–34.0)
MCHC: 32.9 g/dL (ref 30.0–36.0)
MCV: 96.1 fL (ref 78.0–100.0)
MPV: 11.9 fL (ref 8.6–12.4)
Platelets: 284 10*3/uL (ref 150–400)
RBC: 4.39 MIL/uL (ref 4.22–5.81)
RDW: 14.4 % (ref 11.5–15.5)
WBC: 8 10*3/uL (ref 4.0–10.5)

## 2014-11-21 NOTE — Telephone Encounter (Signed)
-----   Message from Arnoldo Lenis, MD sent at 11/21/2014 11:46 AM EDT ----- Labs look good  Zandra Abts MD

## 2014-11-21 NOTE — Telephone Encounter (Signed)
Pt daughter made aware, forwarded to pcp

## 2014-12-04 ENCOUNTER — Telehealth: Payer: Self-pay | Admitting: *Deleted

## 2014-12-04 NOTE — Telephone Encounter (Signed)
INR standing order renewed and faxed to [place].

## 2014-12-17 IMAGING — CR DG HAND COMPLETE 3+V*R*
3 series · 3 of 3 positions shown · non-contrast
Comparison: None.

CLINICAL DATA: Right hand pain and swelling.  No known injury.

EXAM:
RIGHT HAND - COMPLETE 3+ VIEW

[view not recorded (1 of 3)]
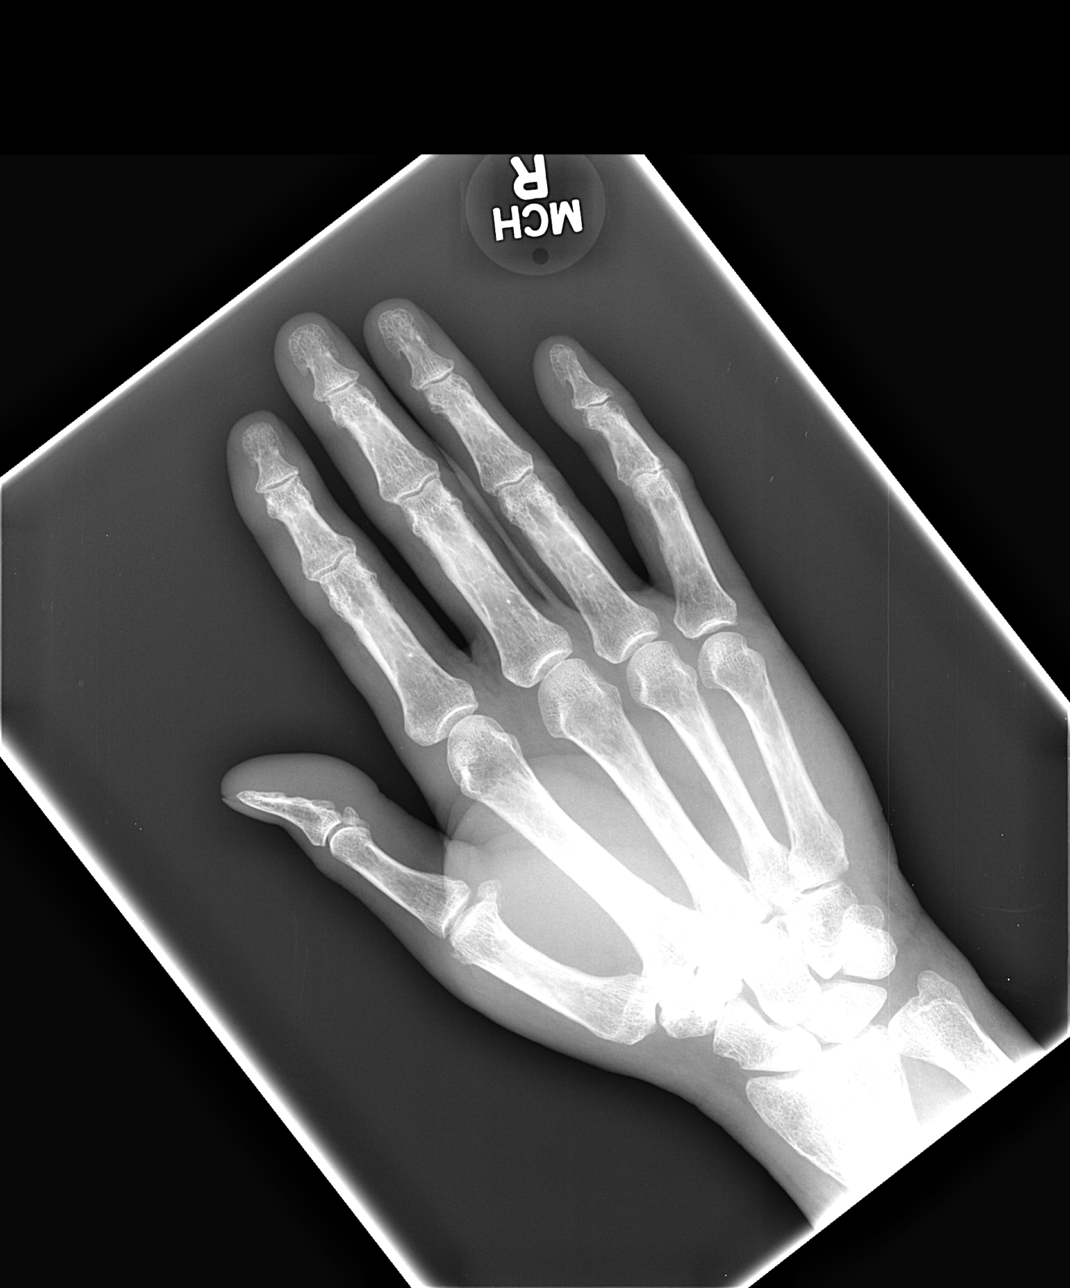

[view not recorded (2 of 3)]
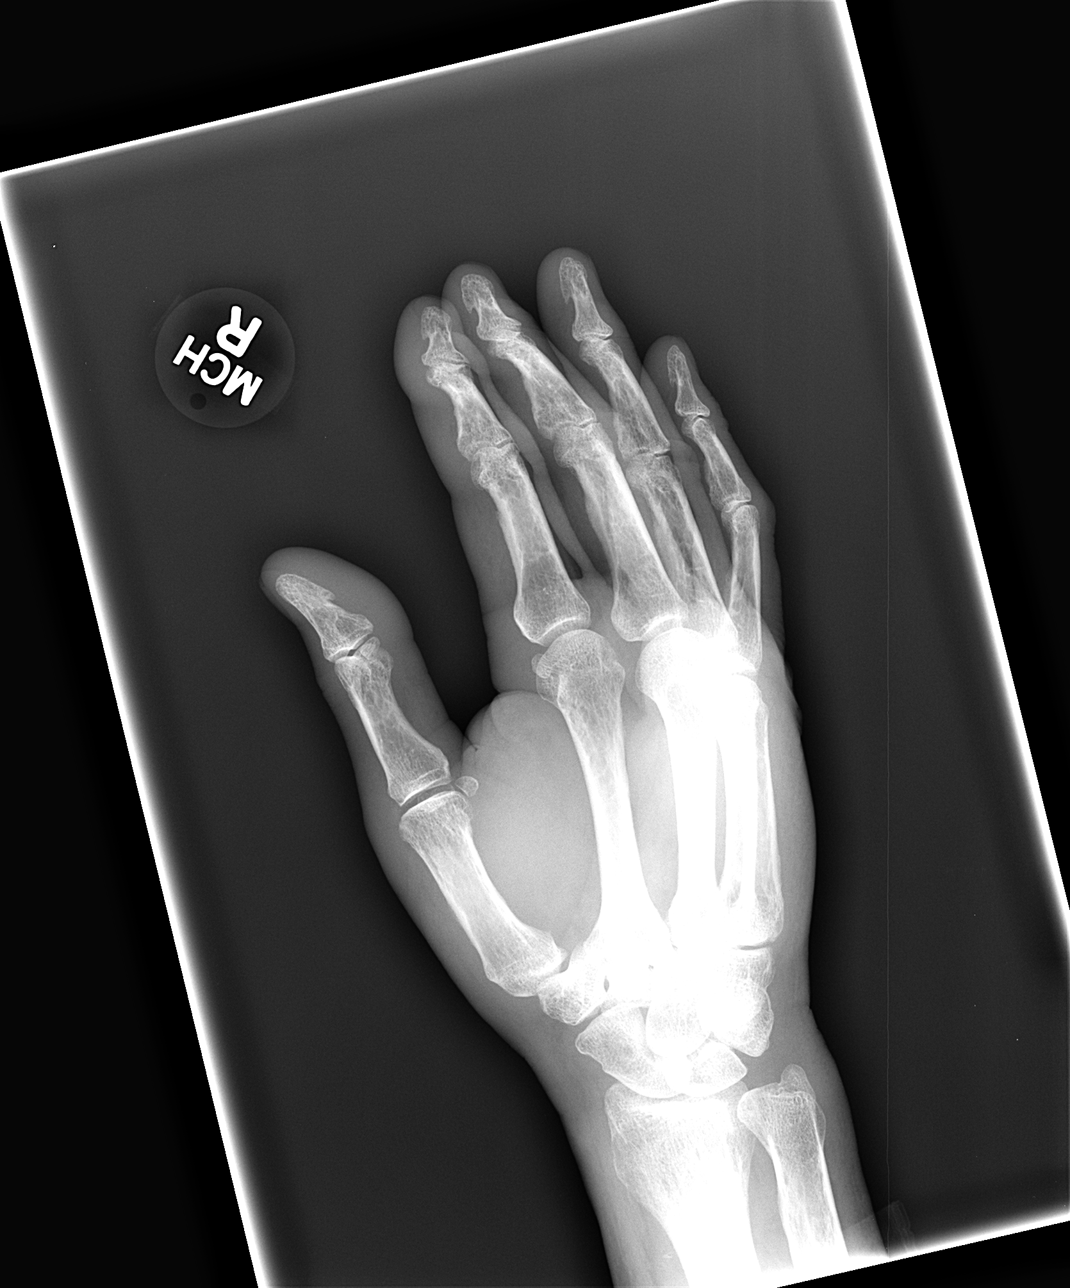

[view not recorded (3 of 3)]
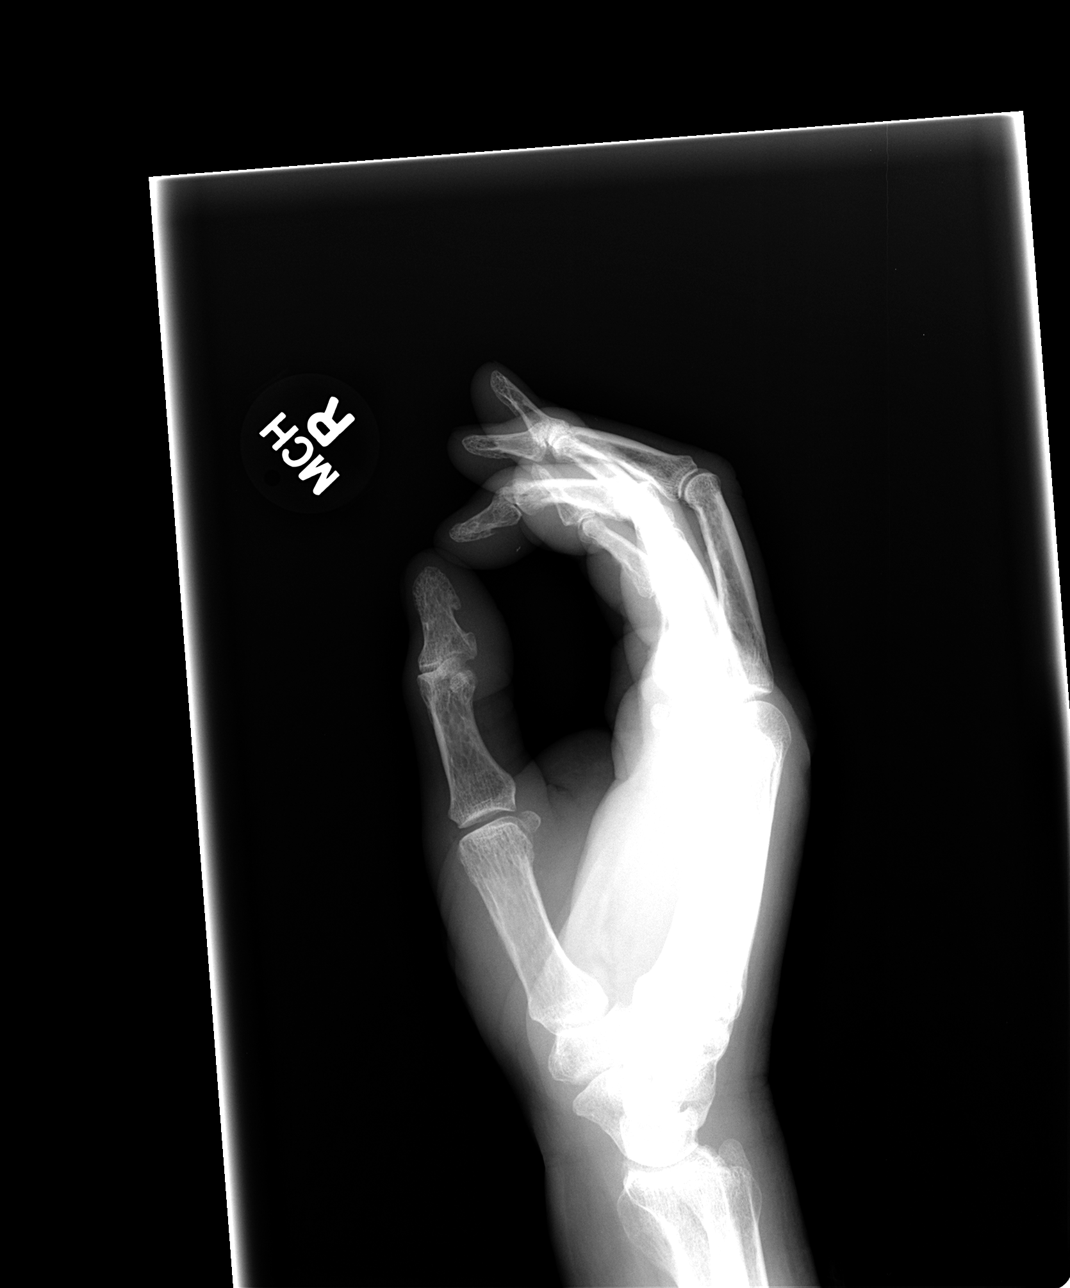

[3 of 3 positions shown; findings below may reference images not displayed]

FINDINGS: No fracture. No dislocation. There is minor asymmetric joint space
narrowing of several distal interphalangeal joints most evident the
fifth DIP joint. Mild soft tissue swelling is noted most evident of
the distal index finger. No soft tissue ossification or
calcification no discrete periarticular erosion.

Bones are demineralized.
IMPRESSION: 1. No fracture or dislocation.
2. Mild arthropathic change of several distal interphalangeal
joints, most suggestive of osteoarthritis. No periarticular
erosions. No convincing inflammatory arthritis.

## 2014-12-23 ENCOUNTER — Encounter: Payer: Self-pay | Admitting: Internal Medicine

## 2014-12-23 ENCOUNTER — Ambulatory Visit (INDEPENDENT_AMBULATORY_CARE_PROVIDER_SITE_OTHER): Payer: Medicare HMO | Admitting: Internal Medicine

## 2014-12-23 VITALS — BP 138/68 | HR 85 | Ht 71.0 in | Wt 206.2 lb

## 2014-12-23 DIAGNOSIS — I5022 Chronic systolic (congestive) heart failure: Secondary | ICD-10-CM | POA: Diagnosis not present

## 2014-12-23 DIAGNOSIS — I4821 Permanent atrial fibrillation: Secondary | ICD-10-CM

## 2014-12-23 DIAGNOSIS — I482 Chronic atrial fibrillation: Secondary | ICD-10-CM | POA: Diagnosis not present

## 2014-12-23 DIAGNOSIS — I10 Essential (primary) hypertension: Secondary | ICD-10-CM | POA: Diagnosis not present

## 2014-12-23 DIAGNOSIS — Z136 Encounter for screening for cardiovascular disorders: Secondary | ICD-10-CM

## 2014-12-23 NOTE — Assessment & Plan Note (Signed)
His ventricular rate is not well controlled. He may require uptitration of his medications. I'll defer to Dr. Harl Bowie.

## 2014-12-23 NOTE — Assessment & Plan Note (Signed)
His blood pressure is a little high today. No change in medications. He is encouraged to reduce his sodium intake. He might also do better with uptitration of his beta blocker.
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
## 2014-12-23 NOTE — Patient Instructions (Signed)
Your physician recommends that you schedule a follow-up appointment in: As needed  Your physician recommends that you continue on your current medications as directed. Please refer to the Current Medication list given to you today.  Thank you for choosing [facility]!

## 2014-12-23 NOTE — Assessment & Plan Note (Signed)
At this point, his symptoms are class 1 despite LV dysfunction which persists. As he is essentially asymptomatic on maximal medical therapy, I have recommended that he undergo watchful waiting with regard to ICD implant. If his heart failure symptoms were to worsen, then we would reconsider implant.

## 2015-01-02 ENCOUNTER — Encounter: Payer: Self-pay | Admitting: Cardiology

## 2015-01-02 ENCOUNTER — Ambulatory Visit (INDEPENDENT_AMBULATORY_CARE_PROVIDER_SITE_OTHER): Payer: Medicare HMO | Admitting: Cardiology

## 2015-01-02 VITALS — BP 125/72 | HR 61 | Ht 70.0 in | Wt 209.0 lb

## 2015-01-02 DIAGNOSIS — Z7901 Long term (current) use of anticoagulants: Secondary | ICD-10-CM

## 2015-01-02 DIAGNOSIS — I482 Chronic atrial fibrillation: Secondary | ICD-10-CM | POA: Diagnosis not present

## 2015-01-02 DIAGNOSIS — I10 Essential (primary) hypertension: Secondary | ICD-10-CM

## 2015-01-02 DIAGNOSIS — I4891 Unspecified atrial fibrillation: Secondary | ICD-10-CM | POA: Diagnosis not present

## 2015-01-02 DIAGNOSIS — I5022 Chronic systolic (congestive) heart failure: Secondary | ICD-10-CM

## 2015-01-02 MED ORDER — APIXABAN 5 MG PO TABS: 5.0000 mg | ORAL_TABLET | Freq: Two times a day (BID) | ORAL | Status: DC

## 2015-01-02 MED ORDER — CARVEDILOL 25 MG PO TABS: 37.5000 mg | ORAL_TABLET | Freq: Two times a day (BID) | ORAL | Status: DC
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
## 2015-01-02 NOTE — Patient Instructions (Signed)
Your physician has recommended you make the following change in your medication:  Increase your carvedilol to 37.5 mg twice daily. Please take 1&1/2 tablets twice daily of your 25 mg tablet. Continue all other medications the same. Your physician recommends that you schedule a follow-up appointment in: 3-4 weeks.

## 2015-01-06 ENCOUNTER — Other Ambulatory Visit: Payer: Self-pay | Admitting: *Deleted

## 2015-01-06 MED ORDER — APIXABAN 5 MG PO TABS: 5.0000 mg | ORAL_TABLET | Freq: Two times a day (BID) | ORAL | Status: DC

## 2015-01-15 ENCOUNTER — Other Ambulatory Visit: Payer: Self-pay | Admitting: *Deleted

## 2015-01-15 MED ORDER — APIXABAN 5 MG PO TABS: 5.0000 mg | ORAL_TABLET | Freq: Two times a day (BID) | ORAL | Status: DC

## 2015-01-20 ENCOUNTER — Telehealth: Payer: Self-pay | Admitting: *Deleted

## 2015-01-20 NOTE — Telephone Encounter (Signed)
Pt was approved for Eliquis thru 06/28/15 from [employer] patience assistance program

## 2015-01-24 ENCOUNTER — Ambulatory Visit (INDEPENDENT_AMBULATORY_CARE_PROVIDER_SITE_OTHER): Payer: Medicare HMO | Admitting: Cardiology

## 2015-01-24 ENCOUNTER — Encounter: Payer: Self-pay | Admitting: Cardiology

## 2015-01-24 VITALS — BP 130/75 | HR 87 | Ht 70.0 in | Wt 210.4 lb

## 2015-01-24 DIAGNOSIS — I482 Chronic atrial fibrillation: Secondary | ICD-10-CM | POA: Diagnosis not present

## 2015-01-24 DIAGNOSIS — I4891 Unspecified atrial fibrillation: Secondary | ICD-10-CM

## 2015-01-24 NOTE — Patient Instructions (Signed)
Your physician wants you to follow-up in: 3 months with Dr. Branch You will receive a reminder letter in the mail two months in advance. If you don't receive a letter, please call our office to schedule the follow-up appointment.  Your physician recommends that you continue on your current medications as directed. Please refer to the Current Medication list given to you today.  Thank you for choosing [facility]!!    
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
## 2015-03-25 ENCOUNTER — Other Ambulatory Visit: Payer: Self-pay | Admitting: Cardiology

## 2015-04-21 DIAGNOSIS — Z23 Encounter for immunization: Secondary | ICD-10-CM | POA: Diagnosis not present

## 2015-04-23 ENCOUNTER — Other Ambulatory Visit: Payer: Self-pay | Admitting: Cardiology

## 2015-04-24 ENCOUNTER — Ambulatory Visit (INDEPENDENT_AMBULATORY_CARE_PROVIDER_SITE_OTHER): Payer: Medicare HMO | Admitting: Cardiology

## 2015-04-24 ENCOUNTER — Encounter: Payer: Self-pay | Admitting: Cardiology

## 2015-04-24 VITALS — BP 105/64 | HR 60 | Ht 70.0 in | Wt 204.0 lb

## 2015-04-24 DIAGNOSIS — I10 Essential (primary) hypertension: Secondary | ICD-10-CM | POA: Diagnosis not present

## 2015-04-24 DIAGNOSIS — I5022 Chronic systolic (congestive) heart failure: Secondary | ICD-10-CM | POA: Diagnosis not present

## 2015-04-24 DIAGNOSIS — I4891 Unspecified atrial fibrillation: Secondary | ICD-10-CM

## 2015-04-24 MED ORDER — DIGOXIN 125 MCG PO TABS: 0.1250 mg | ORAL_TABLET | Freq: Every day | ORAL | Status: DC
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
## 2015-04-24 NOTE — Patient Instructions (Signed)
Your physician recommends that you schedule a follow-up appointment in: 3 MONTHS WITH DR. Good Hope  Your physician has recommended you make the following change in your medication:   START DIGOXIN 0.125 MG DAILY  Thank you for choosing [facility]!!

## 2015-05-07 DIAGNOSIS — I10 Essential (primary) hypertension: Secondary | ICD-10-CM | POA: Diagnosis not present

## 2015-05-07 DIAGNOSIS — E1165 Type 2 diabetes mellitus with hyperglycemia: Secondary | ICD-10-CM | POA: Diagnosis not present

## 2015-05-07 DIAGNOSIS — M109 Gout, unspecified: Secondary | ICD-10-CM | POA: Diagnosis not present

## 2015-05-07 DIAGNOSIS — Z7901 Long term (current) use of anticoagulants: Secondary | ICD-10-CM | POA: Diagnosis not present

## 2015-05-27 ENCOUNTER — Ambulatory Visit (INDEPENDENT_AMBULATORY_CARE_PROVIDER_SITE_OTHER): Payer: Medicare HMO | Admitting: *Deleted

## 2015-05-27 DIAGNOSIS — Z5181 Encounter for therapeutic drug level monitoring: Secondary | ICD-10-CM

## 2015-05-27 DIAGNOSIS — I4891 Unspecified atrial fibrillation: Secondary | ICD-10-CM | POA: Diagnosis not present

## 2015-05-27 DIAGNOSIS — I480 Paroxysmal atrial fibrillation: Secondary | ICD-10-CM

## 2015-05-27 NOTE — Progress Notes (Signed)
Pt was started on Eliquis 5mg  bid for atrial fib on 10/18/14 by Dr Harl Bowie.    Pt states he is tolerating Eliquis well. No s/s of increased bruising, bleeding or GI upset.  Reviewed patients medication list. Pt is not currently on any combined P-gp and strong CYP3A4 inhibitors/inducers (ketoconazole, traconazole, ritonavir, carbamazepine, phenytoin, rifampin, St. John's wort). Reviewed labs from 05/27/15 @ [facility] SCr 1.01, Weight 200.4, CrCl 79.84 . Dose is appropriate based on 2 out of 3 criteria (age, weight, SrCr). Hgb and HCT: 13.1/37.7  A full discussion of the nature of anticoagulants has been carried out. A benefit/risk analysis has been presented to the patient, so that they understand the justification for choosing anticoagulation with Eliquis at this time. The need for compliance is stressed. Pt is aware to take the medication twice daily. Side effects of potential bleeding are discussed, including unusual colored urine or stools, coughing up blood or coffee ground emesis, nose bleeds or serious fall or head trauma. Discussed signs and symptoms of stroke. The patient should avoid any OTC items containing aspirin or ibuprofen. Avoid alcohol consumption. Call if any signs of abnormal bleeding. Discussed financial obligations and resolved any difficulty in obtaining medication. Next lab test test in 6 months.   Pt notified.  F/U in 6 months

## 2015-05-28 LAB — BASIC METABOLIC PANEL
BUN: 20 mg/dL (ref 7–25)
CHLORIDE: 101 mmol/L (ref 98–110)
CO2: 28 mmol/L (ref 20–31)
Calcium: 9.7 mg/dL (ref 8.6–10.3)
Creat: 1.01 mg/dL (ref 0.70–1.18)
GLUCOSE: 210 mg/dL — AB (ref 65–99)
POTASSIUM: 3.5 mmol/L (ref 3.5–5.3)
SODIUM: 140 mmol/L (ref 135–146)

## 2015-05-28 LAB — CBC
HEMATOCRIT: 37.7 % — AB (ref 39.0–52.0)
HEMOGLOBIN: 13.1 g/dL (ref 13.0–17.0)
MCH: 32 pg (ref 26.0–34.0)
MCHC: 34.7 g/dL (ref 30.0–36.0)
MCV: 92 fL (ref 78.0–100.0)
MPV: 11.8 fL (ref 8.6–12.4)
Platelets: 302 10*3/uL (ref 150–400)
RBC: 4.1 MIL/uL — AB (ref 4.22–5.81)
RDW: 14.6 % (ref 11.5–15.5)
WBC: 9.1 10*3/uL (ref 4.0–10.5)

## 2015-05-30 ENCOUNTER — Telehealth: Payer: Self-pay | Admitting: *Deleted

## 2015-05-30 NOTE — Telephone Encounter (Signed)
-----   Message from Arnoldo Lenis, MD sent at 05/29/2015 10:14 AM EST ----- Labs look good  Zandra Abts MD

## 2015-05-30 NOTE — Telephone Encounter (Signed)
Pt aware, routed to pcp 

## 2015-06-03 ENCOUNTER — Other Ambulatory Visit: Payer: Self-pay | Admitting: *Deleted

## 2015-06-03 MED ORDER — APIXABAN 5 MG PO TABS: 5.0000 mg | ORAL_TABLET | Freq: Two times a day (BID) | ORAL | Status: DC

## 2015-06-14 DIAGNOSIS — R69 Illness, unspecified: Secondary | ICD-10-CM | POA: Diagnosis not present

## 2015-06-24 DIAGNOSIS — E1165 Type 2 diabetes mellitus with hyperglycemia: Secondary | ICD-10-CM | POA: Diagnosis not present

## 2015-06-24 DIAGNOSIS — R69 Illness, unspecified: Secondary | ICD-10-CM | POA: Diagnosis not present

## 2015-06-25 DIAGNOSIS — R69 Illness, unspecified: Secondary | ICD-10-CM | POA: Diagnosis not present

## 2015-07-25 DIAGNOSIS — R69 Illness, unspecified: Secondary | ICD-10-CM | POA: Diagnosis not present

## 2015-07-29 ENCOUNTER — Encounter: Payer: Self-pay | Admitting: Cardiology

## 2015-07-29 ENCOUNTER — Ambulatory Visit (INDEPENDENT_AMBULATORY_CARE_PROVIDER_SITE_OTHER): Payer: Medicare HMO | Admitting: Cardiology

## 2015-07-29 VITALS — BP 116/68 | HR 60 | Ht 70.0 in | Wt 206.0 lb

## 2015-07-29 DIAGNOSIS — Z7901 Long term (current) use of anticoagulants: Secondary | ICD-10-CM | POA: Diagnosis not present

## 2015-07-29 DIAGNOSIS — I10 Essential (primary) hypertension: Secondary | ICD-10-CM

## 2015-07-29 DIAGNOSIS — I5022 Chronic systolic (congestive) heart failure: Secondary | ICD-10-CM

## 2015-07-29 DIAGNOSIS — I480 Paroxysmal atrial fibrillation: Secondary | ICD-10-CM

## 2015-07-29 MED ORDER — APIXABAN 5 MG PO TABS: 5.0000 mg | ORAL_TABLET | Freq: Two times a day (BID) | ORAL | Status: DC

## 2015-07-29 MED ORDER — FUROSEMIDE 40 MG PO TABS: ORAL_TABLET | ORAL | Status: DC

## 2015-07-29 MED ORDER — CARVEDILOL 25 MG PO TABS: 25.0000 mg | ORAL_TABLET | Freq: Two times a day (BID) | ORAL | Status: DC

## 2015-07-29 NOTE — Patient Instructions (Signed)
   Decrease Coreg to 25mg  twice a day  - new sent to [facility] today.   Change your Lasix to 60mg  every morning (1 1/2 tabs) & 40mg  (1 tab) every evening. Continue all other medications.   Your physician wants you to follow up in: 6 months.  You will receive a reminder letter in the mail one-two months in advance.  If you don't receive a letter, please call our office to schedule the follow up appointment
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
## 2015-07-30 ENCOUNTER — Encounter: Payer: Self-pay | Admitting: *Deleted

## 2015-08-11 DIAGNOSIS — I482 Chronic atrial fibrillation: Secondary | ICD-10-CM | POA: Diagnosis not present

## 2015-08-11 DIAGNOSIS — J441 Chronic obstructive pulmonary disease with (acute) exacerbation: Secondary | ICD-10-CM | POA: Diagnosis not present

## 2015-08-11 DIAGNOSIS — Z7901 Long term (current) use of anticoagulants: Secondary | ICD-10-CM | POA: Diagnosis not present

## 2015-08-11 DIAGNOSIS — J069 Acute upper respiratory infection, unspecified: Secondary | ICD-10-CM | POA: Diagnosis not present

## 2015-08-18 DIAGNOSIS — R69 Illness, unspecified: Secondary | ICD-10-CM | POA: Diagnosis not present

## 2015-08-20 ENCOUNTER — Emergency Department (HOSPITAL_COMMUNITY): Payer: Medicare HMO

## 2015-08-20 ENCOUNTER — Observation Stay (HOSPITAL_COMMUNITY)
Admission: EM | Admit: 2015-08-20 | Discharge: 2015-08-21 | Disposition: A | Discharge status: Home / Self Care | Payer: Medicare HMO | Attending: Internal Medicine | Admitting: Internal Medicine

## 2015-08-20 ENCOUNTER — Encounter (HOSPITAL_COMMUNITY): Payer: Self-pay

## 2015-08-20 DIAGNOSIS — H9190 Unspecified hearing loss, unspecified ear: Secondary | ICD-10-CM | POA: Insufficient documentation

## 2015-08-20 DIAGNOSIS — N289 Disorder of kidney and ureter, unspecified: Secondary | ICD-10-CM | POA: Diagnosis not present

## 2015-08-20 DIAGNOSIS — I10 Essential (primary) hypertension: Secondary | ICD-10-CM | POA: Diagnosis not present

## 2015-08-20 DIAGNOSIS — E785 Hyperlipidemia, unspecified: Secondary | ICD-10-CM | POA: Insufficient documentation

## 2015-08-20 DIAGNOSIS — R05 Cough: Secondary | ICD-10-CM | POA: Diagnosis not present

## 2015-08-20 DIAGNOSIS — I5022 Chronic systolic (congestive) heart failure: Secondary | ICD-10-CM | POA: Diagnosis not present

## 2015-08-20 DIAGNOSIS — J3489 Other specified disorders of nose and nasal sinuses: Secondary | ICD-10-CM | POA: Insufficient documentation

## 2015-08-20 DIAGNOSIS — Z792 Long term (current) use of antibiotics: Secondary | ICD-10-CM | POA: Diagnosis not present

## 2015-08-20 DIAGNOSIS — I4891 Unspecified atrial fibrillation: Secondary | ICD-10-CM | POA: Insufficient documentation

## 2015-08-20 DIAGNOSIS — I509 Heart failure, unspecified: Secondary | ICD-10-CM | POA: Diagnosis not present

## 2015-08-20 DIAGNOSIS — R059 Cough, unspecified: Secondary | ICD-10-CM

## 2015-08-20 DIAGNOSIS — Z79899 Other long term (current) drug therapy: Secondary | ICD-10-CM | POA: Insufficient documentation

## 2015-08-20 DIAGNOSIS — R61 Generalized hyperhidrosis: Secondary | ICD-10-CM | POA: Diagnosis not present

## 2015-08-20 DIAGNOSIS — Q211 Atrial septal defect: Secondary | ICD-10-CM | POA: Insufficient documentation

## 2015-08-20 DIAGNOSIS — E876 Hypokalemia: Secondary | ICD-10-CM | POA: Insufficient documentation

## 2015-08-20 DIAGNOSIS — I4821 Permanent atrial fibrillation: Secondary | ICD-10-CM | POA: Diagnosis present

## 2015-08-20 DIAGNOSIS — R531 Weakness: Secondary | ICD-10-CM | POA: Insufficient documentation

## 2015-08-20 DIAGNOSIS — E538 Deficiency of other specified B group vitamins: Secondary | ICD-10-CM | POA: Diagnosis not present

## 2015-08-20 DIAGNOSIS — N179 Acute kidney failure, unspecified: Secondary | ICD-10-CM | POA: Insufficient documentation

## 2015-08-20 DIAGNOSIS — H5000 Unspecified esotropia: Secondary | ICD-10-CM | POA: Diagnosis not present

## 2015-08-20 DIAGNOSIS — E162 Hypoglycemia, unspecified: Secondary | ICD-10-CM | POA: Diagnosis not present

## 2015-08-20 DIAGNOSIS — R9431 Abnormal electrocardiogram [ECG] [EKG]: Secondary | ICD-10-CM | POA: Diagnosis not present

## 2015-08-20 DIAGNOSIS — Z7984 Long term (current) use of oral hypoglycemic drugs: Secondary | ICD-10-CM | POA: Insufficient documentation

## 2015-08-20 DIAGNOSIS — D509 Iron deficiency anemia, unspecified: Secondary | ICD-10-CM | POA: Diagnosis not present

## 2015-08-20 DIAGNOSIS — E11649 Type 2 diabetes mellitus with hypoglycemia without coma: Principal | ICD-10-CM | POA: Insufficient documentation

## 2015-08-20 DIAGNOSIS — E860 Dehydration: Secondary | ICD-10-CM | POA: Insufficient documentation

## 2015-08-20 DIAGNOSIS — I428 Other cardiomyopathies: Secondary | ICD-10-CM

## 2015-08-20 DIAGNOSIS — I429 Cardiomyopathy, unspecified: Secondary | ICD-10-CM

## 2015-08-20 DIAGNOSIS — E119 Type 2 diabetes mellitus without complications: Secondary | ICD-10-CM | POA: Diagnosis not present

## 2015-08-20 DIAGNOSIS — R0602 Shortness of breath: Secondary | ICD-10-CM | POA: Diagnosis not present

## 2015-08-20 LAB — COMPREHENSIVE METABOLIC PANEL
ALBUMIN: 3.6 g/dL (ref 3.5–5.0)
ALK PHOS: 47 U/L (ref 38–126)
ALT: 11 U/L — ABNORMAL LOW (ref 17–63)
ANION GAP: 13 (ref 5–15)
AST: 19 U/L (ref 15–41)
BILIRUBIN TOTAL: 0.3 mg/dL (ref 0.3–1.2)
BUN: 40 mg/dL — ABNORMAL HIGH (ref 6–20)
CALCIUM: 9.3 mg/dL (ref 8.9–10.3)
CO2: 26 mmol/L (ref 22–32)
Chloride: 101 mmol/L (ref 101–111)
Creatinine, Ser: 1.77 mg/dL — ABNORMAL HIGH (ref 0.61–1.24)
GFR calc Af Amer: 41 mL/min — ABNORMAL LOW (ref >60)
GFR, EST NON AFRICAN AMERICAN: 36 mL/min — AB (ref >60)
GLUCOSE: 71 mg/dL (ref 65–99)
POTASSIUM: 3 mmol/L — AB (ref 3.5–5.1)
Sodium: 140 mmol/L (ref 135–145)
TOTAL PROTEIN: 7.5 g/dL (ref 6.5–8.1)

## 2015-08-20 LAB — CBC WITH DIFFERENTIAL/PLATELET
BASOS PCT: 0 %
Basophils Absolute: 0 10*3/uL (ref 0.0–0.1)
Eosinophils Absolute: 0 10*3/uL (ref 0.0–0.7)
Eosinophils Relative: 0 %
HEMATOCRIT: 38.8 % — AB (ref 39.0–52.0)
HEMOGLOBIN: 13.2 g/dL (ref 13.0–17.0)
LYMPHS ABS: 2.8 10*3/uL (ref 0.7–4.0)
LYMPHS PCT: 20 %
MCH: 32.8 pg (ref 26.0–34.0)
MCHC: 34 g/dL (ref 30.0–36.0)
MCV: 96.3 fL (ref 78.0–100.0)
MONO ABS: 0.9 10*3/uL (ref 0.1–1.0)
MONOS PCT: 6 %
NEUTROS ABS: 10.6 10*3/uL — AB (ref 1.7–7.7)
NEUTROS PCT: 74 %
Platelets: 385 10*3/uL (ref 150–400)
RBC: 4.03 MIL/uL — ABNORMAL LOW (ref 4.22–5.81)
RDW: 14.4 % (ref 11.5–15.5)
WBC: 14.4 10*3/uL — ABNORMAL HIGH (ref 4.0–10.5)

## 2015-08-20 LAB — CBG MONITORING, ED
GLUCOSE-CAPILLARY: 66 mg/dL (ref 65–99)
GLUCOSE-CAPILLARY: 32 mg/dL — AB (ref 65–99)
Glucose-Capillary: 110 mg/dL — ABNORMAL HIGH (ref 65–99)
Glucose-Capillary: 125 mg/dL — ABNORMAL HIGH (ref 65–99)

## 2015-08-20 LAB — URINALYSIS, ROUTINE W REFLEX MICROSCOPIC
BILIRUBIN URINE: NEGATIVE
GLUCOSE, UA: 100 mg/dL — AB
Hgb urine dipstick: NEGATIVE
KETONES UR: NEGATIVE mg/dL
Leukocytes, UA: NEGATIVE
NITRITE: NEGATIVE
PH: 5.5 (ref 5.0–8.0)
PROTEIN: NEGATIVE mg/dL
Specific Gravity, Urine: 1.02 (ref 1.005–1.030)

## 2015-08-20 LAB — TROPONIN I: Troponin I: <0.03 ng/mL (ref <0.031)

## 2015-08-20 LAB — TSH: TSH: 0.993 u[IU]/mL (ref 0.350–4.500)

## 2015-08-20 LAB — LIPASE, BLOOD: LIPASE: 38 U/L (ref 11–51)

## 2015-08-20 MED ORDER — DEXTROSE 50 % IV SOLN
INTRAVENOUS | Status: AC
  Filled 2015-08-20: qty 50

## 2015-08-20 MED ORDER — DEXTROSE 50 % IV SOLN
1.0000 | Freq: Once | INTRAVENOUS | Status: AC
  Administered 2015-08-20: 50 mL via INTRAVENOUS

## 2015-08-20 MED ORDER — DEXTROSE 5 % IV SOLN
Freq: Once | INTRAVENOUS | Status: DC
Start: 2015-08-20 — End: 2015-08-20

## 2015-08-20 MED ORDER — DEXTROSE-NACL 5-0.9 % IV SOLN
INTRAVENOUS | Status: DC
  Administered 2015-08-20: 22:00:00 via INTRAVENOUS

## 2015-08-20 MED ORDER — DEXTROSE 10 % IV SOLN: Freq: Once | INTRAVENOUS | Status: DC

## 2015-08-20 MED ORDER — DEXTROSE 10 % IV SOLN
Freq: Once | INTRAVENOUS | Status: AC
  Administered 2015-08-20: via INTRAVENOUS
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
## 2015-08-20 NOTE — ED Notes (Signed)
Before pt was transferred to floor his blood sugar was taken and it was 32. 1 amp of d50 was given.

## 2015-08-20 NOTE — ED Notes (Addendum)
cbg on arrival 19
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
## 2015-08-20 NOTE — ED Notes (Signed)
Pt arrives from [facility] in [place] via ems for hypoglycemia.  Per EMS,  Pt's blood sugar has been uncontrollably low all day despite several snacks and drinks etc.  Ems gave 1 amp d50 en route with the last cbg 98 after d50

## 2015-08-21 ENCOUNTER — Encounter (HOSPITAL_COMMUNITY): Payer: Self-pay | Admitting: *Deleted

## 2015-08-21 DIAGNOSIS — E876 Hypokalemia: Secondary | ICD-10-CM | POA: Insufficient documentation

## 2015-08-21 DIAGNOSIS — E860 Dehydration: Secondary | ICD-10-CM | POA: Insufficient documentation

## 2015-08-21 DIAGNOSIS — E162 Hypoglycemia, unspecified: Secondary | ICD-10-CM

## 2015-08-21 DIAGNOSIS — I5022 Chronic systolic (congestive) heart failure: Secondary | ICD-10-CM | POA: Diagnosis not present

## 2015-08-21 DIAGNOSIS — I482 Chronic atrial fibrillation: Secondary | ICD-10-CM

## 2015-08-21 DIAGNOSIS — N179 Acute kidney failure, unspecified: Secondary | ICD-10-CM

## 2015-08-21 LAB — CBC
HCT: 37.2 % — ABNORMAL LOW (ref 39.0–52.0)
Hemoglobin: 12.5 g/dL — ABNORMAL LOW (ref 13.0–17.0)
MCH: 32.6 pg (ref 26.0–34.0)
MCHC: 33.6 g/dL (ref 30.0–36.0)
MCV: 96.9 fL (ref 78.0–100.0)
PLATELETS: 349 10*3/uL (ref 150–400)
RBC: 3.84 MIL/uL — AB (ref 4.22–5.81)
RDW: 14.5 % (ref 11.5–15.5)
WBC: 12.3 10*3/uL — AB (ref 4.0–10.5)

## 2015-08-21 LAB — GLUCOSE, CAPILLARY
GLUCOSE-CAPILLARY: 99 mg/dL (ref 65–99)
GLUCOSE-CAPILLARY: 192 mg/dL — AB (ref 65–99)
Glucose-Capillary: 114 mg/dL — ABNORMAL HIGH (ref 65–99)
Glucose-Capillary: 103 mg/dL — ABNORMAL HIGH (ref 65–99)

## 2015-08-21 LAB — BASIC METABOLIC PANEL
Anion gap: 11 (ref 5–15)
BUN: 32 mg/dL — AB (ref 6–20)
CALCIUM: 8.8 mg/dL — AB (ref 8.9–10.3)
CO2: 28 mmol/L (ref 22–32)
CREATININE: 1.29 mg/dL — AB (ref 0.61–1.24)
Chloride: 102 mmol/L (ref 101–111)
GFR, EST NON AFRICAN AMERICAN: 52 mL/min — AB (ref >60)
Glucose, Bld: 139 mg/dL — ABNORMAL HIGH (ref 65–99)
Potassium: 2.9 mmol/L — ABNORMAL LOW (ref 3.5–5.1)
SODIUM: 141 mmol/L (ref 135–145)

## 2015-08-21 LAB — MAGNESIUM: MAGNESIUM: 1.5 mg/dL — AB (ref 1.7–2.4)

## 2015-08-21 LAB — DIGOXIN LEVEL: DIGOXIN LVL: 0.5 ng/mL — AB (ref 0.8–2.0)

## 2015-08-21 LAB — TROPONIN I: Troponin I: <0.03 ng/mL (ref <0.031)

## 2015-08-21 LAB — LACTIC ACID, PLASMA: Lactic Acid, Venous: 1.8 mmol/L (ref 0.5–2.0)

## 2015-08-21 MED ORDER — ONDANSETRON HCL 4 MG PO TABS: 4.0000 mg | ORAL_TABLET | Freq: Four times a day (QID) | ORAL | Status: DC | PRN

## 2015-08-21 MED ORDER — POTASSIUM CHLORIDE CRYS ER 20 MEQ PO TBCR
40.0000 meq | EXTENDED_RELEASE_TABLET | Freq: Two times a day (BID) | ORAL | Status: DC
  Administered 2015-08-21 (×2): 40 meq via ORAL
  Filled 2015-08-21 (×2): qty 2

## 2015-08-21 MED ORDER — DIGOXIN 125 MCG PO TABS
0.1250 mg | ORAL_TABLET | Freq: Every day | ORAL | Status: DC
  Administered 2015-08-21: 0.125 mg via ORAL
  Filled 2015-08-21: qty 1

## 2015-08-21 MED ORDER — APIXABAN 5 MG PO TABS
5.0000 mg | ORAL_TABLET | Freq: Two times a day (BID) | ORAL | Status: DC
  Administered 2015-08-21 (×2): 5 mg via ORAL
  Filled 2015-08-21 (×2): qty 1

## 2015-08-21 MED ORDER — ONDANSETRON HCL 4 MG/2ML IJ SOLN
4.0000 mg | Freq: Four times a day (QID) | INTRAMUSCULAR | Status: DC | PRN
Start: 2015-08-21 — End: 2015-08-21

## 2015-08-21 MED ORDER — SODIUM CHLORIDE 0.9% FLUSH
3.0000 mL | Freq: Two times a day (BID) | INTRAVENOUS | Status: DC
  Administered 2015-08-21: 3 mL via INTRAVENOUS

## 2015-08-21 MED ORDER — GEMFIBROZIL 600 MG PO TABS
600.0000 mg | ORAL_TABLET | Freq: Two times a day (BID) | ORAL | Status: DC
  Administered 2015-08-21: 600 mg via ORAL
  Filled 2015-08-21: qty 1

## 2015-08-21 MED ORDER — LOSARTAN POTASSIUM 100 MG PO TABS: 100.0000 mg | ORAL_TABLET | Freq: Every day | ORAL | Status: DC

## 2015-08-21 MED ORDER — FUROSEMIDE 40 MG PO TABS: 20.0000 mg | ORAL_TABLET | Freq: Every day | ORAL | Status: DC

## 2015-08-21 MED ORDER — KCL IN DEXTROSE-NACL 20-5-0.9 MEQ/L-%-% IV SOLN
INTRAVENOUS | Status: AC
  Administered 2015-08-21: 11:00:00 via INTRAVENOUS

## 2015-08-21 MED ORDER — METFORMIN HCL ER 500 MG PO TB24: 1000.0000 mg | ORAL_TABLET | Freq: Two times a day (BID) | ORAL | Status: DC

## 2015-08-21 MED ORDER — ACETAMINOPHEN 650 MG RE SUPP: 650.0000 mg | Freq: Four times a day (QID) | RECTAL | Status: DC | PRN

## 2015-08-21 MED ORDER — POTASSIUM CHLORIDE CRYS ER 20 MEQ PO TBCR: 20.0000 meq | EXTENDED_RELEASE_TABLET | Freq: Every day | ORAL | Status: DC

## 2015-08-21 MED ORDER — ACETAMINOPHEN 325 MG PO TABS: 650.0000 mg | ORAL_TABLET | Freq: Four times a day (QID) | ORAL | Status: DC | PRN

## 2015-08-21 MED ORDER — LIDOCAINE 5 % EX PTCH
1.0000 | MEDICATED_PATCH | CUTANEOUS | Status: DC
  Filled 2015-08-21 (×2): qty 1

## 2015-08-21 MED ORDER — GLIPIZIDE 10 MG PO TABS: 5.0000 mg | ORAL_TABLET | Freq: Two times a day (BID) | ORAL | Status: DC

## 2015-08-21 MED ORDER — PIOGLITAZONE HCL 15 MG PO TABS: 15.0000 mg | ORAL_TABLET | Freq: Every day | ORAL | Status: DC

## 2015-08-21 MED ORDER — ENOXAPARIN SODIUM 30 MG/0.3ML ~~LOC~~ SOLN: 30.0000 mg | SUBCUTANEOUS | Status: DC

## 2015-08-21 MED ORDER — CARVEDILOL 12.5 MG PO TABS
25.0000 mg | ORAL_TABLET | Freq: Two times a day (BID) | ORAL | Status: DC
  Administered 2015-08-21: 25 mg via ORAL
  Filled 2015-08-21: qty 2

## 2015-08-21 NOTE — Care Management Obs Status (Signed)
[place] NOTIFICATION   Patient Details  Name: Justin [name] MRN: [id number] Date of Birth: March 20, 1941   Medicare Observation Status Notification Given:  Yes    Sherald Barge, RN 08/21/2015, 2:48 PM

## 2015-08-21 NOTE — Care Management Note (Signed)
Case Management Note  Patient Details  Name: Justin [name] MRN: [id number] Date of Birth: February 09, 1941  Subjective/Objective:                  Pt is from home, lives with wife and is ind with ADL's. Pt has no HH or DME prior to admission. Pt plans to return home with self care. Wife at bedside and supports DC plan.   Action/Plan: No CM needs.   Expected Discharge Date:    08/21/2015              Expected Discharge Plan:  Home/Self Care  In-House Referral:  NA  Discharge planning Services  CM Consult  Post Acute Care Choice:  NA Choice offered to:  NA  DME Arranged:    DME Agency:     HH Arranged:    HH Agency:     Status of Service:  Completed, signed off  Medicare Important Message Given:    Date Medicare IM Given:    Medicare IM give by:    Date Additional Medicare IM Given:    Additional Medicare Important Message give by:     If discussed at [place] of Stay Meetings, dates discussed:    Additional Comments:  Sherald Barge, RN 08/21/2015, 5:03 PM

## 2015-08-21 NOTE — Discharge Summary (Signed)
Physician Discharge Summary  Justin [name] [id number] DOB: 03-13-1941 DOA: 08/20/2015  PCP: Robert Bellow, MD  Admit date: 08/20/2015 Discharge date: 08/21/2015  Time spent: 35 minutes  Recommendations for Outpatient Follow-up:  Repeat BMET to follow electrolytes and renal function Reassess BP and adjust antihypertensive agents as needed Please follow CBG and resume rest of hypoglycemic agents if appropriate   Discharge Diagnoses:  Hypoglycemia Dehydration Type 2 diabetes with nephropathy Permanent atrial fibrillation ([place]) Nonischemic cardiomyopathy ([place]) Chronic systolic heart failure (H[facility]) AKI (acute kidney injury) ([place]) Hypokalemia   Discharge Condition: stable and improved. Discharge home with family care and instructions to follow up with PCP in 1 week.  Diet recommendation: heart healthy and low carb diet  Filed Weights   08/20/15 2356 08/21/15 0600  Weight: 89.2 kg (196 lb 10.4 oz) 89.2 kg (196 lb 10.4 oz)    History of present illness:  75 year old male with a history of hypertension, but it is mellitus, hyperlipidemia, atrial fibrillation, B12 deficiency presented with 2 day history of generalized weakness and diaphoresis. His symptoms began on the evening of 08/18/2015. The patient had some subjective fevers and chills without any associated shortness of breath, headache, vomiting, diarrhea, abdominal pain, dysuria, or hematuria. On the following day, the patient continued to have some generalized weakness. He checked his sugars on his glucometer. He stated that it read low, but he cannot remember the reading. He ate some candy and food and felt somewhat better. However within the next 24 hours he continued to have symptoms of weakness and diaphoresis. He presented to [facility] care on 08/20/2015. Apparently, the patient had a glucose of 30 in the office. It improved somewhat with drinking some juice in the office, but it went back down into the 60s. As result,  the patient was sent to the emergency department. In addition, the patient states that he has been on some type of antibiotic for upper respiratory tract infection for the past 4-5 days. He does not recall the name of the medication.He complains of a nonproductive cough without hemoptysis. In addition to his generalized weakness feeling and diaphoresis, he has been complaining of pain and "tension" in his neck and shoulders, but he attributes this to do more physical labor in his yard in the past few days. In the emergency department, the patient initially had a serum glucose of 71. Even after D50, it dropped back down to 66. As result, admission was requested. For the lab work revealed WBC 14.4 and serum creatinine 1.70. EKG showed atrial fibrillation with nonspecific ST changes in the lateral leads.   Hospital Course:  AKI -This likely is contributing to the patient's hypoglycemia resulting in increased half-life of his hypoglycemic agents -clinically improved and ready to be discharge with instructions to minimize/avoid nephrotoxic agents       -Holding losartan, metformin and lasix reduce to just 20 mg daily -Advise to keep himself well hydrated and to avoid NSAID's -needs BMET at follow up  Hypoglycemia in pt with DM2 -Due to his glipizide with increase half-life in the setting of AKI -patient received IVF's resuscitation using D5 1/2 NS -will hold actos, metformin and resume just 1/2 of glipizide -advise to follow low carb diet   Chronic systolic CHF/Nonischemic cardiomyopathy -10/22/2014 EF 25-30 percent, diffuse Hypokinesis K -Clinically euvolemic to hypovolemic -Daily weights -continue coreg -05/09/12 cath--with patent coronaries -neg troponin and no significant changes  -heart healthy diet encourage -lasix adjusted for the next 5-7 days due to ARF  Leukocytosis -  Blood cultures 2 sets ordered; so far has remained neg -No clear source of infection presently -Urinalysis negative  for pyuria -Chest x-ray negative for infiltrates -no fever and normal lactic acid -will monitor off antibiotics.  Permanent Atrial Fibrillation -continue coreg and digoxin for rate controlled  -rate controlled -CHADSVASc = 5  Neck pain/shoulder pain -presumed to be musculoskeletal  -troponin  Neg X 3 -no chest pain and no SOB -EKG without specific ST changes -doubt ACS  Hypokalemia  -Likely due to his diuretics use and poor PO intake -Mg WNL -potassium repleted and at discharge with prescription for daily supplementation   Hypertension  -stable and controlled with use of carvedilol  -advise to follow low sodium diet -reevaluation of BP during follow up visit and adjustment on his antihypertensive regimen as needed -at discharge will keep ARB on hold and adjust dose of lasix   Procedures:  See below for x-ray reports   Consultations:  None   Discharge Exam: Filed Vitals:   08/21/15 1000 08/21/15 1045  BP:    Pulse: 64 66  Temp:    Resp:      General: afebrile, no CP or SOB. Denies nausea and vomiting and tolerating PO intake Cardiovascular: rate controlled, no rubs or gallops Respiratory: no wheezing, no crackles and with good air movement   Discharge Instructions   Discharge Instructions    Diet - low sodium heart healthy    Complete by:  As directed      Discharge instructions    Complete by:  As directed   Take medications as prescribed Please follow low sodium diet and modified carbohydrates diet Arrange follow up with PCP in 1 week Please keep yourself well hydrated          Current Discharge Medication List    START taking these medications   Details  potassium chloride SA (K-DUR,KLOR-CON) 20 MEQ tablet Take 1 tablet (20 mEq total) by mouth daily. Qty: 30 tablet, Refills: 0      CONTINUE these medications which have CHANGED   Details  furosemide (LASIX) 40 MG tablet Take 0.5 tablets (20 mg total) by mouth daily. Please take this amount  of lasix until follow up with PCP in 5 days    glipiZIDE (GLUCOTROL) 10 MG tablet Take 0.5 tablets (5 mg total) by mouth 2 (two) times daily before a meal. Take adjusted dose of glipizide until follow up with PCP in 5 days    losartan (COZAAR) 100 MG tablet Take 1 tablet (100 mg total) by mouth daily. Hold until follow up with PCP in 5 days    metFORMIN (GLUCOPHAGE-XR) 500 MG 24 hr tablet Take 2 tablets (1,000 mg total) by mouth 2 (two) times daily. Stop this medication until you follow up with PCP in 5 days    pioglitazone (ACTOS) 15 MG tablet Take 1 tablet (15 mg total) by mouth daily. Please hold this medication until follow up with PCP in 5 days.      CONTINUE these medications which have NOT CHANGED   Details  carvedilol (COREG) 25 MG tablet Take 1 tablet (25 mg total) by mouth 2 (two) times daily. Qty: 180 tablet, Refills: 3    VENTOLIN HFA 108 (90 Base) MCG/ACT inhaler Inhale 1 puff into the lungs daily as needed for wheezing or shortness of breath.     Acetaminophen 500 MG coapsule Take 500 mg by mouth every 4 (four) hours as needed.     apixaban (ELIQUIS) 5 MG TABS tablet  Take 1 tablet (5 mg total) by mouth 2 (two) times daily. Qty: 56 tablet, Refills: 0    digoxin (LANOXIN) 0.125 MG tablet Take 1 tablet (0.125 mg total) by mouth daily. Qty: 90 tablet, Refills: 3    gemfibrozil (LOPID) 600 MG tablet Take 600 mg by mouth 2 (two) times daily before a meal.     Wheat Dextrin (BENEFIBER DRINK MIX PO) Take by mouth as directed.      STOP taking these medications     levofloxacin (LEVAQUIN) 750 MG tablet        Allergies  Allergen Reactions  . Niacin And Related     Unknown    Follow-up Information    Follow up with Robert Bellow, MD. Go in 1 week.   Specialty:  Family Medicine   Contact information:   [place] [address] [phone]       The results of significant diagnostics from this hospitalization (including imaging,  microbiology, ancillary and laboratory) are listed below for reference.    Significant Diagnostic Studies: Dg Chest 2 View  08/20/2015  CLINICAL DATA:  Productive cough and shortness of breath for 2 weeks. Congestive heart failure. Atrial fibrillation. Diaphoresis. EXAM: CHEST  2 VIEW COMPARISON:  07/02/2014 FINDINGS: Heart size remains at the upper limits of normal. Left-sided pleural-parenchymal scarring remains stable. No evidence of acute infiltrate or pulmonary edema. No evidence of pleural effusion. Multiple left rib fracture deformities and old left scapular fracture deformity are noted. Nipple shadow again seen overlying the right lung base which is stable. A small hiatal hernia is also demonstrated. IMPRESSION: No active cardiopulmonary disease. Stable left pleural- parenchymal scarring and old left rib and scapular fractures. Small hiatal hernia. Electronically Signed   By: Earle Gell M.D.   On: 08/20/2015 21:17    Microbiology: Recent Results (from the past 240 hour(s))  Culture, blood (Routine X 2) w Reflex to ID Panel     Status: None (Preliminary result)   Collection Time: 08/21/15 12:38 AM  Result Value Ref Range Status   Specimen Description BLOOD RIGHT ANTECUBITAL  Final   Special Requests BOTTLES DRAWN AEROBIC AND ANAEROBIC 4CC EACH  Final   Culture NO GROWTH < 12 HOURS  Final   Report Status PENDING  Incomplete  Culture, blood (Routine X 2) w Reflex to ID Panel     Status: None (Preliminary result)   Collection Time: 08/21/15 12:45 AM  Result Value Ref Range Status   Specimen Description BLOOD LEFT HAND  Final   Special Requests BOTTLES DRAWN AEROBIC AND ANAEROBIC 5CC EACH  Final   Culture NO GROWTH < 12 HOURS  Final   Report Status PENDING  Incomplete     Labs: Basic Metabolic Panel:  Recent Labs Lab 08/20/15 2040 08/21/15 0853  NA 140 141  K 3.0* 2.9*  CL 101 102  CO2 26 28  GLUCOSE 71 139*  BUN 40* 32*  CREATININE 1.77* 1.29*  CALCIUM 9.3 8.8*  MG  --   1.5*   Liver Function Tests:  Recent Labs Lab 08/20/15 2040  AST 19  ALT 11*  ALKPHOS 47  BILITOT 0.3  PROT 7.5  ALBUMIN 3.6    Recent Labs Lab 08/20/15 2040  LIPASE 38   CBC:  Recent Labs Lab 08/20/15 2040 08/21/15 0853  WBC 14.4* 12.3*  NEUTROABS 10.6*  --   HGB 13.2 12.5*  HCT 38.8* 37.2*  MCV 96.3 96.9  PLT 385 349   Cardiac Enzymes:  Recent Labs Lab 08/20/15 2040 08/21/15 0045 08/21/15 0853  TROPONINI <0.03 <0.03 <0.03   CBG:  Recent Labs Lab 08/20/15 2328 08/21/15 0148 08/21/15 0358 08/21/15 0737 08/21/15 1126  GLUCAP 125* 99 114* 103* 192*    Signed:  Barton Dubois MD.  [facility] 08/21/2015, 2:05 PM

## 2015-08-22 LAB — HEMOGLOBIN A1C
Hgb A1c MFr Bld: 6.9 % — ABNORMAL HIGH (ref 4.8–5.6)
MEAN PLASMA GLUCOSE: 151 mg/dL

## 2015-08-22 LAB — GLUCOSE, CAPILLARY: GLUCOSE-CAPILLARY: 78 mg/dL (ref 65–99)

## 2015-08-26 DIAGNOSIS — E1165 Type 2 diabetes mellitus with hyperglycemia: Secondary | ICD-10-CM | POA: Diagnosis not present

## 2015-08-26 DIAGNOSIS — E160 Drug-induced hypoglycemia without coma: Secondary | ICD-10-CM | POA: Diagnosis not present

## 2015-08-26 DIAGNOSIS — I10 Essential (primary) hypertension: Secondary | ICD-10-CM | POA: Diagnosis not present

## 2015-08-26 DIAGNOSIS — I952 Hypotension due to drugs: Secondary | ICD-10-CM | POA: Diagnosis not present

## 2015-08-26 LAB — CULTURE, BLOOD (ROUTINE X 2)
CULTURE: NO GROWTH
CULTURE: NO GROWTH

## 2015-08-27 IMAGING — CR DG CHEST 2V
2 series · 2 of 2 positions shown · non-contrast
Comparison: Chest x-ray of 12/04/2013

CLINICAL DATA: Productive cough and shortness of breath for 1 week

EXAM:
CHEST  2 VIEW

[view not recorded (1 of 2)]
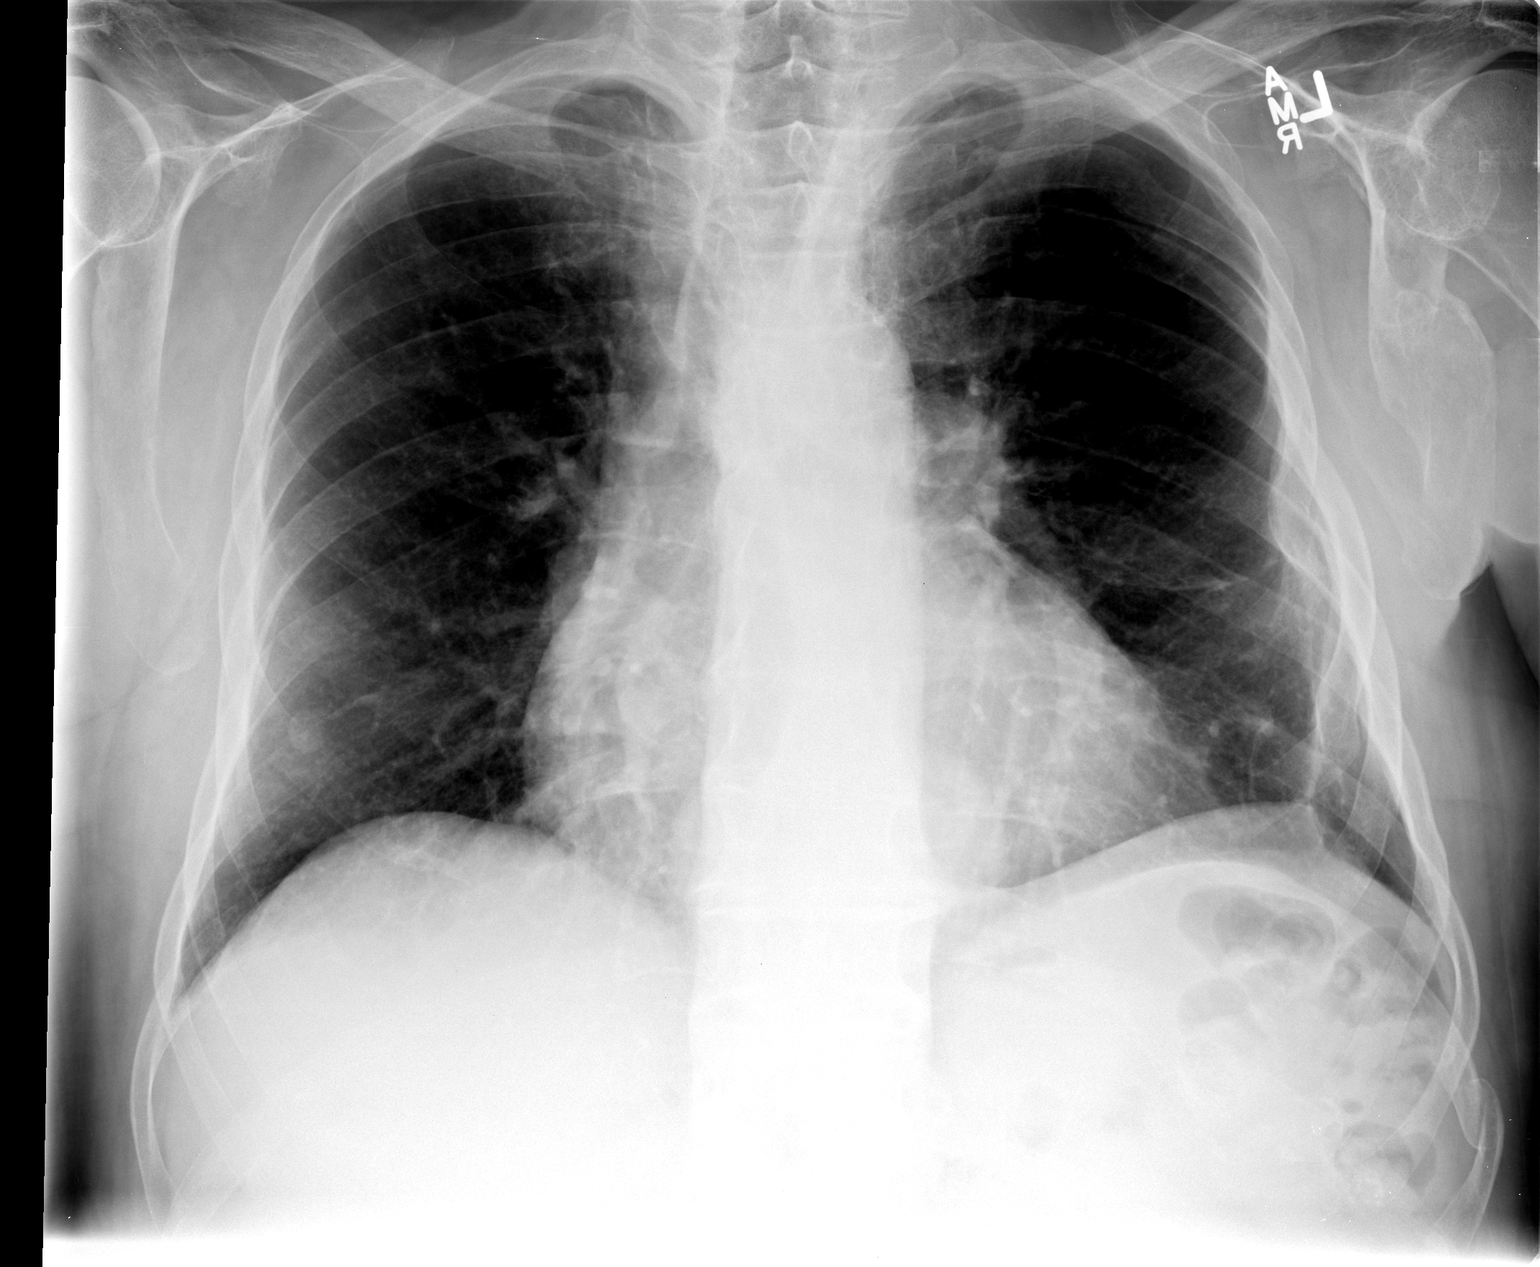

[view not recorded (2 of 2)]
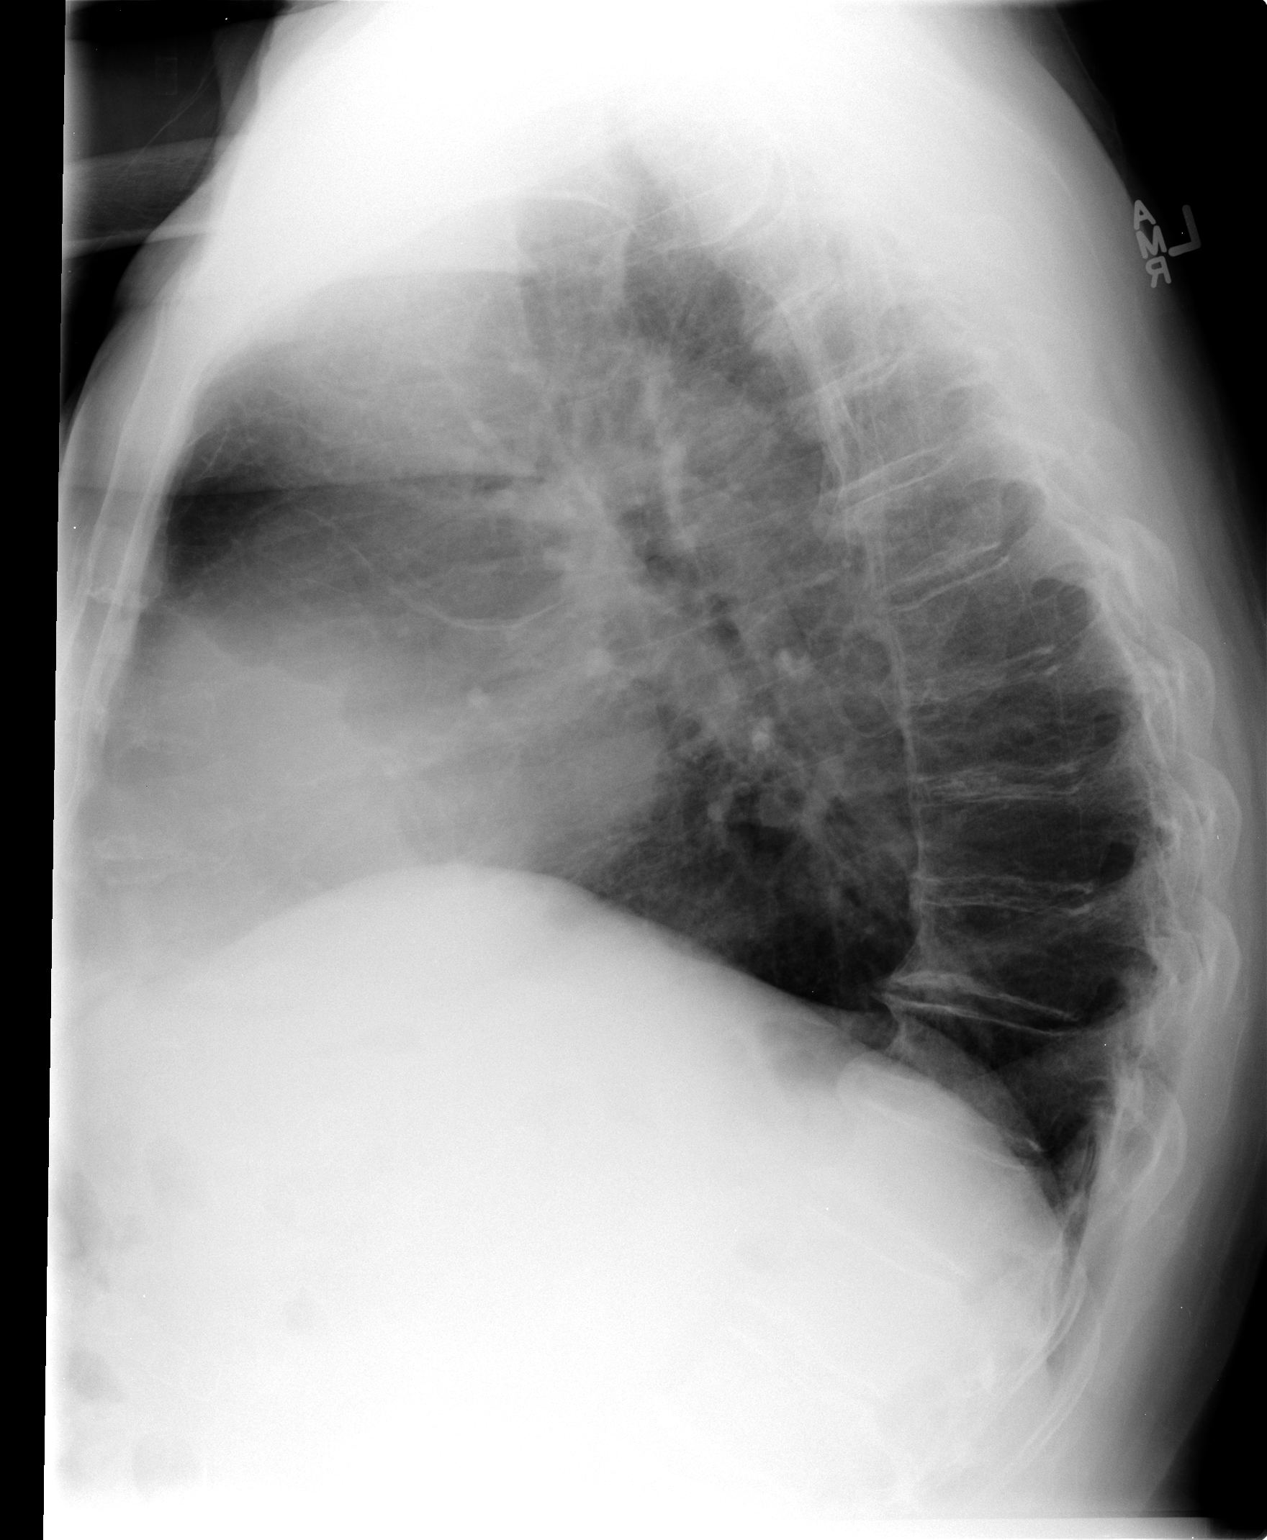

[2 of 2 positions shown; findings below may reference images not displayed]

FINDINGS: No focal infiltrate or effusion is seen. The lungs remain slightly
hyperaerated. There does appear to be a small hiatal hernia present.
Cardiomegaly is stable. Mediastinal and hilar contours are
unchanged. Scarring in the left lateral hemi thorax is unchanged.
There are degenerative changes throughout the thoracic spine.
IMPRESSION: 1. Stable cardiomegaly and scarring at the left lung base. No
definite active process.
2. Small hiatal hernia.

## 2015-08-29 ENCOUNTER — Telehealth: Payer: Self-pay | Admitting: Cardiology

## 2015-08-29 MED ORDER — APIXABAN 5 MG PO TABS: 5.0000 mg | ORAL_TABLET | Freq: Two times a day (BID) | ORAL | Status: DC

## 2015-08-29 NOTE — Telephone Encounter (Signed)
Patient is requesting samples of Eliquis.  °

## 2015-08-29 NOTE — Telephone Encounter (Signed)
Returned call to patient.  Message left on voice mail that samples will be left up front for pick up.

## 2015-09-17 DIAGNOSIS — I482 Chronic atrial fibrillation: Secondary | ICD-10-CM | POA: Diagnosis not present

## 2015-09-17 DIAGNOSIS — E1142 Type 2 diabetes mellitus with diabetic polyneuropathy: Secondary | ICD-10-CM | POA: Diagnosis not present

## 2015-09-17 DIAGNOSIS — Z7901 Long term (current) use of anticoagulants: Secondary | ICD-10-CM | POA: Diagnosis not present

## 2015-09-17 DIAGNOSIS — Z79891 Long term (current) use of opiate analgesic: Secondary | ICD-10-CM | POA: Diagnosis not present

## 2015-09-19 DIAGNOSIS — H5210 Myopia, unspecified eye: Secondary | ICD-10-CM | POA: Diagnosis not present

## 2015-09-19 DIAGNOSIS — E119 Type 2 diabetes mellitus without complications: Secondary | ICD-10-CM | POA: Diagnosis not present

## 2015-09-19 DIAGNOSIS — Z0100 Encounter for examination of eyes and vision without abnormal findings: Secondary | ICD-10-CM | POA: Diagnosis not present

## 2015-09-23 DIAGNOSIS — I43 Cardiomyopathy in diseases classified elsewhere: Secondary | ICD-10-CM | POA: Diagnosis not present

## 2015-09-23 DIAGNOSIS — I482 Chronic atrial fibrillation: Secondary | ICD-10-CM | POA: Diagnosis not present

## 2015-09-23 DIAGNOSIS — E782 Mixed hyperlipidemia: Secondary | ICD-10-CM | POA: Diagnosis not present

## 2015-09-23 DIAGNOSIS — E1165 Type 2 diabetes mellitus with hyperglycemia: Secondary | ICD-10-CM | POA: Diagnosis not present

## 2015-09-23 DIAGNOSIS — M10071 Idiopathic gout, right ankle and foot: Secondary | ICD-10-CM | POA: Diagnosis not present

## 2015-09-23 DIAGNOSIS — E119 Type 2 diabetes mellitus without complications: Secondary | ICD-10-CM | POA: Diagnosis not present

## 2015-09-23 DIAGNOSIS — I10 Essential (primary) hypertension: Secondary | ICD-10-CM | POA: Diagnosis not present

## 2015-09-23 DIAGNOSIS — I5022 Chronic systolic (congestive) heart failure: Secondary | ICD-10-CM | POA: Diagnosis not present

## 2015-09-23 DIAGNOSIS — E6609 Other obesity due to excess calories: Secondary | ICD-10-CM | POA: Diagnosis not present

## 2015-09-23 DIAGNOSIS — E1140 Type 2 diabetes mellitus with diabetic neuropathy, unspecified: Secondary | ICD-10-CM | POA: Diagnosis not present

## 2015-09-26 ENCOUNTER — Other Ambulatory Visit: Payer: Self-pay | Admitting: *Deleted

## 2015-09-26 MED ORDER — APIXABAN 5 MG PO TABS: 5.0000 mg | ORAL_TABLET | Freq: Two times a day (BID) | ORAL | Status: DC

## 2015-10-22 ENCOUNTER — Telehealth: Payer: Self-pay

## 2015-10-22 NOTE — Telephone Encounter (Signed)
Faxed signed INR standing order renewal form to [facility] in [place]. 

## 2015-10-31 ENCOUNTER — Other Ambulatory Visit: Payer: Self-pay | Admitting: *Deleted

## 2015-10-31 MED ORDER — APIXABAN 5 MG PO TABS: 5.0000 mg | ORAL_TABLET | Freq: Two times a day (BID) | ORAL | Status: DC

## 2015-11-04 DIAGNOSIS — R69 Illness, unspecified: Secondary | ICD-10-CM | POA: Diagnosis not present

## 2015-12-04 ENCOUNTER — Ambulatory Visit (INDEPENDENT_AMBULATORY_CARE_PROVIDER_SITE_OTHER): Payer: Medicare HMO | Admitting: *Deleted

## 2015-12-04 DIAGNOSIS — Z5181 Encounter for therapeutic drug level monitoring: Secondary | ICD-10-CM

## 2015-12-04 DIAGNOSIS — I4891 Unspecified atrial fibrillation: Secondary | ICD-10-CM | POA: Diagnosis not present

## 2015-12-04 LAB — CBC
HCT: 39.7 % (ref 38.5–50.0)
Hemoglobin: 13.1 g/dL — ABNORMAL LOW (ref 13.2–17.1)
MCH: 31.3 pg (ref 27.0–33.0)
MCHC: 33 g/dL (ref 32.0–36.0)
MCV: 94.7 fL (ref 80.0–100.0)
MPV: 11.4 fL (ref 7.5–12.5)
PLATELETS: 366 10*3/uL (ref 140–400)
RBC: 4.19 MIL/uL — AB (ref 4.20–5.80)
RDW: 14.7 % (ref 11.0–15.0)
WBC: 9.1 10*3/uL (ref 3.8–10.8)

## 2015-12-04 NOTE — Progress Notes (Signed)
Pt was started on Eliquis 5mg  bid for atrial fib on 10/18/14 by Dr Harl Bowie.    Pt states he is tolerating Eliquis well. No s/s of increased bruising, bleeding or GI upset.  Reviewed patients medication list. Pt is not currently on any combined P-gp and strong CYP3A4 inhibitors/inducers (ketoconazole, traconazole, ritonavir, carbamazepine, phenytoin, rifampin, St. John's wort). Reviewed labs from 05/27/15 @ [facility] 0.94, Weight 207.4, CrCl 75.62. Dose is appropriate based on 2 out of 3 criteria (age, weight, SrCr). Hgb and HCT: 13.1/39.7  A full discussion of the nature of anticoagulants has been carried out. A benefit/risk analysis has been presented to the patient, so that they understand the justification for choosing anticoagulation with Eliquis at this time. The need for compliance is stressed. Pt is aware to take the medication twice daily. Side effects of potential bleeding are discussed, including unusual colored urine or stools, coughing up blood or coffee ground emesis, nose bleeds or serious fall or head trauma. Discussed signs and symptoms of stroke. The patient should avoid any OTC items containing aspirin or ibuprofen. Avoid alcohol consumption. Call if any signs of abnormal bleeding. Discussed financial obligations and resolved any difficulty in obtaining medication. Next lab test test in 6 months.   Pt notified. F/U in 6 months

## 2015-12-05 LAB — BASIC METABOLIC PANEL
BUN: 20 mg/dL (ref 7–25)
CO2: 27 mmol/L (ref 20–31)
Calcium: 9.6 mg/dL (ref 8.6–10.3)
Chloride: 96 mmol/L — ABNORMAL LOW (ref 98–110)
Creat: 0.94 mg/dL (ref 0.70–1.18)
Glucose, Bld: 194 mg/dL — ABNORMAL HIGH (ref 65–99)
POTASSIUM: 4.1 mmol/L (ref 3.5–5.3)
SODIUM: 139 mmol/L (ref 135–146)

## 2015-12-09 ENCOUNTER — Telehealth: Payer: Self-pay | Admitting: *Deleted

## 2015-12-09 NOTE — Telephone Encounter (Signed)
Anti coag nurse Lattie Haw made pt aware of lab results

## 2015-12-09 NOTE — Telephone Encounter (Signed)
-----   Message from Arnoldo Lenis, MD sent at 12/05/2015  8:18 AM EDT ----- Labs look good  Zandra Abts MD

## 2015-12-15 DIAGNOSIS — E782 Mixed hyperlipidemia: Secondary | ICD-10-CM | POA: Diagnosis not present

## 2015-12-15 DIAGNOSIS — I10 Essential (primary) hypertension: Secondary | ICD-10-CM | POA: Diagnosis not present

## 2015-12-15 DIAGNOSIS — E6609 Other obesity due to excess calories: Secondary | ICD-10-CM | POA: Diagnosis not present

## 2015-12-15 DIAGNOSIS — E1140 Type 2 diabetes mellitus with diabetic neuropathy, unspecified: Secondary | ICD-10-CM | POA: Diagnosis not present

## 2015-12-15 DIAGNOSIS — I43 Cardiomyopathy in diseases classified elsewhere: Secondary | ICD-10-CM | POA: Diagnosis not present

## 2015-12-15 DIAGNOSIS — M10071 Idiopathic gout, right ankle and foot: Secondary | ICD-10-CM | POA: Diagnosis not present

## 2015-12-15 DIAGNOSIS — E119 Type 2 diabetes mellitus without complications: Secondary | ICD-10-CM | POA: Diagnosis not present

## 2015-12-15 DIAGNOSIS — E1142 Type 2 diabetes mellitus with diabetic polyneuropathy: Secondary | ICD-10-CM | POA: Diagnosis not present

## 2015-12-15 DIAGNOSIS — I482 Chronic atrial fibrillation: Secondary | ICD-10-CM | POA: Diagnosis not present

## 2015-12-15 DIAGNOSIS — I5022 Chronic systolic (congestive) heart failure: Secondary | ICD-10-CM | POA: Diagnosis not present

## 2015-12-15 DIAGNOSIS — E1165 Type 2 diabetes mellitus with hyperglycemia: Secondary | ICD-10-CM | POA: Diagnosis not present

## 2015-12-25 ENCOUNTER — Other Ambulatory Visit: Payer: Self-pay | Admitting: *Deleted

## 2015-12-25 MED ORDER — FUROSEMIDE 40 MG PO TABS: 20.0000 mg | ORAL_TABLET | Freq: Every day | ORAL | Status: DC

## 2016-01-12 DIAGNOSIS — L298 Other pruritus: Secondary | ICD-10-CM | POA: Diagnosis not present

## 2016-01-12 DIAGNOSIS — E1142 Type 2 diabetes mellitus with diabetic polyneuropathy: Secondary | ICD-10-CM | POA: Diagnosis not present

## 2016-01-29 ENCOUNTER — Ambulatory Visit (INDEPENDENT_AMBULATORY_CARE_PROVIDER_SITE_OTHER): Payer: Medicare HMO | Admitting: Cardiology

## 2016-01-29 ENCOUNTER — Encounter: Payer: Self-pay | Admitting: Cardiology

## 2016-01-29 VITALS — BP 136/73 | HR 57 | Ht 70.0 in | Wt 209.4 lb

## 2016-01-29 DIAGNOSIS — I10 Essential (primary) hypertension: Secondary | ICD-10-CM | POA: Diagnosis not present

## 2016-01-29 DIAGNOSIS — I4891 Unspecified atrial fibrillation: Secondary | ICD-10-CM

## 2016-01-29 DIAGNOSIS — E785 Hyperlipidemia, unspecified: Secondary | ICD-10-CM | POA: Diagnosis not present

## 2016-01-29 DIAGNOSIS — I5022 Chronic systolic (congestive) heart failure: Secondary | ICD-10-CM | POA: Diagnosis not present

## 2016-01-29 MED ORDER — APIXABAN 5 MG PO TABS: 5.0000 mg | ORAL_TABLET | Freq: Two times a day (BID) | ORAL | 0 refills | Status: DC

## 2016-01-29 NOTE — Patient Instructions (Signed)
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
## 2016-02-03 ENCOUNTER — Telehealth: Payer: Self-pay | Admitting: *Deleted

## 2016-02-03 MED ORDER — ATORVASTATIN CALCIUM 20 MG PO TABS
20.0000 mg | ORAL_TABLET | Freq: Every day | ORAL | 0 refills | Status: DC
Start: 2016-02-03 — End: 2016-04-24

## 2016-02-03 NOTE — Telephone Encounter (Signed)
Pt and wife voiced understanding, Atorvastatin sent to pharmacy

## 2016-02-03 NOTE — Telephone Encounter (Signed)
-----   Message from Arnoldo Lenis, MD sent at 01/31/2016  5:03 PM EDT ----- Please have patient stop gemfibrozil, start atorva 20mg  daily   Zandra Abts MD

## 2016-03-08 ENCOUNTER — Other Ambulatory Visit (HOSPITAL_COMMUNITY): Payer: Self-pay | Admitting: Family Medicine

## 2016-03-08 ENCOUNTER — Ambulatory Visit (HOSPITAL_COMMUNITY)
Admission: RE | Admit: 2016-03-08 | Discharge: 2016-03-08 | Disposition: A | Discharge status: Home / Self Care | Payer: Medicare HMO | Source: Ambulatory Visit | Attending: Family Medicine | Admitting: Family Medicine

## 2016-03-08 DIAGNOSIS — I509 Heart failure, unspecified: Secondary | ICD-10-CM | POA: Insufficient documentation

## 2016-03-08 DIAGNOSIS — E782 Mixed hyperlipidemia: Secondary | ICD-10-CM | POA: Diagnosis not present

## 2016-03-08 DIAGNOSIS — I5022 Chronic systolic (congestive) heart failure: Secondary | ICD-10-CM | POA: Diagnosis not present

## 2016-03-08 DIAGNOSIS — I43 Cardiomyopathy in diseases classified elsewhere: Secondary | ICD-10-CM | POA: Diagnosis not present

## 2016-03-08 DIAGNOSIS — I482 Chronic atrial fibrillation: Secondary | ICD-10-CM | POA: Diagnosis not present

## 2016-03-08 DIAGNOSIS — E1165 Type 2 diabetes mellitus with hyperglycemia: Secondary | ICD-10-CM | POA: Diagnosis not present

## 2016-03-08 DIAGNOSIS — R0989 Other specified symptoms and signs involving the circulatory and respiratory systems: Secondary | ICD-10-CM

## 2016-03-08 DIAGNOSIS — E119 Type 2 diabetes mellitus without complications: Secondary | ICD-10-CM | POA: Diagnosis not present

## 2016-03-08 DIAGNOSIS — I10 Essential (primary) hypertension: Secondary | ICD-10-CM | POA: Diagnosis not present

## 2016-03-08 DIAGNOSIS — E1140 Type 2 diabetes mellitus with diabetic neuropathy, unspecified: Secondary | ICD-10-CM | POA: Diagnosis not present

## 2016-03-08 DIAGNOSIS — R05 Cough: Secondary | ICD-10-CM | POA: Insufficient documentation

## 2016-03-08 DIAGNOSIS — R600 Localized edema: Secondary | ICD-10-CM | POA: Diagnosis not present

## 2016-03-10 DIAGNOSIS — I5022 Chronic systolic (congestive) heart failure: Secondary | ICD-10-CM | POA: Diagnosis not present

## 2016-03-10 DIAGNOSIS — I482 Chronic atrial fibrillation: Secondary | ICD-10-CM | POA: Diagnosis not present

## 2016-03-10 DIAGNOSIS — J811 Chronic pulmonary edema: Secondary | ICD-10-CM | POA: Diagnosis not present

## 2016-04-22 ENCOUNTER — Telehealth: Payer: Self-pay

## 2016-04-22 NOTE — Telephone Encounter (Signed)
Faxed signed INR standing order renewal form to [facility] in [place].

## 2016-04-24 ENCOUNTER — Other Ambulatory Visit: Payer: Self-pay | Admitting: Cardiology

## 2016-04-26 DIAGNOSIS — Z23 Encounter for immunization: Secondary | ICD-10-CM | POA: Diagnosis not present

## 2016-05-11 ENCOUNTER — Other Ambulatory Visit: Payer: Self-pay | Admitting: *Deleted

## 2016-05-11 MED ORDER — APIXABAN 5 MG PO TABS: 5.0000 mg | ORAL_TABLET | Freq: Two times a day (BID) | ORAL | 0 refills | Status: DC

## 2016-05-17 ENCOUNTER — Telehealth: Payer: Self-pay | Admitting: Cardiology

## 2016-05-17 NOTE — Telephone Encounter (Signed)
I'm ok with him having the procedure done. He would need to stop his eliquis 2 days prior to surgery and restart one day after.  Zandra Abts MD

## 2016-05-17 NOTE — Telephone Encounter (Signed)
Pt sister (Ms. Thayer Jew) made aware and voiced understanding

## 2016-05-17 NOTE — Telephone Encounter (Signed)
Justin [name] needs to have dental surgery.  Went to see a Pharmacist, community and they want to pull all of his teeth a one time.  Family is concerned about having this procedure done with his heart issues.

## 2016-05-31 ENCOUNTER — Other Ambulatory Visit: Payer: Self-pay | Admitting: *Deleted

## 2016-05-31 MED ORDER — APIXABAN 5 MG PO TABS: 5.0000 mg | ORAL_TABLET | Freq: Two times a day (BID) | ORAL | 0 refills | Status: DC

## 2016-06-18 ENCOUNTER — Telehealth: Payer: Self-pay | Admitting: Cardiology

## 2016-06-18 MED ORDER — APIXABAN 5 MG PO TABS: 5.0000 mg | ORAL_TABLET | Freq: Two times a day (BID) | ORAL | 0 refills | Status: DC

## 2016-06-18 NOTE — Telephone Encounter (Signed)
Pt wanting to know if needed an appt to check INR or have cbc done - last note from 11/2015 says to have repeat labs in 6 months. Pt restarting Eliquis today after dental procedure (see phone note 04/2016) message sent to coumadin nurse to verify. Pt aware that would not get an answer until 06/22/16. Requested samples of Eliquis - ready for pickup

## 2016-06-18 NOTE — Telephone Encounter (Signed)
Mr. Gehm has questions about his Eliquis

## 2016-06-22 ENCOUNTER — Other Ambulatory Visit: Payer: Self-pay | Admitting: *Deleted

## 2016-06-22 DIAGNOSIS — Z5181 Encounter for therapeutic drug level monitoring: Secondary | ICD-10-CM

## 2016-06-22 DIAGNOSIS — I4891 Unspecified atrial fibrillation: Secondary | ICD-10-CM

## 2016-07-01 ENCOUNTER — Other Ambulatory Visit: Payer: Self-pay | Admitting: Cardiovascular Disease

## 2016-07-01 DIAGNOSIS — C131 Malignant neoplasm of aryepiglottic fold, hypopharyngeal aspect: Secondary | ICD-10-CM | POA: Diagnosis not present

## 2016-07-01 DIAGNOSIS — Z7901 Long term (current) use of anticoagulants: Secondary | ICD-10-CM | POA: Diagnosis not present

## 2016-07-02 LAB — PROTIME-INR
INR: 1.2 — ABNORMAL HIGH
Prothrombin Time: 12.9 s — ABNORMAL HIGH (ref 9.0–11.5)

## 2016-07-09 DIAGNOSIS — J209 Acute bronchitis, unspecified: Secondary | ICD-10-CM | POA: Diagnosis not present

## 2016-07-09 DIAGNOSIS — I482 Chronic atrial fibrillation: Secondary | ICD-10-CM | POA: Diagnosis not present

## 2016-07-12 ENCOUNTER — Telehealth: Payer: Self-pay | Admitting: *Deleted

## 2016-07-12 NOTE — Telephone Encounter (Signed)
Two boxes of Eliquis Lot [id number], Exp: Mar 2020 placed at the front for pick-up

## 2016-07-16 DIAGNOSIS — J209 Acute bronchitis, unspecified: Secondary | ICD-10-CM | POA: Diagnosis not present

## 2016-07-22 ENCOUNTER — Other Ambulatory Visit: Payer: Self-pay | Admitting: *Deleted

## 2016-07-22 MED ORDER — APIXABAN 5 MG PO TABS: 5.0000 mg | ORAL_TABLET | Freq: Two times a day (BID) | ORAL | 3 refills | Status: DC

## 2016-08-17 ENCOUNTER — Telehealth: Payer: Self-pay | Admitting: *Deleted

## 2016-08-17 DIAGNOSIS — I482 Chronic atrial fibrillation: Secondary | ICD-10-CM | POA: Diagnosis not present

## 2016-08-17 DIAGNOSIS — Z7901 Long term (current) use of anticoagulants: Secondary | ICD-10-CM | POA: Diagnosis not present

## 2016-08-17 DIAGNOSIS — I10 Essential (primary) hypertension: Secondary | ICD-10-CM | POA: Diagnosis not present

## 2016-08-17 DIAGNOSIS — I5022 Chronic systolic (congestive) heart failure: Secondary | ICD-10-CM | POA: Diagnosis not present

## 2016-08-17 NOTE — Telephone Encounter (Signed)
Pt approved for pt assistance for Eliquis through [id number] - application case # [id number]

## 2016-08-24 ENCOUNTER — Ambulatory Visit (INDEPENDENT_AMBULATORY_CARE_PROVIDER_SITE_OTHER): Payer: Medicare HMO | Admitting: *Deleted

## 2016-08-24 DIAGNOSIS — I480 Paroxysmal atrial fibrillation: Secondary | ICD-10-CM

## 2016-08-24 DIAGNOSIS — E1142 Type 2 diabetes mellitus with diabetic polyneuropathy: Secondary | ICD-10-CM | POA: Diagnosis not present

## 2016-08-24 DIAGNOSIS — M109 Gout, unspecified: Secondary | ICD-10-CM | POA: Diagnosis not present

## 2016-08-24 DIAGNOSIS — Z125 Encounter for screening for malignant neoplasm of prostate: Secondary | ICD-10-CM | POA: Diagnosis not present

## 2016-08-24 DIAGNOSIS — I5022 Chronic systolic (congestive) heart failure: Secondary | ICD-10-CM | POA: Diagnosis not present

## 2016-08-24 DIAGNOSIS — Z7901 Long term (current) use of anticoagulants: Secondary | ICD-10-CM | POA: Diagnosis not present

## 2016-08-24 DIAGNOSIS — I10 Essential (primary) hypertension: Secondary | ICD-10-CM | POA: Diagnosis not present

## 2016-08-24 DIAGNOSIS — I482 Chronic atrial fibrillation: Secondary | ICD-10-CM | POA: Diagnosis not present

## 2016-08-24 NOTE — Progress Notes (Signed)
Pt was started on Eliquis 5mg  bid for atrial fib on 10/18/14 by Dr Harl Bowie.    Pt states he is tolerating Eliquis well. No s/s of increased bruising, bleeding or GI upset.  Reviewed patients medication list. Pt is not currently on any combined P-gp and strong CYP3A4 inhibitors/inducers (ketoconazole, traconazole, ritonavir, carbamazepine, phenytoin, rifampin, St. John's wort). Reviewed labs from 08/24/16 @ [facility] SCr 0.90 , Weight 97.6 kg, CrCl 94.89. Dose is appropriate based on 2 out of 3 criteria (age, weight, SrCr). Hgb and HCT: 12.4/38.0  A full discussion of the nature of anticoagulants has been carried out. A benefit/risk analysis has been presented to the patient, so that they understand the justification for choosing anticoagulation with Eliquis at this time. The need for compliance is stressed. Pt is aware to take the medication twice daily. Side effects of potential bleeding are discussed, including unusual colored urine or stools, coughing up blood or coffee ground emesis, nose bleeds or serious fall or head trauma. Discussed signs and symptoms of stroke. The patient should avoid any OTC items containing aspirin or ibuprofen. Avoid alcohol consumption. Call if any signs of abnormal bleeding. Discussed financial obligations and resolved any difficulty in obtaining medication. Next lab test test in 6 months.  Appt made.

## 2016-09-01 DIAGNOSIS — E1165 Type 2 diabetes mellitus with hyperglycemia: Secondary | ICD-10-CM | POA: Diagnosis not present

## 2016-09-01 DIAGNOSIS — I43 Cardiomyopathy in diseases classified elsewhere: Secondary | ICD-10-CM | POA: Diagnosis not present

## 2016-09-01 DIAGNOSIS — E538 Deficiency of other specified B group vitamins: Secondary | ICD-10-CM | POA: Diagnosis not present

## 2016-09-01 DIAGNOSIS — E119 Type 2 diabetes mellitus without complications: Secondary | ICD-10-CM | POA: Diagnosis not present

## 2016-09-01 DIAGNOSIS — I5022 Chronic systolic (congestive) heart failure: Secondary | ICD-10-CM | POA: Diagnosis not present

## 2016-09-01 DIAGNOSIS — R799 Abnormal finding of blood chemistry, unspecified: Secondary | ICD-10-CM | POA: Diagnosis not present

## 2016-09-01 DIAGNOSIS — E782 Mixed hyperlipidemia: Secondary | ICD-10-CM | POA: Diagnosis not present

## 2016-09-01 DIAGNOSIS — I482 Chronic atrial fibrillation: Secondary | ICD-10-CM | POA: Diagnosis not present

## 2016-09-01 DIAGNOSIS — E6609 Other obesity due to excess calories: Secondary | ICD-10-CM | POA: Diagnosis not present

## 2016-09-01 DIAGNOSIS — E1142 Type 2 diabetes mellitus with diabetic polyneuropathy: Secondary | ICD-10-CM | POA: Diagnosis not present

## 2016-09-01 DIAGNOSIS — D509 Iron deficiency anemia, unspecified: Secondary | ICD-10-CM | POA: Diagnosis not present

## 2016-09-01 DIAGNOSIS — E1140 Type 2 diabetes mellitus with diabetic neuropathy, unspecified: Secondary | ICD-10-CM | POA: Diagnosis not present

## 2016-09-02 ENCOUNTER — Encounter: Payer: Self-pay | Admitting: *Deleted

## 2016-09-14 DIAGNOSIS — Z7901 Long term (current) use of anticoagulants: Secondary | ICD-10-CM | POA: Diagnosis not present

## 2016-09-14 DIAGNOSIS — D509 Iron deficiency anemia, unspecified: Secondary | ICD-10-CM | POA: Diagnosis not present

## 2016-09-14 DIAGNOSIS — E782 Mixed hyperlipidemia: Secondary | ICD-10-CM | POA: Diagnosis not present

## 2016-09-14 DIAGNOSIS — I2510 Atherosclerotic heart disease of native coronary artery without angina pectoris: Secondary | ICD-10-CM | POA: Diagnosis not present

## 2016-09-27 ENCOUNTER — Encounter: Payer: Self-pay | Admitting: *Deleted

## 2016-09-27 ENCOUNTER — Encounter: Payer: Self-pay | Admitting: Cardiology

## 2016-09-27 ENCOUNTER — Ambulatory Visit (INDEPENDENT_AMBULATORY_CARE_PROVIDER_SITE_OTHER): Payer: Medicare HMO | Admitting: Cardiology

## 2016-09-27 VITALS — BP 151/85 | HR 70 | Ht 72.0 in | Wt 209.6 lb

## 2016-09-27 DIAGNOSIS — E782 Mixed hyperlipidemia: Secondary | ICD-10-CM | POA: Diagnosis not present

## 2016-09-27 DIAGNOSIS — I5022 Chronic systolic (congestive) heart failure: Secondary | ICD-10-CM | POA: Diagnosis not present

## 2016-09-27 DIAGNOSIS — I10 Essential (primary) hypertension: Secondary | ICD-10-CM | POA: Diagnosis not present

## 2016-09-27 DIAGNOSIS — I4891 Unspecified atrial fibrillation: Secondary | ICD-10-CM
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
## 2016-09-27 NOTE — Patient Instructions (Signed)
Medication Instructions:  Continue all current medications.  Labwork: none  Testing/Procedures: none  Follow-Up: Your physician wants you to follow up in:  4 months.  You will receive a reminder letter in the mail one-two months in advance.  If you don't receive a letter, please call our office to schedule the follow up appointment   Any Other Special Instructions Will Be Listed Below (If Applicable). Your physician has requested that you regularly monitor and record your blood pressure readings at home daily till Friday.  Please call the office on Friday to give office BP readings.    If you need a refill on your cardiac medications before your next appointment, please call your pharmacy.

## 2016-09-28 ENCOUNTER — Telehealth: Payer: Self-pay | Admitting: Cardiology

## 2016-09-28 ENCOUNTER — Telehealth: Payer: Self-pay

## 2016-09-28 MED ORDER — LOSARTAN POTASSIUM 25 MG PO TABS: 12.5000 mg | ORAL_TABLET | Freq: Every day | ORAL | 1 refills | Status: DC

## 2016-09-28 NOTE — Telephone Encounter (Signed)
Audry Pili called needs clarification about patients medication.    Leave a message and he will call right back if he doesn't answer.

## 2016-09-28 NOTE — Telephone Encounter (Signed)
Stop HCTZ, start losartan 12.5mg  daily  Zandra Abts MD

## 2016-09-28 NOTE — Telephone Encounter (Signed)
Pt son voiced understanding - new rx sent to [facility]

## 2016-09-28 NOTE — Telephone Encounter (Signed)
Faxed signed standing order for PT INR to [facility] on 09/17/16.

## 2016-09-28 NOTE — Telephone Encounter (Signed)
Pt son Audry Pili) came by office says that yesterday's AVS had pt on losartan - says pt hasn't taken this and doesn't have at home - says he is taking HCTZ 12.5 MG daily which wasn't on the med list. Wanted to make sure we updated medication list and that Dr Harl Bowie was made aware

## 2016-09-29 DIAGNOSIS — W57XXXA Bitten or stung by nonvenomous insect and other nonvenomous arthropods, initial encounter: Secondary | ICD-10-CM | POA: Diagnosis not present

## 2016-09-29 DIAGNOSIS — R21 Rash and other nonspecific skin eruption: Secondary | ICD-10-CM | POA: Diagnosis not present

## 2016-10-01 ENCOUNTER — Telehealth: Payer: Self-pay | Admitting: *Deleted

## 2016-10-01 MED ORDER — LOSARTAN POTASSIUM 25 MG PO TABS: 25.0000 mg | ORAL_TABLET | Freq: Every day | ORAL | 1 refills | Status: DC

## 2016-10-01 NOTE — Telephone Encounter (Signed)
Bp's are too high, please increase losartan to 25mg  daily. Please be sure his wife is aware as well as he has some difficulty understanding his regimen   Zandra Abts MD

## 2016-10-01 NOTE — Telephone Encounter (Signed)
Pt f/u with BP readings  4/2 - 169/78 4/3 - 171/76 4/4 - 166/91 4/5 - 167/102

## 2016-10-01 NOTE — Telephone Encounter (Signed)
Pt wife Terrence Dupont) voiced understanding - updated rx sent to [facility]

## 2016-11-08 ENCOUNTER — Other Ambulatory Visit: Payer: Self-pay | Admitting: *Deleted

## 2016-11-08 MED ORDER — LOSARTAN POTASSIUM 25 MG PO TABS: 25.0000 mg | ORAL_TABLET | Freq: Every day | ORAL | 1 refills | Status: DC

## 2016-11-08 NOTE — Telephone Encounter (Signed)
Pt son requesting losartan #90 sent to [facility] having tech problems and cannot fill medications at this time. Medication sent to pharmacy.

## 2016-11-17 DIAGNOSIS — E1165 Type 2 diabetes mellitus with hyperglycemia: Secondary | ICD-10-CM | POA: Diagnosis not present

## 2016-11-17 DIAGNOSIS — I482 Chronic atrial fibrillation: Secondary | ICD-10-CM | POA: Diagnosis not present

## 2016-11-17 DIAGNOSIS — I10 Essential (primary) hypertension: Secondary | ICD-10-CM | POA: Diagnosis not present

## 2016-11-17 DIAGNOSIS — I43 Cardiomyopathy in diseases classified elsewhere: Secondary | ICD-10-CM | POA: Diagnosis not present

## 2016-11-17 DIAGNOSIS — D649 Anemia, unspecified: Secondary | ICD-10-CM | POA: Diagnosis not present

## 2016-11-17 DIAGNOSIS — E1142 Type 2 diabetes mellitus with diabetic polyneuropathy: Secondary | ICD-10-CM | POA: Diagnosis not present

## 2016-11-17 DIAGNOSIS — E119 Type 2 diabetes mellitus without complications: Secondary | ICD-10-CM | POA: Diagnosis not present

## 2016-11-17 DIAGNOSIS — E1140 Type 2 diabetes mellitus with diabetic neuropathy, unspecified: Secondary | ICD-10-CM | POA: Diagnosis not present

## 2016-11-17 DIAGNOSIS — I5022 Chronic systolic (congestive) heart failure: Secondary | ICD-10-CM | POA: Diagnosis not present

## 2016-11-17 DIAGNOSIS — E782 Mixed hyperlipidemia: Secondary | ICD-10-CM | POA: Diagnosis not present

## 2016-11-17 DIAGNOSIS — Z7901 Long term (current) use of anticoagulants: Secondary | ICD-10-CM | POA: Diagnosis not present

## 2016-11-17 DIAGNOSIS — E6609 Other obesity due to excess calories: Secondary | ICD-10-CM | POA: Diagnosis not present

## 2016-11-17 DIAGNOSIS — R5381 Other malaise: Secondary | ICD-10-CM | POA: Diagnosis not present

## 2017-02-16 DIAGNOSIS — E119 Type 2 diabetes mellitus without complications: Secondary | ICD-10-CM | POA: Diagnosis not present

## 2017-02-16 DIAGNOSIS — I10 Essential (primary) hypertension: Secondary | ICD-10-CM | POA: Diagnosis not present

## 2017-02-16 DIAGNOSIS — I482 Chronic atrial fibrillation: Secondary | ICD-10-CM | POA: Diagnosis not present

## 2017-02-16 DIAGNOSIS — Z7901 Long term (current) use of anticoagulants: Secondary | ICD-10-CM | POA: Diagnosis not present

## 2017-02-17 ENCOUNTER — Encounter: Payer: Self-pay | Admitting: Cardiology

## 2017-02-17 ENCOUNTER — Encounter: Payer: Self-pay | Admitting: *Deleted

## 2017-02-17 ENCOUNTER — Ambulatory Visit (INDEPENDENT_AMBULATORY_CARE_PROVIDER_SITE_OTHER): Payer: Medicare HMO | Admitting: Cardiology

## 2017-02-17 VITALS — BP 130/70 | HR 65 | Ht 72.0 in | Wt 213.0 lb

## 2017-02-17 DIAGNOSIS — E782 Mixed hyperlipidemia: Secondary | ICD-10-CM

## 2017-02-17 DIAGNOSIS — I10 Essential (primary) hypertension: Secondary | ICD-10-CM | POA: Diagnosis not present

## 2017-02-17 DIAGNOSIS — I480 Paroxysmal atrial fibrillation: Secondary | ICD-10-CM

## 2017-02-17 DIAGNOSIS — I5022 Chronic systolic (congestive) heart failure: Secondary | ICD-10-CM | POA: Diagnosis not present

## 2017-02-17 MED ORDER — CARVEDILOL 25 MG PO TABS: 25.0000 mg | ORAL_TABLET | Freq: Two times a day (BID) | ORAL | 3 refills | Status: DC

## 2017-02-17 MED ORDER — ATORVASTATIN CALCIUM 20 MG PO TABS: ORAL_TABLET | ORAL | 1 refills | Status: DC

## 2017-02-17 MED ORDER — LOSARTAN POTASSIUM 50 MG PO TABS: 50.0000 mg | ORAL_TABLET | Freq: Every day | ORAL | 1 refills | Status: DC

## 2017-02-17 MED ORDER — DIGOXIN 125 MCG PO TABS: 0.1250 mg | ORAL_TABLET | Freq: Every day | ORAL | 3 refills | Status: DC

## 2017-02-17 MED ORDER — APIXABAN 5 MG PO TABS: 5.0000 mg | ORAL_TABLET | Freq: Two times a day (BID) | ORAL | 1 refills | Status: DC
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
## 2017-02-17 NOTE — Patient Instructions (Signed)
Your physician recommends that you schedule a follow-up appointment in: [place] has recommended you make the following change in your medication:   INCREASE LOSARTAN 50 MG DAILY  WE HAVE REFILLED YOUR MEDICATIONS   Thank you for choosing [facility]!!

## 2017-02-21 ENCOUNTER — Telehealth: Payer: Self-pay | Admitting: Cardiology

## 2017-02-21 NOTE — Telephone Encounter (Signed)
Tried to return pt call. Keep getting a fast busy signal. I will mail a copy of AVS from last office visit which shows changes.

## 2017-02-21 NOTE — Telephone Encounter (Signed)
Justin [name] called stating that on last office visit Justin [name] medications were changed. She has lost the paper That told of the changes.  Please call her home #

## 2017-02-23 ENCOUNTER — Telehealth: Payer: Self-pay | Admitting: *Deleted

## 2017-02-23 DIAGNOSIS — I10 Essential (primary) hypertension: Secondary | ICD-10-CM

## 2017-02-23 NOTE — Telephone Encounter (Signed)
Attempted to contact patient, wife currently in hospital - will mail lab orders

## 2017-02-23 NOTE — Telephone Encounter (Signed)
-----   Message from Arnoldo Lenis, MD sent at 02/20/2017  1:41 PM EDT ----- Mr Yu needs a BMET in 2 weeks since we increased his losartan, I don't believed I requested in clinic  Zandra Abts MD

## 2017-03-03 ENCOUNTER — Ambulatory Visit (INDEPENDENT_AMBULATORY_CARE_PROVIDER_SITE_OTHER): Payer: Medicare HMO | Admitting: *Deleted

## 2017-03-03 DIAGNOSIS — Z5181 Encounter for therapeutic drug level monitoring: Secondary | ICD-10-CM | POA: Diagnosis not present

## 2017-03-03 DIAGNOSIS — I4891 Unspecified atrial fibrillation: Secondary | ICD-10-CM

## 2017-03-03 NOTE — Progress Notes (Signed)
Pt was started on Eliquis 5mg  bid for atrial fib on 10/18/14 by Dr Harl Bowie.    Pt states he is tolerating Eliquis well. No s/s of increased bruising, bleeding or GI upset.  Reviewed patients medication list. Pt is not currently on any combined P-gp and strong CYP3A4 inhibitors/inducers (ketoconazole, traconazole, ritonavir, carbamazepine, phenytoin, rifampin, St. John's wort). Reviewed BMP from 02/16/17 @ [facility] 0.97 , Weight 95.7 kg, CrCl 86.33. Dose is appropriate based on 2 out of 3 criteria (age, weight, SrCr).  03/08/17: Hgb/Hct 13.6/41.5.  A full discussion of the nature of anticoagulants has been carried out. A benefit/risk analysis has been presented to the patient, so that they understand the justification for choosing anticoagulation with Eliquis at this time. The need for compliance is stressed. Pt is aware to take the medication twice daily. Side effects of potential bleeding are discussed, including unusual colored urine or stools, coughing up blood or coffee ground emesis, nose bleeds or serious fall or head trauma. Discussed signs and symptoms of stroke. The patient should avoid any OTC items containing aspirin or ibuprofen. Avoid alcohol consumption. Call if any signs of abnormal bleeding. Discussed financial obligations and resolved any difficulty in obtaining medication. Next lab test test in 6 months.    Called step daughter [name] with CBC results.  6 month f/u placed in recall per family request.

## 2017-03-08 ENCOUNTER — Other Ambulatory Visit: Payer: Self-pay | Admitting: Cardiology

## 2017-03-08 DIAGNOSIS — Z5181 Encounter for therapeutic drug level monitoring: Secondary | ICD-10-CM | POA: Diagnosis not present

## 2017-03-08 DIAGNOSIS — I4891 Unspecified atrial fibrillation: Secondary | ICD-10-CM | POA: Diagnosis not present

## 2017-03-17 ENCOUNTER — Telehealth: Payer: Self-pay | Admitting: *Deleted

## 2017-03-17 ENCOUNTER — Encounter: Payer: Self-pay | Admitting: *Deleted

## 2017-03-17 NOTE — Telephone Encounter (Signed)
-----   Message from Arnoldo Lenis, MD sent at 03/14/2017  2:52 PM EDT ----- Normal cbc  Zandra Abts MD

## 2017-03-17 NOTE — Telephone Encounter (Signed)
Unable to reach patient by phone - mailed pt results letter and forward results to pcp

## 2017-03-22 LAB — CBC
HCT: 41.5 % (ref 38.5–50.0)
HEMOGLOBIN: 13.6 g/dL (ref 13.2–17.1)
MCH: 30 pg (ref 27.0–33.0)
MCHC: 32.8 g/dL (ref 32.0–36.0)
MCV: 91.4 fL (ref 80.0–100.0)
MPV: 11 fL (ref 7.5–12.5)
PLATELETS: 325 10*3/uL (ref 140–400)
RBC: 4.54 10*6/uL (ref 4.20–5.80)
RDW: 13.4 % (ref 11.0–15.0)
WBC: 10.6 10*3/uL (ref 3.8–10.8)

## 2017-03-31 ENCOUNTER — Telehealth: Payer: Self-pay | Admitting: *Deleted

## 2017-03-31 NOTE — Telephone Encounter (Signed)
-----   Message from Arnoldo Lenis, MD sent at 03/29/2017  1:40 PM EDT ----- Normal blood counts  Zandra Abts MD

## 2017-03-31 NOTE — Telephone Encounter (Signed)
Pt aware - routed to pcp  

## 2017-04-20 DIAGNOSIS — R69 Illness, unspecified: Secondary | ICD-10-CM | POA: Diagnosis not present

## 2017-04-25 DIAGNOSIS — Z23 Encounter for immunization: Secondary | ICD-10-CM | POA: Diagnosis not present

## 2017-05-18 DIAGNOSIS — E1142 Type 2 diabetes mellitus with diabetic polyneuropathy: Secondary | ICD-10-CM | POA: Diagnosis not present

## 2017-05-18 DIAGNOSIS — Z7901 Long term (current) use of anticoagulants: Secondary | ICD-10-CM | POA: Diagnosis not present

## 2017-05-18 DIAGNOSIS — I482 Chronic atrial fibrillation: Secondary | ICD-10-CM | POA: Diagnosis not present

## 2017-05-18 DIAGNOSIS — I10 Essential (primary) hypertension: Secondary | ICD-10-CM | POA: Diagnosis not present

## 2017-05-30 DIAGNOSIS — Z7901 Long term (current) use of anticoagulants: Secondary | ICD-10-CM | POA: Diagnosis not present

## 2017-05-30 DIAGNOSIS — E1142 Type 2 diabetes mellitus with diabetic polyneuropathy: Secondary | ICD-10-CM | POA: Diagnosis not present

## 2017-05-30 DIAGNOSIS — I482 Chronic atrial fibrillation: Secondary | ICD-10-CM | POA: Diagnosis not present

## 2017-05-30 DIAGNOSIS — I10 Essential (primary) hypertension: Secondary | ICD-10-CM | POA: Diagnosis not present

## 2017-06-17 DIAGNOSIS — I10 Essential (primary) hypertension: Secondary | ICD-10-CM | POA: Diagnosis not present

## 2017-06-24 ENCOUNTER — Encounter: Payer: Self-pay | Admitting: Cardiology

## 2017-06-24 ENCOUNTER — Ambulatory Visit: Payer: Medicare HMO | Admitting: Cardiology

## 2017-06-24 ENCOUNTER — Other Ambulatory Visit: Payer: Self-pay

## 2017-06-24 ENCOUNTER — Encounter: Payer: Self-pay | Admitting: *Deleted

## 2017-06-24 VITALS — BP 167/78 | HR 69 | Ht 72.0 in | Wt 213.0 lb

## 2017-06-24 DIAGNOSIS — I4891 Unspecified atrial fibrillation: Secondary | ICD-10-CM | POA: Diagnosis not present

## 2017-06-24 DIAGNOSIS — I10 Essential (primary) hypertension: Secondary | ICD-10-CM | POA: Diagnosis not present

## 2017-06-24 DIAGNOSIS — E782 Mixed hyperlipidemia: Secondary | ICD-10-CM | POA: Diagnosis not present

## 2017-06-24 DIAGNOSIS — I5022 Chronic systolic (congestive) heart failure: Secondary | ICD-10-CM

## 2017-06-24 MED ORDER — SPIRONOLACTONE 25 MG PO TABS: 25.0000 mg | ORAL_TABLET | Freq: Every day | ORAL | 1 refills | Status: DC
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
## 2017-06-24 NOTE — Patient Instructions (Signed)
Your physician wants you to follow-up in: [place] will receive a reminder letter in the mail two months in advance. If you don't receive a letter, please call our office to schedule the follow-up appointment.  Your physician has recommended you make the following change in your medication:   START ALDACTONE 25 [place] physician recommends that you return for lab work in: [place] has requested that you regularly monitor and record your blood pressure readings at home FOR 2 [place].   Thank you for choosing [facility]!!

## 2017-07-07 ENCOUNTER — Other Ambulatory Visit: Payer: Self-pay | Admitting: *Deleted

## 2017-07-07 MED ORDER — SPIRONOLACTONE 25 MG PO TABS: 25.0000 mg | ORAL_TABLET | Freq: Every day | ORAL | 1 refills | Status: DC

## 2017-07-27 ENCOUNTER — Other Ambulatory Visit: Payer: Self-pay | Admitting: Cardiology

## 2017-07-27 ENCOUNTER — Telehealth: Payer: Self-pay

## 2017-07-27 LAB — BASIC METABOLIC PANEL WITH GFR
BUN: 15 mg/dL (ref 7–25)
CALCIUM: 9.9 mg/dL (ref 8.6–10.3)
CHLORIDE: 102 mmol/L (ref 98–110)
CO2: 25 mmol/L (ref 20–32)
CREATININE: 1.08 mg/dL (ref 0.70–1.18)
GFR, Est African American: 76 mL/min/{1.73_m2} (ref >=60)
GFR, Est Non African American: 65 mL/min/{1.73_m2} (ref >=60)
GLUCOSE: 343 mg/dL — AB (ref 65–139)
Potassium: 4.3 mmol/L (ref 3.5–5.3)
Sodium: 137 mmol/L (ref 135–146)

## 2017-07-27 NOTE — Telephone Encounter (Signed)
Patients sister contacted office to report BP readings. Patient did not check HR Patient had blood work done today   BP 174/98 BP 167/87 BP 169/103 BP 161/103 BP 169/96 BP 162/97 BP 174/97 BP 158/94 BP 174/95 BP 162/101 BP 169/88 BP 167/94 BP 159/92 BP 149/91 BP 134/78 BP 141/97

## 2017-07-28 MED ORDER — AMLODIPINE BESYLATE 5 MG PO TABS: 5.0000 mg | ORAL_TABLET | Freq: Every day | ORAL | 1 refills | Status: DC

## 2017-07-28 NOTE — Telephone Encounter (Signed)
Pt aware and voiced understanding - Norvasc sent to mail order pharmacy as requested. Pt also made aware of normal lab results

## 2017-07-28 NOTE — Telephone Encounter (Signed)
bp too high, please start norvacs 5mg  daily   Zandra Abts MD

## 2017-08-04 ENCOUNTER — Telehealth: Payer: Self-pay | Admitting: *Deleted

## 2017-08-04 NOTE — Telephone Encounter (Signed)
-----   Message from Arnoldo Lenis, MD sent at 08/03/2017  4:01 PM EST ----- Labs look ok   J branch MD

## 2017-08-04 NOTE — Telephone Encounter (Signed)
Pt aware - routed to pcp  

## 2017-08-11 ENCOUNTER — Telehealth: Payer: Self-pay | Admitting: Cardiology

## 2017-08-11 NOTE — Telephone Encounter (Signed)
Justin [name] (sister) called to report BP readings  Please call [phone]

## 2017-08-11 NOTE — Telephone Encounter (Signed)
Returned call to [employer].  She reports that pt's blood pressures are as follow: 146/78, 148/91, 118/74, 129/65, 119/66, 13575. Will forward to Dr. Harl Bowie as am FYI.

## 2017-08-12 NOTE — Telephone Encounter (Signed)
Numbers are ok, will continue current meds at this time   Zandra Abts MD

## 2017-08-12 NOTE — Telephone Encounter (Signed)
Pt's sister made aware. She will make sure pt continues same medications.

## 2017-10-26 ENCOUNTER — Other Ambulatory Visit: Payer: Self-pay

## 2017-10-26 MED ORDER — LOSARTAN POTASSIUM 100 MG PO TABS: 100.0000 mg | ORAL_TABLET | Freq: Every day | ORAL | 1 refills | Status: DC

## 2017-11-09 ENCOUNTER — Encounter (HOSPITAL_COMMUNITY): Payer: Self-pay | Admitting: Emergency Medicine

## 2017-11-09 ENCOUNTER — Inpatient Hospital Stay (HOSPITAL_COMMUNITY)
Admission: EM | Admit: 2017-11-09 | Discharge: 2017-11-10 | DRG: 638 | Disposition: A | Discharge status: Home / Self Care | Payer: Medicare HMO | Attending: Family Medicine | Admitting: Family Medicine

## 2017-11-09 ENCOUNTER — Emergency Department (HOSPITAL_COMMUNITY): Payer: Medicare HMO

## 2017-11-09 ENCOUNTER — Other Ambulatory Visit: Payer: Self-pay

## 2017-11-09 DIAGNOSIS — R402142 Coma scale, eyes open, spontaneous, at arrival to emergency department: Secondary | ICD-10-CM | POA: Diagnosis present

## 2017-11-09 DIAGNOSIS — K219 Gastro-esophageal reflux disease without esophagitis: Secondary | ICD-10-CM | POA: Diagnosis present

## 2017-11-09 DIAGNOSIS — X58XXXA Exposure to other specified factors, initial encounter: Secondary | ICD-10-CM | POA: Diagnosis present

## 2017-11-09 DIAGNOSIS — H9190 Unspecified hearing loss, unspecified ear: Secondary | ICD-10-CM | POA: Diagnosis present

## 2017-11-09 DIAGNOSIS — R402362 Coma scale, best motor response, obeys commands, at arrival to emergency department: Secondary | ICD-10-CM | POA: Diagnosis present

## 2017-11-09 DIAGNOSIS — E538 Deficiency of other specified B group vitamins: Secondary | ICD-10-CM | POA: Diagnosis present

## 2017-11-09 DIAGNOSIS — I110 Hypertensive heart disease with heart failure: Secondary | ICD-10-CM | POA: Diagnosis present

## 2017-11-09 DIAGNOSIS — D509 Iron deficiency anemia, unspecified: Secondary | ICD-10-CM | POA: Diagnosis present

## 2017-11-09 DIAGNOSIS — E11621 Type 2 diabetes mellitus with foot ulcer: Principal | ICD-10-CM | POA: Diagnosis present

## 2017-11-09 DIAGNOSIS — I482 Chronic atrial fibrillation: Secondary | ICD-10-CM | POA: Diagnosis present

## 2017-11-09 DIAGNOSIS — S90424A Blister (nonthermal), right lesser toe(s), initial encounter: Secondary | ICD-10-CM | POA: Diagnosis present

## 2017-11-09 DIAGNOSIS — I5022 Chronic systolic (congestive) heart failure: Secondary | ICD-10-CM | POA: Diagnosis present

## 2017-11-09 DIAGNOSIS — Z8719 Personal history of other diseases of the digestive system: Secondary | ICD-10-CM

## 2017-11-09 DIAGNOSIS — E785 Hyperlipidemia, unspecified: Secondary | ICD-10-CM | POA: Diagnosis present

## 2017-11-09 DIAGNOSIS — Z7984 Long term (current) use of oral hypoglycemic drugs: Secondary | ICD-10-CM

## 2017-11-09 DIAGNOSIS — Z823 Family history of stroke: Secondary | ICD-10-CM | POA: Diagnosis not present

## 2017-11-09 DIAGNOSIS — L98499 Non-pressure chronic ulcer of skin of other sites with unspecified severity: Secondary | ICD-10-CM | POA: Diagnosis present

## 2017-11-09 DIAGNOSIS — Z801 Family history of malignant neoplasm of trachea, bronchus and lung: Secondary | ICD-10-CM

## 2017-11-09 DIAGNOSIS — Z79899 Other long term (current) drug therapy: Secondary | ICD-10-CM

## 2017-11-09 DIAGNOSIS — L089 Local infection of the skin and subcutaneous tissue, unspecified: Secondary | ICD-10-CM

## 2017-11-09 DIAGNOSIS — Z9081 Acquired absence of spleen: Secondary | ICD-10-CM | POA: Diagnosis not present

## 2017-11-09 DIAGNOSIS — I10 Essential (primary) hypertension: Secondary | ICD-10-CM | POA: Diagnosis present

## 2017-11-09 DIAGNOSIS — Z8249 Family history of ischemic heart disease and other diseases of the circulatory system: Secondary | ICD-10-CM

## 2017-11-09 DIAGNOSIS — E119 Type 2 diabetes mellitus without complications: Secondary | ICD-10-CM

## 2017-11-09 DIAGNOSIS — I428 Other cardiomyopathies: Secondary | ICD-10-CM | POA: Diagnosis present

## 2017-11-09 DIAGNOSIS — Z7901 Long term (current) use of anticoagulants: Secondary | ICD-10-CM | POA: Diagnosis not present

## 2017-11-09 DIAGNOSIS — R402252 Coma scale, best verbal response, oriented, at arrival to emergency department: Secondary | ICD-10-CM | POA: Diagnosis present

## 2017-11-09 DIAGNOSIS — L97519 Non-pressure chronic ulcer of other part of right foot with unspecified severity: Secondary | ICD-10-CM | POA: Diagnosis present

## 2017-11-09 DIAGNOSIS — I4821 Permanent atrial fibrillation: Secondary | ICD-10-CM | POA: Diagnosis present

## 2017-11-09 DIAGNOSIS — L97509 Non-pressure chronic ulcer of other part of unspecified foot with unspecified severity: Secondary | ICD-10-CM | POA: Diagnosis not present

## 2017-11-09 DIAGNOSIS — Q211 Atrial septal defect: Secondary | ICD-10-CM

## 2017-11-09 DIAGNOSIS — E11628 Type 2 diabetes mellitus with other skin complications: Secondary | ICD-10-CM

## 2017-11-09 DIAGNOSIS — Z888 Allergy status to other drugs, medicaments and biological substances status: Secondary | ICD-10-CM

## 2017-11-09 LAB — BASIC METABOLIC PANEL
ANION GAP: 7 (ref 5–15)
BUN: 19 mg/dL (ref 6–20)
CO2: 22 mmol/L (ref 22–32)
Calcium: 9.9 mg/dL (ref 8.9–10.3)
Chloride: 107 mmol/L (ref 101–111)
Creatinine, Ser: 1.05 mg/dL (ref 0.61–1.24)
GLUCOSE: 102 mg/dL — AB (ref 65–99)
POTASSIUM: 5.1 mmol/L (ref 3.5–5.1)
Sodium: 136 mmol/L (ref 135–145)

## 2017-11-09 LAB — CBG MONITORING, ED
GLUCOSE-CAPILLARY: 113 mg/dL — AB (ref 65–99)
Glucose-Capillary: 68 mg/dL (ref 65–99)

## 2017-11-09 LAB — CBC WITH DIFFERENTIAL/PLATELET
BASOS ABS: 0.1 10*3/uL (ref 0.0–0.1)
BASOS PCT: 0 %
Eosinophils Absolute: 0.2 10*3/uL (ref 0.0–0.7)
Eosinophils Relative: 2 %
HCT: 34.1 % — ABNORMAL LOW (ref 39.0–52.0)
Hemoglobin: 11.2 g/dL — ABNORMAL LOW (ref 13.0–17.0)
LYMPHS PCT: 34 %
Lymphs Abs: 4.5 10*3/uL — ABNORMAL HIGH (ref 0.7–4.0)
MCH: 32 pg (ref 26.0–34.0)
MCHC: 32.8 g/dL (ref 30.0–36.0)
MCV: 97.4 fL (ref 78.0–100.0)
MONO ABS: 1.5 10*3/uL — AB (ref 0.1–1.0)
Monocytes Relative: 11 %
NEUTROS ABS: 7 10*3/uL (ref 1.7–7.7)
NEUTROS PCT: 53 %
Platelets: 334 10*3/uL (ref 150–400)
RBC: 3.5 MIL/uL — AB (ref 4.22–5.81)
RDW: 14.6 % (ref 11.5–15.5)
WBC: 13.2 10*3/uL — ABNORMAL HIGH (ref 4.0–10.5)

## 2017-11-09 LAB — GLUCOSE, CAPILLARY: GLUCOSE-CAPILLARY: 144 mg/dL — AB (ref 65–99)

## 2017-11-09 LAB — DIGOXIN LEVEL: Digoxin Level: 0.6 ng/mL — ABNORMAL LOW (ref 0.8–2.0)

## 2017-11-09 LAB — SEDIMENTATION RATE: Sed Rate: 50 mm/hr — ABNORMAL HIGH (ref 0–16)

## 2017-11-09 MED ORDER — TRAMADOL HCL 50 MG PO TABS: 50.0000 mg | ORAL_TABLET | Freq: Four times a day (QID) | ORAL | Status: DC | PRN

## 2017-11-09 MED ORDER — INSULIN ASPART 100 UNIT/ML ~~LOC~~ SOLN
0.0000 [IU] | Freq: Three times a day (TID) | SUBCUTANEOUS | Status: DC
  Administered 2017-11-10: 1 [IU] via SUBCUTANEOUS

## 2017-11-09 MED ORDER — SODIUM CHLORIDE 0.9% FLUSH
3.0000 mL | Freq: Two times a day (BID) | INTRAVENOUS | Status: DC
  Administered 2017-11-10: 3 mL via INTRAVENOUS

## 2017-11-09 MED ORDER — ACETAMINOPHEN 325 MG PO TABS: 650.0000 mg | ORAL_TABLET | Freq: Four times a day (QID) | ORAL | Status: DC | PRN

## 2017-11-09 MED ORDER — FERROUS SULFATE 325 (65 FE) MG PO TABS
325.0000 mg | ORAL_TABLET | Freq: Three times a day (TID) | ORAL | Status: DC
  Administered 2017-11-10: 325 mg via ORAL

## 2017-11-09 MED ORDER — VANCOMYCIN HCL 10 G IV SOLR
2000.0000 mg | Freq: Once | INTRAVENOUS | Status: AC
  Administered 2017-11-10: 2000 mg via INTRAVENOUS
  Filled 2017-11-09 (×2): qty 2000

## 2017-11-09 MED ORDER — VITAMIN B-12 100 MCG PO TABS
100.0000 ug | ORAL_TABLET | Freq: Every day | ORAL | Status: DC
  Administered 2017-11-10: 100 ug via ORAL
  Filled 2017-11-09: qty 1

## 2017-11-09 MED ORDER — CARVEDILOL 12.5 MG PO TABS
25.0000 mg | ORAL_TABLET | Freq: Two times a day (BID) | ORAL | Status: DC
  Administered 2017-11-10 (×2): 25 mg via ORAL
  Filled 2017-11-09 (×2): qty 2

## 2017-11-09 MED ORDER — DIGOXIN 125 MCG PO TABS
0.1250 mg | ORAL_TABLET | Freq: Every day | ORAL | Status: DC
  Administered 2017-11-10: 0.125 mg via ORAL
  Filled 2017-11-09: qty 1

## 2017-11-09 MED ORDER — ACETAMINOPHEN 650 MG RE SUPP: 650.0000 mg | Freq: Four times a day (QID) | RECTAL | Status: DC | PRN

## 2017-11-09 MED ORDER — PIPERACILLIN-TAZOBACTAM 3.375 G IVPB 30 MIN
3.3750 g | Freq: Once | INTRAVENOUS | Status: AC
  Administered 2017-11-09: 3.375 g via INTRAVENOUS
  Filled 2017-11-09: qty 50

## 2017-11-09 MED ORDER — APIXABAN 5 MG PO TABS
5.0000 mg | ORAL_TABLET | Freq: Two times a day (BID) | ORAL | Status: DC
Start: 2017-11-09 — End: 2017-11-10
  Administered 2017-11-10 (×2): 5 mg via ORAL
  Filled 2017-11-09 (×7): qty 1

## 2017-11-09 MED ORDER — INSULIN ASPART 100 UNIT/ML ~~LOC~~ SOLN: 0.0000 [IU] | Freq: Every day | SUBCUTANEOUS | Status: DC

## 2017-11-09 MED ORDER — AMLODIPINE BESYLATE 5 MG PO TABS
5.0000 mg | ORAL_TABLET | Freq: Every day | ORAL | Status: DC
  Administered 2017-11-10: 5 mg via ORAL
  Filled 2017-11-09: qty 1

## 2017-11-09 MED ORDER — SODIUM CHLORIDE 0.9% FLUSH
3.0000 mL | INTRAVENOUS | Status: DC | PRN
Start: 2017-11-09 — End: 2017-11-10

## 2017-11-09 MED ORDER — SACUBITRIL-VALSARTAN 24-26 MG PO TABS
1.0000 | ORAL_TABLET | Freq: Two times a day (BID) | ORAL | Status: DC
  Administered 2017-11-10 (×2): 1 via ORAL
  Filled 2017-11-09 (×4): qty 1

## 2017-11-09 MED ORDER — VANCOMYCIN HCL IN DEXTROSE 1-5 GM/200ML-% IV SOLN
1000.0000 mg | Freq: Two times a day (BID) | INTRAVENOUS | Status: DC
  Administered 2017-11-10: 1000 mg via INTRAVENOUS
  Filled 2017-11-09: qty 200

## 2017-11-09 MED ORDER — ATORVASTATIN CALCIUM 20 MG PO TABS: 20.0000 mg | ORAL_TABLET | Freq: Every day | ORAL | Status: DC

## 2017-11-09 MED ORDER — SPIRONOLACTONE 25 MG PO TABS
25.0000 mg | ORAL_TABLET | Freq: Every day | ORAL | Status: DC
Start: 2017-11-10 — End: 2017-11-10
  Filled 2017-11-09: qty 1

## 2017-11-09 MED ORDER — GLIPIZIDE 5 MG PO TABS
5.0000 mg | ORAL_TABLET | Freq: Two times a day (BID) | ORAL | Status: DC
  Filled 2017-11-09: qty 1

## 2017-11-09 MED ORDER — FUROSEMIDE 40 MG PO TABS
40.0000 mg | ORAL_TABLET | Freq: Every day | ORAL | Status: DC
  Filled 2017-11-09: qty 1

## 2017-11-09 MED ORDER — SODIUM CHLORIDE 0.9 % IV SOLN: 250.0000 mL | INTRAVENOUS | Status: DC | PRN

## 2017-11-09 MED ORDER — PIPERACILLIN-TAZOBACTAM 3.375 G IVPB
3.3750 g | Freq: Three times a day (TID) | INTRAVENOUS | Status: DC
  Administered 2017-11-10 (×2): 3.375 g via INTRAVENOUS
  Filled 2017-11-09 (×2): qty 50

## 2017-11-09 MED ORDER — POTASSIUM CHLORIDE CRYS ER 10 MEQ PO TBCR
10.0000 meq | EXTENDED_RELEASE_TABLET | Freq: Every day | ORAL | Status: DC
  Filled 2017-11-09 (×3): qty 1
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
## 2017-11-09 NOTE — ED Notes (Signed)
RN aware of blood glucose- meal tray to be given.

## 2017-11-09 NOTE — ED Notes (Signed)
Patietn CBG of 68. Patient given frozen meal entree and diet coke at this time.
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
## 2017-11-09 NOTE — Progress Notes (Addendum)
Pharmacy Antibiotic Note /Anticoagulation Note  Justin [name] is a 77 y.o. male admitted on 11/09/2017 with wound infection.  Pharmacy has been consulted for Vancomycin and zosyn dosing.  Plan: Vancomycin 2000mg  loading dose, then 1000mg   IV every 12 hours.  Goal trough 15-20 mcg/mL. Zosyn 3.375g IV q8h (4 hour infusion).  F/U cxs and clinical progress Monitor V/S, labs, and levels as indicated  Height: 6' (182.9 cm) Weight: 210 lb (95.3 kg) IBW/kg (Calculated) : 77.6  Temp (24hrs), Avg:97.9 F (36.6 C), Min:97.9 F (36.6 C), Max:97.9 F (36.6 C)  Recent Labs  Lab 11/09/17 1925  WBC 13.2*  CREATININE 1.05    Estimated Creatinine Clearance: 69.5 mL/min (by C-G formula based on SCr of 1.05 mg/dL).    Allergies  Allergen Reactions  . Niacin And Related     Unknown     Antimicrobials this admission: Vancomycin 5/15 >>  Zosyn 5/15 >>   Dose adjustments this admission: n/a  Microbiology results: 5/15 BCx: pending 5/15 woundCx: pending  MRSA PCR:   ADDendum: Anticoagulation: Patient on chronic eliquis for afib. No e/o bleeding  Plan: Continue Apixaban 5mg  po BID  Monitor for S/S of bleeding  Thank you for allowing pharmacy to be a part of this patient's care.  Isac Sarna, BS [place], [place] Clinical Pharmacist Pager [phone] 11/09/2017 9:40 PM

## 2017-11-09 NOTE — ED Triage Notes (Signed)
Pt noticed wound to bottom of R foot two weeks ago, was seen by Karie Kirks and given antibx. No improvement noted and was taken back to [facility] who sent him here. Noted to have red streaking to top of foot.

## 2017-11-10 ENCOUNTER — Encounter (HOSPITAL_COMMUNITY): Payer: Self-pay | Admitting: Family Medicine

## 2017-11-10 DIAGNOSIS — I482 Chronic atrial fibrillation: Secondary | ICD-10-CM

## 2017-11-10 DIAGNOSIS — L97509 Non-pressure chronic ulcer of other part of unspecified foot with unspecified severity: Secondary | ICD-10-CM

## 2017-11-10 DIAGNOSIS — E11621 Type 2 diabetes mellitus with foot ulcer: Principal | ICD-10-CM

## 2017-11-10 DIAGNOSIS — L98499 Non-pressure chronic ulcer of skin of other sites with unspecified severity: Secondary | ICD-10-CM

## 2017-11-10 DIAGNOSIS — I10 Essential (primary) hypertension: Secondary | ICD-10-CM

## 2017-11-10 LAB — GLUCOSE, CAPILLARY
Glucose-Capillary: 152 mg/dL — ABNORMAL HIGH (ref 65–99)
Glucose-Capillary: 262 mg/dL — ABNORMAL HIGH (ref 65–99)

## 2017-11-10 LAB — COMPREHENSIVE METABOLIC PANEL
ALT: 22 U/L (ref 17–63)
AST: 18 U/L (ref 15–41)
Albumin: 3.3 g/dL — ABNORMAL LOW (ref 3.5–5.0)
Alkaline Phosphatase: 44 U/L (ref 38–126)
Anion gap: 10 (ref 5–15)
BUN: 18 mg/dL (ref 6–20)
CHLORIDE: 107 mmol/L (ref 101–111)
CO2: 21 mmol/L — ABNORMAL LOW (ref 22–32)
CREATININE: 0.94 mg/dL (ref 0.61–1.24)
Calcium: 9.5 mg/dL (ref 8.9–10.3)
Glucose, Bld: 148 mg/dL — ABNORMAL HIGH (ref 65–99)
POTASSIUM: 4.4 mmol/L (ref 3.5–5.1)
Sodium: 138 mmol/L (ref 135–145)
Total Bilirubin: 0.9 mg/dL (ref 0.3–1.2)
Total Protein: 6.8 g/dL (ref 6.5–8.1)

## 2017-11-10 LAB — CBC
HCT: 35.2 % — ABNORMAL LOW (ref 39.0–52.0)
Hemoglobin: 11.2 g/dL — ABNORMAL LOW (ref 13.0–17.0)
MCH: 31 pg (ref 26.0–34.0)
MCHC: 31.8 g/dL (ref 30.0–36.0)
MCV: 97.5 fL (ref 78.0–100.0)
PLATELETS: 389 10*3/uL (ref 150–400)
RBC: 3.61 MIL/uL — AB (ref 4.22–5.81)
RDW: 14.5 % (ref 11.5–15.5)
WBC: 14.5 10*3/uL — AB (ref 4.0–10.5)

## 2017-11-10 LAB — C-REACTIVE PROTEIN: CRP: 1.5 mg/dL — ABNORMAL HIGH (ref <1.0)

## 2017-11-10 MED ORDER — SACUBITRIL-VALSARTAN 24-26 MG PO TABS: 1.0000 | ORAL_TABLET | Freq: Two times a day (BID) | ORAL | 0 refills | Status: DC

## 2017-11-10 MED ORDER — DOXYCYCLINE HYCLATE 100 MG PO CAPS: 100.0000 mg | ORAL_CAPSULE | Freq: Two times a day (BID) | ORAL | 0 refills | Status: AC

## 2017-11-10 NOTE — Consult Note (Signed)
[facility] Nurse wound consult note Reason for Consult: I&D area of right foot between great toe and second toe The consult was performed with the aid of the primary RN, Lauren, and the consult cart. Per my observations via remote cart and discussion with Lauren, there is no open wound anywhere on the right foot.  There is a heavily callused area to the plantar surface of the 1st metatarsal head, but no open area.  There is no open area between the toes in which to pack a stripping gauze.  After the I&D yesterday in the ED, Lauren reports the patient presented for inpatient care in room 317 with a 4 x 4 gauze in place.  The gauze Lauren held up had serosanginous drainage on it.  Lauren states the foot is not discolored, and there is no odor associated with the foot.  She reports there is a small amount of erythema at the base of the second toe.  Plan of care:  Cleanse the I&D area with CHG wipes.  Allow to air dry.  Place dry gauze between the great toe and second toe.  Change at least twice daily and prn soiling. Additional suggestions: Consider consulting surgical services for any type of needed surgical intervention.   I have encouraged the family (wife, sister, daughter) to set up routine appointments with a podiatrist for early recognition of concerns in this diabetic patient.  I have also discouraged them from soaking the feet due to the increased risk of infection in diabetics. Monitor the wound area(s) for worsening of condition such as: Signs/symptoms of infection,  Increase in size,  Development of or worsening of odor, Development of pain, or increased pain at the affected locations.  Notify the medical team if any of these develop.  Thank you for the consult.  Discussed plan of care with the patient and bedside nurse.  [facility] nurse will not follow at this time.  Please re-consult the [facility] team if needed.  Val Riles, RN, MSN, CWOCN, CNS-BC, pager [phone]

## 2017-11-10 NOTE — Care Management Note (Signed)
Case Management Note  Patient Details  Name: Justin [name] MRN: [id number] Date of Birth: 11-24-1940  Subjective/Objective:      Pt admitted with wound. Pt from home, lives with wife, has very involved daughter, both at bedside today.               Action/Plan: DC home today. Pt has option to f/u with podiatry or wound center. Pt/daughter has asked for referral to be sent to the [facility] in [place]. CM has faxed referral. No further CM needs noted at this time.   Expected Discharge Date:  11/10/17               Expected Discharge Plan:  Home/Self Care  In-House Referral:  NA  Discharge planning Services  CM Consult  Post Acute Care Choice:  NA Choice offered to:  NA  Status of Service:  Completed, signed off  Sherald Barge, RN 11/10/2017, 12:15 PM

## 2017-11-10 NOTE — Progress Notes (Signed)
Pt discharged, IV removed and site intact. Patient discharged with personal belongings and prescriptions.

## 2017-11-10 NOTE — Discharge Instructions (Signed)
Cleanse the area with wipes.  Allow to air dry.  Place dry gauze between the great toe and second toe.  Change at least twice daily and as needed for soiling  Please follow up with outpatient wound care center and follow up with surgeon in 2 weeks to have wound rechecked.  Please see your primary care provider in 1 week to have wound rechecked.  Please keep wound clean and dry.     Follow with Primary MD  Lemmie Evens, MD  and other consultant's as instructed your Hospitalist MD  Please get a complete blood count and chemistry panel checked by your Primary MD at your next visit, and again as instructed by your Primary MD.  Get Medicines reviewed and adjusted: Please take all your medications with you for your next visit with your Primary MD  Laboratory/radiological data: Please request your Primary MD to go over all hospital tests and procedure/radiological results at the follow up, please ask your Primary MD to get all Hospital records sent to his/her office.  In some cases, they will be blood work, cultures and biopsy results pending at the time of your discharge. Please request that your primary care M.D. follows up on these results.  Also Note the following: If you experience worsening of your admission symptoms, develop shortness of breath, life threatening emergency, suicidal or homicidal thoughts you must seek medical attention immediately by calling 911 or calling your MD immediately  if symptoms less severe.  You must read complete instructions/literature along with all the possible adverse reactions/side effects for all the Medicines you take and that have been prescribed to you. Take any new Medicines after you have completely understood and accpet all the possible adverse reactions/side effects.   Do not drive when taking Pain medications or sleeping medications (Benzodaizepines)  Do not take more than prescribed Pain, Sleep and Anxiety Medications. It is not advisable to  combine anxiety,sleep and pain medications without talking with your primary care practitioner  Special Instructions: If you have smoked or chewed Tobacco  in the last 2 yrs please stop smoking, stop any regular Alcohol  and or any Recreational drug use.  Wear Seat belts while driving.  Please note: You were cared for by a hospitalist during your hospital stay. Once you are discharged, your primary care physician will handle any further medical issues. Please note that NO REFILLS for any discharge medications will be authorized once you are discharged, as it is imperative that you return to your primary care physician (or establish a relationship with a primary care physician if you do not have one) for your post hospital discharge needs so that they can reassess your need for medications and monitor your lab values.     Wound Care, Adult Taking care of your wound properly can help to prevent pain and infection. It can also help your wound to heal more quickly. How is this treated? Wound care  Follow instructions from your health care provider about how to take care of your wound. Make sure you: ? Wash your hands with soap and water before you change the bandage (dressing). If soap and water are not available, use hand sanitizer. ? Change your dressing as told by your health care provider. ? Leave stitches (sutures), skin glue, or adhesive strips in place. These skin closures may need to stay in place for 2 weeks or longer. If adhesive strip edges start to loosen and curl up, you may trim the loose edges. Do not  remove adhesive strips completely unless your health care provider tells you to do that.  Check your wound area every day for signs of infection. Check for: ? More redness, swelling, or pain. ? More fluid or blood. ? Warmth. ? Pus or a bad smell.  Ask your health care provider if you should clean the wound with mild soap and water. Doing this may include: ? Using a clean towel to  pat the wound dry after cleaning it. Do not rub or scrub the wound. ? Applying a cream or ointment. Do this only as told by your health care provider. ? Covering the incision with a clean dressing.  Ask your health care provider when you can leave the wound uncovered. Medicines   If you were prescribed an antibiotic medicine, cream, or ointment, take or use the antibiotic as told by your health care provider. Do not stop taking or using the antibiotic even if your condition improves.  Take over-the-counter and prescription medicines only as told by your health care provider. If you were prescribed pain medicine, take it at least 30 minutes before doing any wound care or as told by your health care provider. General instructions  Return to your normal activities as told by your health care provider. Ask your health care provider what activities are safe.  Do not scratch or pick at the wound.  Keep all follow-up visits as told by your health care provider. This is important.  Eat a diet that includes protein, vitamin A, vitamin C, and other nutrient-rich foods. These help the wound heal: ? Protein-rich foods include meat, dairy, beans, nuts, and other sources. ? Vitamin A-rich foods include carrots and dark green, leafy vegetables. ? Vitamin C-rich foods include citrus, tomatoes, and other fruits and vegetables. ? Nutrient-rich foods have protein, carbohydrates, fat, vitamins, or minerals. Eat a variety of healthy foods including vegetables, fruits, and whole grains. Contact a health care provider if:  You received a tetanus shot and you have swelling, severe pain, redness, or bleeding at the injection site.  Your pain is not controlled with medicine.  You have more redness, swelling, or pain around the wound.  You have more fluid or blood coming from the wound.  Your wound feels warm to the touch.  You have pus or a bad smell coming from the wound.  You have a fever or  chills.  You are nauseous or you vomit.  You are dizzy. Get help right away if:  You have a red streak going away from your wound.  The edges of the wound open up and separate.  Your wound is bleeding and the bleeding does not stop with gentle pressure.  You have a rash.  You faint.  You have trouble breathing. This information is not intended to replace advice given to you by your health care provider. Make sure you discuss any questions you have with your health care provider. Document Released: 03/23/2008 Document Revised: 02/11/2016 Document Reviewed: 12/30/2015 Elsevier Interactive Patient Education  2017 [employer].

## 2017-11-10 NOTE — Discharge Summary (Signed)
Physician Discharge Summary  Justin [name] FUX:[id number] DOB: [birth year] DOA: 11/09/2017  PCP: Lemmie Evens, MD Cardiologist: Dr. Harl Bowie  Admit date: 11/09/2017 Discharge date: 11/10/2017  Admitted From: Home  Disposition: Home  Recommendations for Outpatient Follow-up:  1. Follow up with PCP in 1 weeks 2. Follow up with general surgery in 2 weeks for wound check 3. Follow up with outpatient wound care center 4. Follow up with cardiologist in 2 months 5. Follow up final culture results on outpatient follow up.  Discharge Condition: STABLE   CODE STATUS: FULL    Brief Hospitalization Summary: Please see all hospital notes, images, labs for full details of the hospitalization. HPI:   Justin [name]  is a 77 y.o. male, w hypertension, hyperlipidemia, Dm2, chronic Afib, CHF (EF 25-30%) presents with c/o skin ulcer on the bottom of the right foot for several weeks, not improving and then over the past several days apparently developed a blister adjacent to the skin ulcer.    In ED,  Pt had blister popped by ED, and culture taken. ED uncomfortable sending patient home on oral abx.   Xray R foot IMPRESSION: Second metatarsal cortical thickening associated with stress injury.  Na 136, K 5.1, Bun 19, Creatinine 1.05 Wbc 13.2, hgb 11.2, Plt 334 ESR 50 Digoxin 0.6  Pt  admitted for skin ulcer and blister, celllulitis.    Skin ulcer/ cellulitis Wound culture pending, no growth to date, follow up outpatient Blood culture x 2 no growth to date The patient was admitted and started on vancomycin and zosyn IV.  However when I reassessed the patient the next morning I did not find any active signs of cellulitis or active infection.  The antibiotics were de-escalated to doxycycline oral tablets twice daily.  There was no drainage from the wound.  The wound care nurse evaluated the wound and left wound care instructions.  I could not find a reason the patient needed inpatient care and this  needs to be followed and managed outpatient.  We will discharge him home with outpatient follow-up with PCP we will try and make arrangements for him to also be seen by the outpatient wound care center and also make an outpatient appointment for him to see general surgery to evaluate the wound in 1 to 2 weeks for the need of possible debridement.  Dm2 fsbs ac and qhs, ISS  CHF (EF 25-30%) Cont carvedilol 24m po bid DC losartan He was started on Entresto on admission.  The patient seems to be tolerating it well.  His blood pressure is stable and controlled.  Will have him  Follow up with his PCP and cardiologist.  Cont spironolactone 230mpo qday Cont Lasix 4033mo qday   Chronic Afib  Cont digoxin Cont Eliquis  Hypertension Continue Amlodipine 5mg54m daily and entresto  Dm2 DC ACTOS due to hx of CHF DC Metformin due to hx of CHF Cont Glipizide 5mg 71mbid Discuss starting Jardiance 10mg 41mday (benefit in CHF) with PCP, did not start in hospital CBG (last 3)  Recent Labs    11/09/17 2234 11/09/17 2359 11/10/17 0754  GLUCAP 68 144* 152*   Anemia Stable, follow up outpatient with PCP  DVT Prophylaxis Eliquis-  AM Labs Ordered, also please review Full Orders  Family Communication: Admission, patients condition and plan of care including tests being ordered have been discussed with the patient  who indicate understanding and agree with the plan and Code Status.  Code Status FULL CODE  Likely DC to   home  Condition GUARDED    Consults called: none  Admission status: inpatient   Discharge Diagnoses:  Principal Problem:   Skin ulcer ([place]) Active Problems:   Diabetes mellitus ([place])   Permanent atrial fibrillation ([facility])   HTN (hypertension)  Discharge Instructions: Discharge Instructions    (HEART FAILURE PATIENTS) Call MD:  Anytime you have any of the following symptoms: 1) 3 pound weight gain in 24 hours or 5 pounds in 1 week 2) shortness of  breath, with or without a dry hacking cough 3) swelling in the hands, feet or stomach 4) if you have to sleep on extra pillows at night in order to breathe.   Complete by:  As directed    Call MD for:  difficulty breathing, headache or visual disturbances   Complete by:  As directed    Call MD for:  extreme fatigue   Complete by:  As directed    Call MD for:  persistant dizziness or light-headedness   Complete by:  As directed    Call MD for:  redness, tenderness, or signs of infection (pain, swelling, redness, odor or green/yellow discharge around incision site)   Complete by:  As directed    Diet - low sodium heart healthy   Complete by:  As directed    Increase activity slowly   Complete by:  As directed      Allergies as of 11/10/2017      Reactions   Niacin And Related    Unknown       Medication List    STOP taking these medications   cefUROXime 500 MG tablet Commonly known as:  CEFTIN   losartan 100 MG tablet Commonly known as:  COZAAR     TAKE these medications   Acetaminophen 500 MG coapsule Take 500 mg by mouth every 4 (four) hours as needed for fever or pain.   amLODipine 5 MG tablet Commonly known as:  NORVASC Take 1 tablet (5 mg total) by mouth daily.   apixaban 5 MG Tabs tablet Commonly known as:  ELIQUIS Take 1 tablet (5 mg total) by mouth 2 (two) times daily.   atorvastatin 20 MG tablet Commonly known as:  LIPITOR TAKE ONE TABLET BY MOUTH ONCE DAILY AT 6PM (STOP GEMFIBROZIL)   carvedilol 25 MG tablet Commonly known as:  COREG Take 1 tablet (25 mg total) by mouth 2 (two) times daily.   digoxin 0.125 MG tablet Commonly known as:  LANOXIN Take 1 tablet (0.125 mg total) by mouth daily.   doxycycline 100 MG capsule Commonly known as:  VIBRAMYCIN Take 1 capsule (100 mg total) by mouth 2 (two) times daily for 10 days.   ferrous sulfate 325 (65 FE) MG EC tablet Take 325 mg by mouth daily.   furosemide 40 MG tablet Commonly known as:  LASIX Take  40 mg by mouth daily as needed for fluid.   glipiZIDE 10 MG tablet Commonly known as:  GLUCOTROL Take 0.5 tablets (5 mg total) by mouth 2 (two) times daily before a meal. Take adjusted dose of glipizide until follow up with PCP in 5 days   metFORMIN 500 MG 24 hr tablet Commonly known as:  GLUCOPHAGE-XR Take 2 tablets (1,000 mg total) by mouth 2 (two) times daily. Stop this medication until you follow up with PCP in 5 days What changed:  additional instructions   pioglitazone 15 MG tablet Commonly known as:  ACTOS Take 1 tablet (15 mg total) by  mouth daily. Please hold this medication until follow up with PCP in 5 days. What changed:  additional instructions   potassium chloride 10 MEQ tablet Commonly known as:  K-DUR Take 10 mEq by mouth daily as needed (only takes when Lasix is taken).   sacubitril-valsartan 24-26 MG Commonly known as:  ENTRESTO Take 1 tablet by mouth 2 (two) times daily.   spironolactone 25 MG tablet Commonly known as:  ALDACTONE Take 1 tablet (25 mg total) by mouth daily.   vitamin B-12 100 MCG tablet Commonly known as:  CYANOCOBALAMIN Take 100 mcg by mouth daily.      Follow-up Information    Lemmie Evens, MD. Schedule an appointment as soon as possible for a visit in 1 week(s).   Specialty:  Family Medicine Why:  Hospital Follow Up  Contact information: [place] [address] [phone]        Virl Cagey, MD. Schedule an appointment as soon as possible for a visit in 2 week(s).   Specialty:  General Surgery Why:  Hospital Follow Up, evaluate foot ulcer wound Contact information: [id number] [place] [facility] [address] [phone]          Allergies  Allergen Reactions  . Niacin And Related     Unknown    Allergies as of 11/10/2017      Reactions   Niacin And Related    Unknown       Medication List    STOP taking these medications   cefUROXime 500 MG tablet Commonly known as:  CEFTIN    losartan 100 MG tablet Commonly known as:  COZAAR     TAKE these medications   Acetaminophen 500 MG coapsule Take 500 mg by mouth every 4 (four) hours as needed for fever or pain.   amLODipine 5 MG tablet Commonly known as:  NORVASC Take 1 tablet (5 mg total) by mouth daily.   apixaban 5 MG Tabs tablet Commonly known as:  ELIQUIS Take 1 tablet (5 mg total) by mouth 2 (two) times daily.   atorvastatin 20 MG tablet Commonly known as:  LIPITOR TAKE ONE TABLET BY MOUTH ONCE DAILY AT 6PM (STOP GEMFIBROZIL)   carvedilol 25 MG tablet Commonly known as:  COREG Take 1 tablet (25 mg total) by mouth 2 (two) times daily.   digoxin 0.125 MG tablet Commonly known as:  LANOXIN Take 1 tablet (0.125 mg total) by mouth daily.   doxycycline 100 MG capsule Commonly known as:  VIBRAMYCIN Take 1 capsule (100 mg total) by mouth 2 (two) times daily for 10 days.   ferrous sulfate 325 (65 FE) MG EC tablet Take 325 mg by mouth daily.   furosemide 40 MG tablet Commonly known as:  LASIX Take 40 mg by mouth daily as needed for fluid.   glipiZIDE 10 MG tablet Commonly known as:  GLUCOTROL Take 0.5 tablets (5 mg total) by mouth 2 (two) times daily before a meal. Take adjusted dose of glipizide until follow up with PCP in 5 days   metFORMIN 500 MG 24 hr tablet Commonly known as:  GLUCOPHAGE-XR Take 2 tablets (1,000 mg total) by mouth 2 (two) times daily. Stop this medication until you follow up with PCP in 5 days What changed:  additional instructions   pioglitazone 15 MG tablet Commonly known as:  ACTOS Take 1 tablet (15 mg total) by mouth daily. Please hold this medication until follow up with PCP in 5 days. What changed:  additional instructions  potassium chloride 10 MEQ tablet Commonly known as:  K-DUR Take 10 mEq by mouth daily as needed (only takes when Lasix is taken).   sacubitril-valsartan 24-26 MG Commonly known as:  ENTRESTO Take 1 tablet by mouth 2 (two) times daily.    spironolactone 25 MG tablet Commonly known as:  ALDACTONE Take 1 tablet (25 mg total) by mouth daily.   vitamin B-12 100 MCG tablet Commonly known as:  CYANOCOBALAMIN Take 100 mcg by mouth daily.       Procedures/Studies: Dg Foot Complete Right  Result Date: 11/09/2017 CLINICAL DATA:  Second metatarsal pain and swelling, remote injury. EXAM: RIGHT FOOT COMPLETE - 3+ VIEW COMPARISON:  None. FINDINGS: There is no evidence of fracture or dislocation. Mild cortical thickening second metatarsus. Moderate plantar calcaneal spur. Calcifications along plantar there is no evidence of arthropathy or other focal bone abnormality. The dorsal midfoot soft tissue swelling without subcutaneous gas or radiopaque foreign bodies. IMPRESSION: Second metatarsal cortical thickening associated with stress injury. Electronically Signed   By: Elon Alas M.D.   On: 11/09/2017 21:12      Subjective: Pt without complaints.    Discharge Exam: Vitals:   11/09/17 2313 11/10/17 0430  BP: (!) 144/63 120/68  Pulse: 93 93  Resp: 17 (!) 8  Temp: 97.8 F (36.6 C) 99.1 F (37.3 C)  SpO2: 100% 99%   Vitals:   11/09/17 2230 11/09/17 2313 11/10/17 0430 11/10/17 0600  BP: (!) 146/80 (!) 144/63 120/68   Pulse: 70 93 93   Resp: 17 17 (!) 8   Temp:  97.8 F (36.6 C) 99.1 F (37.3 C)   TempSrc:  Oral Oral   SpO2: 98% 100% 99%   Weight:    93.6 kg (206 lb 5.6 oz)  Height:  6' (1.829 m)     General: Pt is HOH, but alert, awake, not in acute distress Cardiovascular: normal S1/S2 +, no rubs, no gallops Respiratory: CTA bilaterally, no wheezing, no rhonchi Abdominal: Soft, NT, ND, bowel sounds + Extremities: no edema, no cyanosis, right foot wound, large callous seen no drainage, no erythema, no s/s of infection or cellulitis seen, large blister noted. No bullous lesion seen.  Good pedal pulses bilateral.    The results of significant diagnostics from this hospitalization (including imaging, microbiology,  ancillary and laboratory) are listed below for reference.     Microbiology: Recent Results (from the past 240 hour(s))  Culture, blood (routine x 2)     Status: None (Preliminary result)   Collection Time: 11/09/17  7:28 PM  Result Value Ref Range Status   Specimen Description LEFT ANTECUBITAL  Final   Special Requests   Final    BOTTLES DRAWN AEROBIC ONLY Blood Culture adequate volume   Culture   Final    NO GROWTH < 12 HOURS Performed at [facility], [address] [place], [place] [address]    Report Status PENDING  Incomplete  Culture, blood (routine x 2)     Status: None (Preliminary result)   Collection Time: 11/09/17  7:28 PM  Result Value Ref Range Status   Specimen Description RIGHT ANTECUBITAL  Final   Special Requests   Final    BOTTLES DRAWN AEROBIC ONLY Blood Culture adequate volume   Culture   Final    NO GROWTH < 12 HOURS Performed at [facility], [address], [place],  [address]    Report Status PENDING  Incomplete     Labs: BNP (last 3 results) No results  for input(s): BNP in the last 8760 hours. Basic Metabolic Panel: Recent Labs  Lab 11/09/17 1925 11/10/17 0508  NA 136 138  K 5.1 4.4  CL 107 107  CO2 22 21*  GLUCOSE 102* 148*  BUN 19 18  CREATININE 1.05 0.94  CALCIUM 9.9 9.5   Liver Function Tests: Recent Labs  Lab 11/10/17 0508  AST 18  ALT 22  ALKPHOS 44  BILITOT 0.9  PROT 6.8  ALBUMIN 3.3*   No results for input(s): LIPASE, AMYLASE in the last 168 hours. No results for input(s): AMMONIA in the last 168 hours. CBC: Recent Labs  Lab 11/09/17 1925 11/10/17 0508  WBC 13.2* 14.5*  NEUTROABS 7.0  --   HGB 11.2* 11.2*  HCT 34.1* 35.2*  MCV 97.4 97.5  PLT 334 389   Cardiac Enzymes: No results for input(s): CKTOTAL, CKMB, CKMBINDEX, TROPONINI in the last 168 hours. BNP: Invalid input(s): POCBNP CBG: Recent Labs  Lab 11/09/17 1642 11/09/17 2234 11/09/17 2359 11/10/17 0754  GLUCAP 113* 68 144* 152*   D-Dimer No  results for input(s): DDIMER in the last 72 hours. Hgb A1c No results for input(s): HGBA1C in the last 72 hours. Lipid Profile No results for input(s): CHOL, HDL, LDLCALC, TRIG, CHOLHDL, LDLDIRECT in the last 72 hours. Thyroid function studies No results for input(s): TSH, T4TOTAL, T3FREE, THYROIDAB in the last 72 hours.  Invalid input(s): FREET3 Anemia work up No results for input(s): VITAMINB12, FOLATE, FERRITIN, TIBC, IRON, RETICCTPCT in the last 72 hours. Urinalysis    Component Value Date/Time   COLORURINE YELLOW 08/20/2015 2030   APPEARANCEUR CLEAR 08/20/2015 2030   LABSPEC 1.020 08/20/2015 2030   PHURINE 5.5 08/20/2015 2030   GLUCOSEU 100 (A) 08/20/2015 2030   [place] NEGATIVE 08/20/2015 2030   [place] NEGATIVE 08/20/2015 2030   [place] 08/20/2015 2030   PROTEINUR NEGATIVE 08/20/2015 2030   UROBILINOGEN 0.2 03/24/2012 1852   NITRITE NEGATIVE 08/20/2015 2030   LEUKOCYTESUR NEGATIVE 08/20/2015 2030   Sepsis Labs Invalid input(s): PROCALCITONIN,  WBC,  LACTICIDVEN Microbiology Recent Results (from the past 240 hour(s))  Culture, blood (routine x 2)     Status: None (Preliminary result)   Collection Time: 11/09/17  7:28 PM  Result Value Ref Range Status   Specimen Description LEFT ANTECUBITAL  Final   Special Requests   Final    BOTTLES DRAWN AEROBIC ONLY Blood Culture adequate volume   Culture   Final    NO GROWTH < 12 HOURS Performed at [facility], [address], [place], [place] [address]    Report Status PENDING  Incomplete  Culture, blood (routine x 2)     Status: None (Preliminary result)   Collection Time: 11/09/17  7:28 PM  Result Value Ref Range Status   Specimen Description RIGHT ANTECUBITAL  Final   Special Requests   Final    BOTTLES DRAWN AEROBIC ONLY Blood Culture adequate volume   Culture   Final    NO GROWTH < 12 HOURS Performed at [facility], [address], [place], [place] [address]    Report Status PENDING  Incomplete   Time  coordinating discharge:   SIGNED:  Irwin Brakeman, MD  [facility] 11/10/2017, 10:48 AM Pager [phone]  If 7PM-7AM, please contact night-coverage [email or web address] Password [email or web address]

## 2017-11-12 LAB — AEROBIC CULTURE  (SUPERFICIAL SPECIMEN): SPECIAL REQUESTS: NORMAL

## 2017-11-12 LAB — AEROBIC CULTURE W GRAM STAIN (SUPERFICIAL SPECIMEN)

## 2017-11-14 LAB — CULTURE, BLOOD (ROUTINE X 2)
Culture: NO GROWTH
Culture: NO GROWTH
SPECIAL REQUESTS: ADEQUATE
SPECIAL REQUESTS: ADEQUATE

## 2017-12-02 ENCOUNTER — Telehealth: Payer: Self-pay

## 2017-12-02 NOTE — Telephone Encounter (Signed)
Patient was discharged from [facility] with entresto and told to stop taking losartan. Patient would like to know if he is to continue the entresto? Patient only has 1 week of pills left

## 2017-12-02 NOTE — Telephone Encounter (Signed)
I would stay on entresto and stop losartan if he is able to get entresto with his insurance   JBranch MD

## 2017-12-05 MED ORDER — SACUBITRIL-VALSARTAN 24-26 MG PO TABS: 1.0000 | ORAL_TABLET | Freq: Two times a day (BID) | ORAL | 1 refills | Status: DC

## 2017-12-05 NOTE — Telephone Encounter (Signed)
Phone number on file has been disconnected as well as DPR phone number

## 2017-12-05 NOTE — Telephone Encounter (Signed)
Patient and son notified.

## 2017-12-07 ENCOUNTER — Other Ambulatory Visit: Payer: Self-pay | Admitting: Cardiology

## 2017-12-09 ENCOUNTER — Telehealth: Payer: Self-pay | Admitting: *Deleted

## 2017-12-09 ENCOUNTER — Other Ambulatory Visit: Payer: Self-pay | Admitting: *Deleted

## 2017-12-09 MED ORDER — LOSARTAN POTASSIUM 100 MG PO TABS: 100.0000 mg | ORAL_TABLET | Freq: Every day | ORAL | 1 refills | Status: DC

## 2017-12-09 NOTE — Telephone Encounter (Signed)
Can stop entresto, start losartan 100mg  daily   Zandra Abts MD

## 2017-12-09 NOTE — Telephone Encounter (Signed)
[name] made aware - updated medication list

## 2017-12-09 NOTE — Telephone Encounter (Signed)
Pt sister and wife requesting that pt switch back to Losartan and stop Entresto ($70 more a month) after insurance prior authorization

## 2017-12-27 ENCOUNTER — Ambulatory Visit: Payer: Medicare HMO | Admitting: Cardiology
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
## 2017-12-28 ENCOUNTER — Encounter: Payer: Self-pay | Admitting: Cardiology

## 2018-02-03 ENCOUNTER — Telehealth: Payer: Self-pay | Admitting: Cardiology

## 2018-02-03 ENCOUNTER — Other Ambulatory Visit: Payer: Self-pay

## 2018-02-03 ENCOUNTER — Ambulatory Visit: Payer: Medicare HMO | Admitting: Cardiology

## 2018-02-03 ENCOUNTER — Encounter: Payer: Self-pay | Admitting: *Deleted

## 2018-02-03 ENCOUNTER — Encounter: Payer: Self-pay | Admitting: Cardiology

## 2018-02-03 VITALS — BP 119/72 | HR 71 | Ht 72.0 in | Wt 214.0 lb

## 2018-02-03 DIAGNOSIS — I10 Essential (primary) hypertension: Secondary | ICD-10-CM

## 2018-02-03 DIAGNOSIS — I4891 Unspecified atrial fibrillation: Secondary | ICD-10-CM | POA: Diagnosis not present

## 2018-02-03 DIAGNOSIS — I5022 Chronic systolic (congestive) heart failure: Secondary | ICD-10-CM | POA: Diagnosis not present

## 2018-02-03 DIAGNOSIS — E782 Mixed hyperlipidemia: Secondary | ICD-10-CM | POA: Diagnosis not present

## 2018-02-03 NOTE — Telephone Encounter (Signed)
°  Precert needed for: Echo- chronic systolic heart failure  Location: [facility]     Date: Feb 17, 2018

## 2018-02-03 NOTE — Patient Instructions (Signed)
Medication Instructions:  Your physician recommends that you continue on your current medications as directed. Please refer to the Current Medication list given to you today.  Labwork: NONE  Testing/Procedures: Your physician has requested that you have an echocardiogram. Echocardiography is a painless test that uses sound waves to create images of your heart. It provides your doctor with information about the size and shape of your heart and how well your heart's chambers and valves are working. This procedure takes approximately one hour. There are no restrictions for this procedure.   Follow-Up: Your physician wants you to follow-up in: 6 MONTHS WITH DR. BRANCH You will receive a reminder letter in the mail two months in advance. If you don't receive a letter, please call our office to schedule the follow-up appointment.  Any Other Special Instructions Will Be Listed Below (If Applicable).  If you need a refill on your cardiac medications before your next appointment, please call your pharmacy. 
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
## 2018-02-17 ENCOUNTER — Other Ambulatory Visit: Payer: Self-pay

## 2018-02-17 ENCOUNTER — Ambulatory Visit (INDEPENDENT_AMBULATORY_CARE_PROVIDER_SITE_OTHER): Payer: Medicare HMO

## 2018-02-17 DIAGNOSIS — I5022 Chronic systolic (congestive) heart failure: Secondary | ICD-10-CM | POA: Diagnosis not present

## 2018-02-20 ENCOUNTER — Telehealth: Payer: Self-pay | Admitting: *Deleted

## 2018-02-20 NOTE — Telephone Encounter (Signed)
-----   Message from Arnoldo Lenis, MD sent at 02/20/2018  1:57 PM EDT ----- Echo shows heart function has improved, pumping function is back into normal range. Continue current meds   Zandra Abts MD

## 2018-02-20 NOTE — Telephone Encounter (Signed)
Pt aware - routed to pcp  

## 2018-02-21 ENCOUNTER — Telehealth: Payer: Self-pay | Admitting: Cardiology

## 2018-02-21 NOTE — Telephone Encounter (Signed)
LMTCB

## 2018-02-21 NOTE — Telephone Encounter (Signed)
Called for Mrs. Gilbo.   Mrs Dobrowski told her that someone had called yesterday to give results of Gurshan test and that she did not understand and wanted them explained to [facility]

## 2018-02-21 NOTE — Telephone Encounter (Signed)
Justin [name] pt sister made aware as well as per pt and pt wife Justin [name] requested

## 2018-04-26 ENCOUNTER — Other Ambulatory Visit: Payer: Self-pay | Admitting: Cardiology

## 2018-05-15 ENCOUNTER — Other Ambulatory Visit: Payer: Self-pay | Admitting: *Deleted

## 2018-05-15 MED ORDER — APIXABAN 5 MG PO TABS: 5.0000 mg | ORAL_TABLET | Freq: Two times a day (BID) | ORAL | 1 refills | Status: DC

## 2018-05-23 ENCOUNTER — Telehealth: Payer: Self-pay | Admitting: Cardiology

## 2018-05-23 ENCOUNTER — Other Ambulatory Visit: Payer: Self-pay | Admitting: Cardiology

## 2018-05-23 MED ORDER — APIXABAN 5 MG PO TABS: 5.0000 mg | ORAL_TABLET | Freq: Two times a day (BID) | ORAL | 0 refills | Status: DC

## 2018-05-23 NOTE — Telephone Encounter (Signed)
Patient calling the office for samples of medication:   1.  What medication and dosage are you requesting samples for?  (ELI(ELIQUIS) 5 MG TABS tablet  QUIS) 5 MG TABS tablet    2.  Are you currently out of this medication?  Yes   Please call Audry Pili (step-son)    [phone]

## 2018-05-23 NOTE — Telephone Encounter (Signed)
Ricky Fagge (step-son) notified samples available.  States his wife Silva Bandy) will pick up.

## 2018-06-19 ENCOUNTER — Telehealth: Payer: Self-pay | Admitting: Cardiology

## 2018-06-19 MED ORDER — APIXABAN 5 MG PO TABS: 5.0000 mg | ORAL_TABLET | Freq: Two times a day (BID) | ORAL | 0 refills | Status: DC

## 2018-06-19 NOTE — Telephone Encounter (Signed)
Patient calling the office for samples of medication:   1.  What medication and dosage are you requesting samples for?   (ELI(ELIQUIS) 5 MG TABS tablet  QUIS) 5 MG TABS tablet   2.  Are you currently out of this medication?    Please call Normajean Glasgow [phone]

## 2018-06-19 NOTE — Telephone Encounter (Signed)
Ricky notified - samples ready for pick up.   

## 2018-07-03 ENCOUNTER — Telehealth: Payer: Self-pay | Admitting: Cardiology

## 2018-07-03 NOTE — Telephone Encounter (Signed)
Patient calling the office for samples of medication:   1.  What medication and dosage are you requesting samples for?  (ELIQUIS) 5 MG TABS tablet  QUIS) 5 MG TABS tablet  2.  Are you currently out of this medication? No  Please call [phone]   [name]) sister

## 2018-07-03 NOTE — Telephone Encounter (Signed)
Pt will have sister come by tomorrow to pick up

## 2018-08-03 ENCOUNTER — Ambulatory Visit: Payer: Medicare HMO | Admitting: Cardiology

## 2018-08-03 ENCOUNTER — Encounter: Payer: Self-pay | Admitting: *Deleted

## 2018-08-03 ENCOUNTER — Encounter: Payer: Self-pay | Admitting: Cardiology

## 2018-08-03 VITALS — BP 92/54 | HR 66 | Ht 72.0 in | Wt 204.8 lb

## 2018-08-03 DIAGNOSIS — I5022 Chronic systolic (congestive) heart failure: Secondary | ICD-10-CM | POA: Diagnosis not present

## 2018-08-03 DIAGNOSIS — I4891 Unspecified atrial fibrillation: Secondary | ICD-10-CM | POA: Diagnosis not present

## 2018-08-03 DIAGNOSIS — E782 Mixed hyperlipidemia: Secondary | ICD-10-CM

## 2018-08-03 DIAGNOSIS — I10 Essential (primary) hypertension: Secondary | ICD-10-CM

## 2018-08-03 MED ORDER — AMLODIPINE BESYLATE 2.5 MG PO TABS: 2.5000 mg | ORAL_TABLET | Freq: Every day | ORAL | 3 refills | Status: DC
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
## 2018-08-03 NOTE — Patient Instructions (Signed)
Medication Instructions:   Decrease Norvasc to 2.5mg  daily.   Continue all other medications.    Labwork:   Testing/Procedures:   Follow-Up: Your physician wants you to follow up in: 6 months.  You will receive a reminder letter in the mail one-two months in advance.  If you don't receive a letter, please call our office to schedule the follow up appointment   Any Other Special Instructions Will Be Listed Below (If Applicable).  If you need a refill on your cardiac medications before your next appointment, please call your pharmacy.

## 2018-08-22 ENCOUNTER — Telehealth: Payer: Self-pay | Admitting: Cardiology

## 2018-08-22 MED ORDER — APIXABAN 5 MG PO TABS: 5.0000 mg | ORAL_TABLET | Freq: Two times a day (BID) | ORAL | 0 refills | Status: DC

## 2018-08-22 NOTE — Telephone Encounter (Signed)
Pt son will come by and pick up samples

## 2018-08-22 NOTE — Telephone Encounter (Signed)
Patient calling the office for samples of medication:   1.  What medication and dosage are you requesting samples for?   ELIQUIS) 5 MG TABS tablet    2.  Are you currently out of this medication?  No  Please call [phone]

## 2018-09-18 ENCOUNTER — Telehealth: Payer: Self-pay | Admitting: Cardiology

## 2018-09-18 MED ORDER — APIXABAN 5 MG PO TABS: 5.0000 mg | ORAL_TABLET | Freq: Two times a day (BID) | ORAL | 0 refills | Status: DC

## 2018-09-18 NOTE — Telephone Encounter (Signed)
Patient calling the office for samples of medication:   1.What medicationand dosage are you requesting samples for?apixaban (ELIQUIS) 5 MG TABS tablet    2.Are you currentlyout of this medication? 

## 2018-09-18 NOTE — Telephone Encounter (Signed)
Pt son will come by office to pick up samples - was screened over the phone for exposure/symptoms of COVID -19

## 2018-09-20 ENCOUNTER — Telehealth: Payer: Self-pay | Admitting: Cardiology

## 2018-09-20 MED ORDER — LOSARTAN POTASSIUM 25 MG PO TABS: 25.0000 mg | ORAL_TABLET | Freq: Every day | ORAL | 1 refills | Status: DC

## 2018-09-20 NOTE — Telephone Encounter (Signed)
Pt BP for the last few days recorded were 83/45, 91/ 54, 112/55, 106/49 - was seen via televisit at Dr Karie Kirks office and was told to contact us regarding BP - LOV here amlodipine was decreased to 2.5 mg daily which pt has been taking as directed - pt says he gets dizzy when he first stands up but denies any other symptoms

## 2018-09-20 NOTE — Telephone Encounter (Signed)
Pt sister [name] made aware of changes and voiced understanding - will update [place] on BP next week - updated medication list

## 2018-09-20 NOTE — Telephone Encounter (Signed)
Have him stop norvasc and lower losartan to 25mg  daily. Be sure his wife or sister is aware of changes. Update [place] on bp's early next week   J Carmon Sahli MD

## 2018-09-20 NOTE — Telephone Encounter (Signed)
[place] (sister) called stating that patient's BP continues to stay low. Saw PCP 09-19-2018 . He was told to contact Dr. Harl Bowie.  Please call # [phone].

## 2018-10-02 ENCOUNTER — Telehealth: Payer: Self-pay | Admitting: Cardiology

## 2018-10-02 NOTE — Telephone Encounter (Signed)
Pt sister called to give BP readings - pt denies any symptoms - sister says he is doing well   94/52 106/54 114/54 108/56 100/56 102/49 113/50 90/58 105/55 106/46

## 2018-10-02 NOTE — Telephone Encounter (Signed)
BP's look ok, if notice they are getting consistnetly in the low 90s or having numbers in the 80s call us back   Zandra Abts MD

## 2018-10-02 NOTE — Telephone Encounter (Signed)
Patient's sister calling to report BP readings per JB request/tg

## 2018-10-02 NOTE — Telephone Encounter (Signed)
Left detailed message on sisters [name]) VM as requested

## 2018-10-05 ENCOUNTER — Telehealth: Payer: Self-pay | Admitting: Cardiology

## 2018-10-05 MED ORDER — ATORVASTATIN CALCIUM 20 MG PO TABS: ORAL_TABLET | ORAL | 1 refills | Status: DC

## 2018-10-05 MED ORDER — LOSARTAN POTASSIUM 25 MG PO TABS: 25.0000 mg | ORAL_TABLET | Freq: Every day | ORAL | 1 refills | Status: DC

## 2018-10-05 MED ORDER — APIXABAN 5 MG PO TABS: 5.0000 mg | ORAL_TABLET | Freq: Two times a day (BID) | ORAL | 0 refills | Status: DC

## 2018-10-05 MED ORDER — SPIRONOLACTONE 25 MG PO TABS: 25.0000 mg | ORAL_TABLET | Freq: Every day | ORAL | 1 refills | Status: DC

## 2018-10-05 MED ORDER — CARVEDILOL 25 MG PO TABS: 25.0000 mg | ORAL_TABLET | Freq: Two times a day (BID) | ORAL | 1 refills | Status: DC

## 2018-10-05 MED ORDER — DIGOXIN 125 MCG PO TABS: 0.1250 mg | ORAL_TABLET | Freq: Every day | ORAL | 1 refills | Status: DC

## 2018-10-05 NOTE — Telephone Encounter (Signed)
Audry Pili (son) called in regards to his father's medications. States that all prescriptions need to be submitted to [facility] for home delivery.  Please call [phone] (ricky-son)

## 2018-10-05 NOTE — Telephone Encounter (Signed)
Son Justin [name] informed that all cardiac medications have been sent to pharmacy.

## 2018-10-12 ENCOUNTER — Telehealth: Payer: Self-pay | Admitting: Cardiology

## 2018-10-12 NOTE — Telephone Encounter (Signed)
Patient calling the office for samples of medication:   1.What medicationand dosage are you requesting samples for?apixaban (ELIQUIS) 5 MG TABS tablet    2.Are you currentlyout of this medication? 

## 2018-10-12 NOTE — Telephone Encounter (Signed)
samples given, see log book

## 2019-01-27 HISTORY — PX: EAR BIOPSY: SHX1480

## 2019-01-30 ENCOUNTER — Other Ambulatory Visit: Payer: Self-pay | Admitting: Cardiology

## 2019-02-27 ENCOUNTER — Other Ambulatory Visit: Payer: Self-pay | Admitting: Cardiology

## 2019-03-09 ENCOUNTER — Ambulatory Visit (INDEPENDENT_AMBULATORY_CARE_PROVIDER_SITE_OTHER): Payer: Medicare HMO | Admitting: Cardiology

## 2019-03-09 ENCOUNTER — Other Ambulatory Visit: Payer: Self-pay

## 2019-03-09 ENCOUNTER — Encounter: Payer: Self-pay | Admitting: *Deleted

## 2019-03-09 ENCOUNTER — Encounter: Payer: Self-pay | Admitting: Cardiology

## 2019-03-09 VITALS — BP 113/61 | HR 50 | Temp 97.4°F | Ht 72.0 in | Wt 216.2 lb

## 2019-03-09 DIAGNOSIS — I10 Essential (primary) hypertension: Secondary | ICD-10-CM | POA: Diagnosis not present

## 2019-03-09 DIAGNOSIS — E782 Mixed hyperlipidemia: Secondary | ICD-10-CM

## 2019-03-09 DIAGNOSIS — I5022 Chronic systolic (congestive) heart failure: Secondary | ICD-10-CM | POA: Diagnosis not present

## 2019-03-09 DIAGNOSIS — I4891 Unspecified atrial fibrillation: Secondary | ICD-10-CM | POA: Diagnosis not present

## 2019-03-09 MED ORDER — LOSARTAN POTASSIUM 25 MG PO TABS: 12.5000 mg | ORAL_TABLET | Freq: Every day | ORAL | 1 refills | Status: DC
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
## 2019-03-09 NOTE — Patient Instructions (Signed)
Your physician wants you to follow-up in: [place] will receive a reminder letter in the mail two months in advance. If you don't receive a letter, please call our office to schedule the follow-up appointment.  Your physician has recommended you make the following change in your medication:   DECREASE LOSARTAN 12.5 MG DAILY (1/2 TABLET DAILY)   Thank you for choosing [facility]!!

## 2019-06-05 ENCOUNTER — Telehealth: Payer: Self-pay | Admitting: Cardiology

## 2019-06-05 MED ORDER — APIXABAN 5 MG PO TABS: 5.0000 mg | ORAL_TABLET | Freq: Two times a day (BID) | ORAL | 0 refills | Status: DC

## 2019-06-05 NOTE — Telephone Encounter (Signed)
Patient calling the office for samples of medication:   1.  What medication and dosage are you requesting samples for?  ELIQUIS   2.  Are you currently out of this medication?  Please call [phone]

## 2019-06-05 NOTE — Telephone Encounter (Signed)
Pt son will call us when he gets to office and we will bring out samples

## 2019-06-18 ENCOUNTER — Other Ambulatory Visit: Payer: Self-pay | Admitting: Cardiology

## 2019-06-27 ENCOUNTER — Telehealth: Payer: Self-pay | Admitting: Cardiology

## 2019-06-27 MED ORDER — APIXABAN 5 MG PO TABS: 5.0000 mg | ORAL_TABLET | Freq: Two times a day (BID) | ORAL | 0 refills | Status: DC

## 2019-06-27 NOTE — Telephone Encounter (Signed)
Patient calling the office for samples of medication:   1.  What medication and dosage are you requesting samples for?   Eliquis   2.  Are you currently out of this medication?   Please call [phone]

## 2019-06-27 NOTE — Telephone Encounter (Signed)
Pt son Audry Pili will come by and pick up samples

## 2019-09-05 ENCOUNTER — Telehealth (INDEPENDENT_AMBULATORY_CARE_PROVIDER_SITE_OTHER): Payer: Medicare HMO | Admitting: Cardiology

## 2019-09-05 ENCOUNTER — Encounter: Payer: Self-pay | Admitting: Cardiology

## 2019-09-05 ENCOUNTER — Encounter: Payer: Self-pay | Admitting: *Deleted

## 2019-09-05 VITALS — BP 106/68 | HR 80 | Ht 72.0 in | Wt 218.0 lb

## 2019-09-05 DIAGNOSIS — I4891 Unspecified atrial fibrillation: Secondary | ICD-10-CM

## 2019-09-05 DIAGNOSIS — I5022 Chronic systolic (congestive) heart failure: Secondary | ICD-10-CM

## 2019-09-05 DIAGNOSIS — I10 Essential (primary) hypertension: Secondary | ICD-10-CM

## 2019-09-05 DIAGNOSIS — E782 Mixed hyperlipidemia: Secondary | ICD-10-CM

## 2019-09-05 NOTE — Patient Instructions (Signed)
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
## 2019-10-11 ENCOUNTER — Other Ambulatory Visit: Payer: Self-pay | Admitting: Cardiology

## 2019-10-11 MED ORDER — CARVEDILOL 25 MG PO TABS: ORAL_TABLET | ORAL | 2 refills | Status: DC

## 2019-10-11 MED ORDER — APIXABAN 5 MG PO TABS: 5.0000 mg | ORAL_TABLET | Freq: Two times a day (BID) | ORAL | 6 refills | Status: DC

## 2019-10-11 MED ORDER — SPIRONOLACTONE 25 MG PO TABS: 25.0000 mg | ORAL_TABLET | Freq: Every day | ORAL | 2 refills | Status: DC

## 2019-10-11 MED ORDER — DIGOXIN 125 MCG PO TABS: ORAL_TABLET | ORAL | 2 refills | Status: DC

## 2019-10-11 MED ORDER — LOSARTAN POTASSIUM 25 MG PO TABS: 12.5000 mg | ORAL_TABLET | Freq: Every day | ORAL | 2 refills | Status: DC

## 2019-10-11 MED ORDER — ATORVASTATIN CALCIUM 20 MG PO TABS: ORAL_TABLET | ORAL | 2 refills | Status: DC

## 2019-10-11 NOTE — Telephone Encounter (Signed)
Please send all cardiac medications to [facility] in [place]

## 2019-10-19 ENCOUNTER — Other Ambulatory Visit: Payer: Self-pay | Admitting: *Deleted

## 2019-10-19 MED ORDER — APIXABAN 5 MG PO TABS: 5.0000 mg | ORAL_TABLET | Freq: Two times a day (BID) | ORAL | 0 refills | Status: DC

## 2019-10-19 NOTE — Telephone Encounter (Signed)
Advised that samples available and patient assistance application in bag to fill out and return to our office. Verbalized understanding.

## 2019-11-12 ENCOUNTER — Other Ambulatory Visit: Payer: Self-pay | Admitting: *Deleted

## 2019-11-12 MED ORDER — APIXABAN 5 MG PO TABS: 5.0000 mg | ORAL_TABLET | Freq: Two times a day (BID) | ORAL | 0 refills | Status: DC

## 2019-12-19 ENCOUNTER — Telehealth: Payer: Self-pay | Admitting: Cardiology

## 2019-12-19 NOTE — Telephone Encounter (Signed)
Per phone note dated back to 09/20/2018-amlodipine was stopped. Message faxed to PCP office.

## 2019-12-19 NOTE — Telephone Encounter (Signed)
Thane Edu called stating that patient was seen by PCP 12/18/2019. Dr. Was questioning if patient is still suppose to be on  Amlodipine  [phone]

## 2019-12-19 NOTE — Telephone Encounter (Signed)
Ashworth, Merceda Elks, CMA     09/20/18 11:29 AM Note Pt sister [name] made aware of changes and voiced understanding - will update [place] on BP next week - updated medication list     Arnoldo Lenis, MD to Ashworth, Merceda Elks, CMA     09/20/18 10:17 AM Note Have him stop norvasc and lower losartan to 25mg  daily. Be sure his wife or sister is aware of changes. Update [place] on bp's early next week   J BrancH MD

## 2020-01-04 ENCOUNTER — Telehealth: Payer: Self-pay | Admitting: Cardiology

## 2020-01-04 NOTE — Telephone Encounter (Signed)
[name] -sister called stating that she called approximately 2 weeks ago about medication management and was never called back.   [phone].

## 2020-01-04 NOTE — Telephone Encounter (Signed)
Pt sister [name] made aware that we stopped amlodipine back in March and that I had spoken with her at that time - [name] was aware of this and also that Dr Burnard Hawthorne office was made aware of this change 12/19/19 - [name] wanted to confirm this change since she didn't see amlodipine on pt med list - also aware that losartan was decreased 25 mg daily - says pt BP has been WNL - pt appreciative

## 2020-01-07 ENCOUNTER — Telehealth: Payer: Self-pay | Admitting: Cardiology

## 2020-01-07 MED ORDER — APIXABAN 5 MG PO TABS: 5.0000 mg | ORAL_TABLET | Freq: Two times a day (BID) | ORAL | 0 refills | Status: DC

## 2020-01-07 NOTE — Telephone Encounter (Signed)
Justin [name] notified - samples ready for pick up.   

## 2020-01-07 NOTE — Telephone Encounter (Signed)
Requesting samples of Eliquis.

## 2020-02-04 ENCOUNTER — Telehealth: Payer: Self-pay | Admitting: Cardiology

## 2020-02-04 MED ORDER — APIXABAN 5 MG PO TABS: 5.0000 mg | ORAL_TABLET | Freq: Two times a day (BID) | ORAL | 0 refills | Status: DC

## 2020-02-04 NOTE — Telephone Encounter (Signed)
Patient calling the office for samples of medication:   1.What medicationand dosage are you requesting samples for?apixaban (ELIQUIS) 5 MG TABS tablet    2.Are you currentlyout of this medication?

## 2020-02-04 NOTE — Telephone Encounter (Signed)
Pt son aware that we had samples of Eliquis available for pickup

## 2020-02-22 ENCOUNTER — Encounter (INDEPENDENT_AMBULATORY_CARE_PROVIDER_SITE_OTHER): Payer: Self-pay | Admitting: Gastroenterology

## 2020-02-27 ENCOUNTER — Telehealth: Payer: Self-pay | Admitting: *Deleted

## 2020-02-27 NOTE — Telephone Encounter (Signed)
Advised that patient assistance application for eliquis available for pick up at [facility] office, to fill out all patient sections and bring back to office with proof of income for total household and prescription out of pocket expense for 2021. Verbalized understanding.

## 2020-03-07 ENCOUNTER — Ambulatory Visit: Payer: Medicare HMO | Admitting: Cardiology

## 2020-03-07 ENCOUNTER — Encounter: Payer: Self-pay | Admitting: Cardiology

## 2020-03-07 ENCOUNTER — Other Ambulatory Visit: Payer: Self-pay

## 2020-03-07 ENCOUNTER — Encounter: Payer: Self-pay | Admitting: *Deleted

## 2020-03-07 VITALS — BP 116/64 | HR 86 | Ht 72.0 in | Wt 226.0 lb

## 2020-03-07 DIAGNOSIS — I5022 Chronic systolic (congestive) heart failure: Secondary | ICD-10-CM | POA: Diagnosis not present

## 2020-03-07 DIAGNOSIS — I4891 Unspecified atrial fibrillation: Secondary | ICD-10-CM

## 2020-03-07 DIAGNOSIS — I4821 Permanent atrial fibrillation: Secondary | ICD-10-CM

## 2020-03-07 DIAGNOSIS — E782 Mixed hyperlipidemia: Secondary | ICD-10-CM | POA: Diagnosis not present

## 2020-03-07 DIAGNOSIS — I10 Essential (primary) hypertension: Secondary | ICD-10-CM

## 2020-03-07 NOTE — Patient Instructions (Signed)
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
## 2020-03-24 ENCOUNTER — Telehealth: Payer: Self-pay | Admitting: Cardiology

## 2020-03-24 MED ORDER — APIXABAN 5 MG PO TABS: 5.0000 mg | ORAL_TABLET | Freq: Two times a day (BID) | ORAL | 0 refills | Status: DC

## 2020-03-24 NOTE — Telephone Encounter (Signed)
Advised that patient assistance paperwork needed to be turned in ASAP. Advised that samples aren't always available. Verbalized understanding. Eliquis samples provided.

## 2020-03-24 NOTE — Telephone Encounter (Signed)
Patient calling the office for samples of medication:   1.  What medication and dosage are you requesting samples for? ELIQUIS   2.  Are you currently out of this medication? NO

## 2020-03-26 ENCOUNTER — Ambulatory Visit (INDEPENDENT_AMBULATORY_CARE_PROVIDER_SITE_OTHER): Payer: Medicare HMO | Admitting: Gastroenterology

## 2020-03-26 ENCOUNTER — Other Ambulatory Visit: Payer: Self-pay

## 2020-03-26 ENCOUNTER — Encounter (INDEPENDENT_AMBULATORY_CARE_PROVIDER_SITE_OTHER): Payer: Self-pay | Admitting: Gastroenterology

## 2020-03-26 VITALS — BP 108/64 | HR 59 | Temp 96.9°F | Ht 72.0 in | Wt 224.1 lb

## 2020-03-26 DIAGNOSIS — K219 Gastro-esophageal reflux disease without esophagitis: Secondary | ICD-10-CM

## 2020-03-26 DIAGNOSIS — R1310 Dysphagia, unspecified: Secondary | ICD-10-CM | POA: Diagnosis not present

## 2020-03-26 MED ORDER — OMEPRAZOLE 20 MG PO CPDR
20.0000 mg | DELAYED_RELEASE_CAPSULE | Freq: Every day | ORAL | 3 refills | Status: DC
Start: 2020-03-26 — End: 2020-06-12
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
## 2020-03-26 NOTE — Patient Instructions (Signed)
Try omeprazole 20mg  for acid reflux to see if this will improve swallowing. We are arranging barium swallow test to evaluate swallowing issues.

## 2020-04-01 ENCOUNTER — Ambulatory Visit (HOSPITAL_COMMUNITY): Payer: Medicare HMO

## 2020-04-07 ENCOUNTER — Other Ambulatory Visit (INDEPENDENT_AMBULATORY_CARE_PROVIDER_SITE_OTHER): Payer: Self-pay | Admitting: Gastroenterology

## 2020-04-07 ENCOUNTER — Other Ambulatory Visit: Payer: Self-pay

## 2020-04-07 ENCOUNTER — Ambulatory Visit (HOSPITAL_COMMUNITY)
Admission: RE | Admit: 2020-04-07 | Discharge: 2020-04-07 | Disposition: A | Discharge status: Home / Self Care | Payer: Medicare HMO | Source: Ambulatory Visit | Attending: Gastroenterology | Admitting: Gastroenterology

## 2020-04-07 DIAGNOSIS — R1310 Dysphagia, unspecified: Secondary | ICD-10-CM

## 2020-04-07 DIAGNOSIS — K219 Gastro-esophageal reflux disease without esophagitis: Secondary | ICD-10-CM

## 2020-04-09 ENCOUNTER — Other Ambulatory Visit (INDEPENDENT_AMBULATORY_CARE_PROVIDER_SITE_OTHER): Payer: Self-pay | Admitting: Gastroenterology

## 2020-04-09 NOTE — Progress Notes (Signed)
Barium swallow images reviewed with Dr. Laural Golden.  Needs endoscopy with possible ED for further evaluation - will need to verify with Dr. Harl Bowie that his anticoag can be held prior to procedure.  Order in chart

## 2020-04-15 ENCOUNTER — Other Ambulatory Visit (INDEPENDENT_AMBULATORY_CARE_PROVIDER_SITE_OTHER): Payer: Self-pay | Admitting: *Deleted

## 2020-04-16 ENCOUNTER — Telehealth (INDEPENDENT_AMBULATORY_CARE_PROVIDER_SITE_OTHER): Payer: Self-pay | Admitting: *Deleted

## 2020-04-16 ENCOUNTER — Encounter (INDEPENDENT_AMBULATORY_CARE_PROVIDER_SITE_OTHER): Payer: Self-pay | Admitting: *Deleted

## 2020-04-16 NOTE — Telephone Encounter (Signed)
Patient with diagnosis of afib on Eliquis for anticoagulation.    Procedure: endoscopy with dilatation Date of procedure: 05/14/20     :[id number]}  CHA2DS2-VASc Score = 5  This indicates a 7.2% annual risk of stroke. The patient's score is based upon: CHF History: 1 HTN History: 1 Diabetes History: 1 Stroke History: 0 Vascular Disease History: 0 Age Score: 2 Gender Score: 0   CrCl 10mL/min using adjusted body weight Platelet count 389K  Per office protocol, patient can hold Eliquis for 2 days prior to procedure as requested.

## 2020-04-16 NOTE — Telephone Encounter (Signed)
   Primary Cardiologist: Carlyle Dolly, MD  Chart reviewed as part of pre-operative protocol coverage. We have been asked for guidance to hold eliquis, not medical clearance.   Per our clinical pharmacist: Patient with diagnosis of afib on Eliquis for anticoagulation.    Procedure: endoscopy with dilatation Date of procedure: 05/14/20     :[id number]}  CHA2DS2-VASc Score = 5  This indicates a 7.2% annual risk of stroke. The patient's score is based upon: CHF History: 1 HTN History: 1 Diabetes History: 1 Stroke History: 0 Vascular Disease History: 0 Age Score: 2 Gender Score: 0   CrCl 35mL/min using adjusted body weight Platelet count 389K  Per office protocol, patient can hold Eliquis for 2 days prior to procedure as requested   I will route this recommendation to the requesting party via [facility] fax function and remove from pre-op pool. Please call with questions.  Tami Lin Ram Haugan, PA 04/16/2020, 11:23 AM

## 2020-04-16 NOTE — Telephone Encounter (Signed)
04/16/20  [place] 09/30/1940   Request for medication clearance to hold medication prior to procedure:   . Procedure to be performed? endoscopy with dilatation  . Procedure Date: 05/14/20   . Medications that need to be held prior to procedure and how long?   Eliquis 2 days prior   . Name of physician performing procedure? Dr Laural Golden     . Anesthesia type? Monitored (propofol)   ______ Yes, the above patient can stop the medicine indicated above   ______ No, the above cannot stop the medicine indicated above    ______________________________     ______________________         Doctor Signature             Date   Please fax this back at [phone]. If you have questions I can be reached at [phone].  Thank you   [facility]

## 2020-04-17 NOTE — Telephone Encounter (Signed)
Pt has been advised ok to hold his Eliquis x 2 days prior to his upcoming procedure. Pt will resume Eliquis once Dr.Rehm feels it is safe, pt aware. I will fax notes to requesting office. Will remove from the pre op call back pool.

## 2020-05-09 NOTE — Patient Instructions (Signed)
Justin [name]  05/09/2020     @PREFPERIOPPHARMACY @   Your procedure is scheduled on 05/14/2020  Report to [facility] at  [place].M.  Call this number if you have problems the morning of surgery:  [phone]   Remember:  Follow the diet and prep instructions given to you by the office.                      Take these medicines the morning of surgery with A SIP OF WATER  Carvedilol, digoxin, priloec. Your last dose of eliquis should be 05/11/2020.    Do not wear jewelry, make-up or nail polish.  Do not wear lotions, powders, or perfumes. Please wear deodorant and brush your teeth.  Do not shave 48 hours prior to surgery.  Men may shave face and neck.  Do not bring valuables to the hospital.  [facility] is not responsible for any belongings or valuables.  Contacts, dentures or bridgework may not be worn into surgery.  Leave your suitcase in the car.  After surgery it may be brought to your room.  For patients admitted to the hospital, discharge time will be determined by your treatment team.  Patients discharged the day of surgery will not be allowed to drive home.   Name and phone number of your driver:   family Special instructions:  DO NOT smoke the morning of your procedure.  Please read over the following fact sheets that you were given. Anesthesia Post-op Instructions and Care and Recovery After Surgery       Upper Endoscopy, Adult, Care After This sheet gives you information about how to care for yourself after your procedure. Your health care provider may also give you more specific instructions. If you have problems or questions, contact your health care provider. What can I expect after the procedure? After the procedure, it is common to have:  A sore throat.  Mild stomach pain or discomfort.  Bloating.  Nausea. Follow these instructions at home:   Follow instructions from your health care provider about what to eat or drink after your  procedure.  Return to your normal activities as told by your health care provider. Ask your health care provider what activities are safe for you.  Take over-the-counter and prescription medicines only as told by your health care provider.  Do not drive for 24 hours if you were given a sedative during your procedure.  Keep all follow-up visits as told by your health care provider. This is important. Contact a health care provider if you have:  A sore throat that lasts longer than one day.  Trouble swallowing. Get help right away if:  You vomit blood or your vomit looks like coffee grounds.  You have: ? A fever. ? Bloody, black, or tarry stools. ? A severe sore throat or you cannot swallow. ? Difficulty breathing. ? Severe pain in your chest or abdomen. Summary  After the procedure, it is common to have a sore throat, mild stomach discomfort, bloating, and nausea.  Do not drive for 24 hours if you were given a sedative during the procedure.  Follow instructions from your health care provider about what to eat or drink after your procedure.  Return to your normal activities as told by your health care provider. This information is not intended to replace advice given to you by your health care provider. Make sure you discuss any questions you  have with your health care provider. Document Revised: 12/06/2017 Document Reviewed: 11/14/2017 Elsevier Patient Education  [place].  Esophageal Dilatation Esophageal dilatation, also called esophageal dilation, is a procedure to widen or open (dilate) a blocked or narrowed part of the esophagus. The esophagus is the part of the body that moves food and liquid from the mouth to the stomach. You may need this procedure if:  You have a buildup of scar tissue in your esophagus that makes it difficult, painful, or impossible to swallow. This can be caused by gastroesophageal reflux disease (GERD).  You have cancer of the  esophagus.  There is a problem with how food moves through your esophagus. In some cases, you may need this procedure repeated at a later time to dilate the esophagus gradually. Tell a health care provider about:  Any allergies you have.  All medicines you are taking, including vitamins, herbs, eye drops, creams, and over-the-counter medicines.  Any problems you or family members have had with anesthetic medicines.  Any blood disorders you have.  Any surgeries you have had.  Any medical conditions you have.  Any antibiotic medicines you are required to take before dental procedures.  Whether you are pregnant or may be pregnant. What are the risks? Generally, this is a safe procedure. However, problems may occur, including:  Bleeding due to a tear in the lining of the esophagus.  A hole (perforation) in the esophagus. What happens before the procedure?  Follow instructions from your health care provider about eating or drinking restrictions.  Ask your health care provider about changing or stopping your regular medicines. This is especially important if you are taking diabetes medicines or blood thinners.  Plan to have someone take you home from the hospital or clinic.  Plan to have a responsible adult care for you for at least 24 hours after you leave the hospital or clinic. This is important. What happens during the procedure?  You may be given a medicine to help you relax (sedative).  A numbing medicine may be sprayed into the back of your throat, or you may gargle the medicine.  Your health care provider may perform the dilatation using various surgical instruments, such as: ? Simple dilators. This instrument is carefully placed in the esophagus to stretch it. ? Guided wire bougies. This involves using an endoscope to insert a wire into the esophagus. A dilator is passed over this wire to enlarge the esophagus. Then the wire is removed. ? Balloon dilators. An endoscope  with a small balloon at the end is inserted into the esophagus. The balloon is inflated to stretch the esophagus and open it up. The procedure may vary among health care providers and hospitals. What happens after the procedure?  Your blood pressure, heart rate, breathing rate, and blood oxygen level will be monitored until the medicines you were given have worn off.  Your throat may feel slightly sore and numb. This will improve slowly over time.  You will not be allowed to eat or drink until your throat is no longer numb.  When you are able to drink, urinate, and sit on the edge of the bed without nausea or dizziness, you may be able to return home. Follow these instructions at home:  Take over-the-counter and prescription medicines only as told by your health care provider.  Do not drive for 24 hours if you were given a sedative during your procedure.  You should have a responsible adult with you for 24  hours after the procedure.  Follow instructions from your health care provider about any eating or drinking restrictions.  Do not use any products that contain nicotine or tobacco, such as cigarettes and e-cigarettes. If you need help quitting, ask your health care provider.  Keep all follow-up visits as told by your health care provider. This is important. Get help right away if you:  Have a fever.  Have chest pain.  Have pain that is not relieved by medication.  Have trouble breathing.  Have trouble swallowing.  Vomit blood. Summary  Esophageal dilatation, also called esophageal dilation, is a procedure to widen or open (dilate) a blocked or narrowed part of the esophagus.  Plan to have someone take you home from the hospital or clinic.  For this procedure, a numbing medicine may be sprayed into the back of your throat, or you may gargle the medicine.  Do not drive for 24 hours if you were given a sedative during your procedure. This information is not intended to  replace advice given to you by your health care provider. Make sure you discuss any questions you have with your health care provider. Document Revised: 04/11/2019 Document Reviewed: 04/19/2017 Elsevier Patient Education  2020 [place] After These instructions provide you with information about caring for yourself after your procedure. Your health care provider may also give you more specific instructions. Your treatment has been planned according to current medical practices, but problems sometimes occur. Call your health care provider if you have any problems or questions after your procedure. What can I expect after the procedure? After your procedure, you may:  Feel sleepy for several hours.  Feel clumsy and have poor balance for several hours.  Feel forgetful about what happened after the procedure.  Have poor judgment for several hours.  Feel nauseous or vomit.  Have a sore throat if you had a breathing tube during the procedure. Follow these instructions at home: For at least 24 hours after the procedure:      Have a responsible adult stay with you. It is important to have someone help care for you until you are awake and alert.  Rest as needed.  Do not: ? Participate in activities in which you could fall or become injured. ? Drive. ? Use heavy machinery. ? Drink alcohol. ? Take sleeping pills or medicines that cause drowsiness. ? Make important decisions or sign legal documents. ? Take care of children on your own. Eating and drinking  Follow the diet that is recommended by your health care provider.  If you vomit, drink water, juice, or soup when you can drink without vomiting.  Make sure you have little or no nausea before eating solid foods. General instructions  Take over-the-counter and prescription medicines only as told by your health care provider.  If you have sleep apnea, surgery and certain medicines can increase  your risk for breathing problems. Follow instructions from your health care provider about wearing your sleep device: ? Anytime you are sleeping, including during daytime naps. ? While taking prescription pain medicines, sleeping medicines, or medicines that make you drowsy.  If you smoke, do not smoke without supervision.  Keep all follow-up visits as told by your health care provider. This is important. Contact a health care provider if:  You keep feeling nauseous or you keep vomiting.  You feel light-headed.  You develop a rash.  You have a fever. Get help right away if:  You have trouble  breathing. Summary  For several hours after your procedure, you may feel sleepy and have poor judgment.  Have a responsible adult stay with you for at least 24 hours or until you are awake and alert. This information is not intended to replace advice given to you by your health care provider. Make sure you discuss any questions you have with your health care provider. Document Revised: 09/12/2017 Document Reviewed: 10/05/2015 Elsevier Patient Education  [place].

## 2020-05-12 ENCOUNTER — Other Ambulatory Visit: Payer: Self-pay

## 2020-05-12 ENCOUNTER — Other Ambulatory Visit (HOSPITAL_COMMUNITY)
Admission: RE | Admit: 2020-05-12 | Discharge: 2020-05-12 | Disposition: A | Discharge status: Home / Self Care | Payer: Medicare HMO | Source: Ambulatory Visit | Attending: Internal Medicine | Admitting: Internal Medicine

## 2020-05-12 ENCOUNTER — Encounter (HOSPITAL_COMMUNITY): Payer: Self-pay

## 2020-05-12 ENCOUNTER — Encounter (HOSPITAL_COMMUNITY)
Admission: RE | Admit: 2020-05-12 | Discharge: 2020-05-12 | Disposition: A | Discharge status: Home / Self Care | Payer: Medicare HMO | Source: Ambulatory Visit | Attending: Internal Medicine | Admitting: Internal Medicine

## 2020-05-12 DIAGNOSIS — I4510 Unspecified right bundle-branch block: Secondary | ICD-10-CM | POA: Insufficient documentation

## 2020-05-12 DIAGNOSIS — I4891 Unspecified atrial fibrillation: Secondary | ICD-10-CM | POA: Diagnosis not present

## 2020-05-12 DIAGNOSIS — Z20822 Contact with and (suspected) exposure to covid-19: Secondary | ICD-10-CM | POA: Diagnosis not present

## 2020-05-12 DIAGNOSIS — Z01818 Encounter for other preprocedural examination: Secondary | ICD-10-CM | POA: Diagnosis present

## 2020-05-12 LAB — BASIC METABOLIC PANEL
Anion gap: 7 (ref 5–15)
BUN: 15 mg/dL (ref 8–23)
CO2: 21 mmol/L — ABNORMAL LOW (ref 22–32)
Calcium: 8.6 mg/dL — ABNORMAL LOW (ref 8.9–10.3)
Chloride: 106 mmol/L (ref 98–111)
Creatinine, Ser: 1.55 mg/dL — ABNORMAL HIGH (ref 0.61–1.24)
GFR, Estimated: 45 mL/min — ABNORMAL LOW (ref >60)
Glucose, Bld: 278 mg/dL — ABNORMAL HIGH (ref 70–99)
Potassium: 4.5 mmol/L (ref 3.5–5.1)
Sodium: 134 mmol/L — ABNORMAL LOW (ref 135–145)

## 2020-05-12 LAB — CBC WITH DIFFERENTIAL/PLATELET
Abs Immature Granulocytes: 0.07 10*3/uL (ref 0.00–0.07)
Basophils Absolute: 0.1 10*3/uL (ref 0.0–0.1)
Basophils Relative: 0 %
Eosinophils Absolute: 0.2 10*3/uL (ref 0.0–0.5)
Eosinophils Relative: 2 %
HCT: 37.5 % — ABNORMAL LOW (ref 39.0–52.0)
Hemoglobin: 11.7 g/dL — ABNORMAL LOW (ref 13.0–17.0)
Immature Granulocytes: 1 %
Lymphocytes Relative: 27 %
Lymphs Abs: 3.1 10*3/uL (ref 0.7–4.0)
MCH: 31.5 pg (ref 26.0–34.0)
MCHC: 31.2 g/dL (ref 30.0–36.0)
MCV: 100.8 fL — ABNORMAL HIGH (ref 80.0–100.0)
Monocytes Absolute: 1.1 10*3/uL — ABNORMAL HIGH (ref 0.1–1.0)
Monocytes Relative: 10 %
Neutro Abs: 6.9 10*3/uL (ref 1.7–7.7)
Neutrophils Relative %: 60 %
Platelets: 267 10*3/uL (ref 150–400)
RBC: 3.72 MIL/uL — ABNORMAL LOW (ref 4.22–5.81)
RDW: 14.2 % (ref 11.5–15.5)
WBC: 11.4 10*3/uL — ABNORMAL HIGH (ref 4.0–10.5)
nRBC: 0.2 % (ref 0.0–0.2)

## 2020-05-12 LAB — SARS CORONAVIRUS 2 (TAT 6-24 HRS): SARS Coronavirus 2: NEGATIVE

## 2020-05-13 NOTE — Pre-Procedure Instructions (Signed)
Dr Charna Elizabeth reviewed Glucose results and wants patient to take his Metformin in the morning before his procedure. I called patient and his wife states, "he is still asleep". She took message and understood by verbalization that he needs to take his Metformin.

## 2020-05-14 ENCOUNTER — Ambulatory Visit (HOSPITAL_COMMUNITY)
Admission: RE | Admit: 2020-05-14 | Discharge: 2020-05-14 | Disposition: A | Discharge status: Home / Self Care | Payer: Medicare HMO | Attending: Internal Medicine | Admitting: Internal Medicine

## 2020-05-14 ENCOUNTER — Ambulatory Visit (HOSPITAL_COMMUNITY): Payer: Medicare HMO | Admitting: Anesthesiology

## 2020-05-14 ENCOUNTER — Encounter (HOSPITAL_COMMUNITY)
Admission: RE | Disposition: A | Discharge status: Home / Self Care | Payer: Self-pay | Source: Home / Self Care | Attending: Internal Medicine

## 2020-05-14 ENCOUNTER — Encounter (HOSPITAL_COMMUNITY): Payer: Self-pay | Admitting: Internal Medicine

## 2020-05-14 DIAGNOSIS — Z800 Family history of malignant neoplasm of digestive organs: Secondary | ICD-10-CM | POA: Diagnosis not present

## 2020-05-14 DIAGNOSIS — Z8249 Family history of ischemic heart disease and other diseases of the circulatory system: Secondary | ICD-10-CM | POA: Diagnosis not present

## 2020-05-14 DIAGNOSIS — I4891 Unspecified atrial fibrillation: Secondary | ICD-10-CM | POA: Insufficient documentation

## 2020-05-14 DIAGNOSIS — E119 Type 2 diabetes mellitus without complications: Secondary | ICD-10-CM | POA: Insufficient documentation

## 2020-05-14 DIAGNOSIS — E785 Hyperlipidemia, unspecified: Secondary | ICD-10-CM | POA: Insufficient documentation

## 2020-05-14 DIAGNOSIS — R1314 Dysphagia, pharyngoesophageal phase: Secondary | ICD-10-CM

## 2020-05-14 DIAGNOSIS — Z823 Family history of stroke: Secondary | ICD-10-CM | POA: Diagnosis not present

## 2020-05-14 DIAGNOSIS — I083 Combined rheumatic disorders of mitral, aortic and tricuspid valves: Secondary | ICD-10-CM | POA: Insufficient documentation

## 2020-05-14 DIAGNOSIS — D649 Anemia, unspecified: Secondary | ICD-10-CM | POA: Diagnosis not present

## 2020-05-14 DIAGNOSIS — Z79899 Other long term (current) drug therapy: Secondary | ICD-10-CM | POA: Diagnosis not present

## 2020-05-14 DIAGNOSIS — Z8719 Personal history of other diseases of the digestive system: Secondary | ICD-10-CM | POA: Diagnosis not present

## 2020-05-14 DIAGNOSIS — I5020 Unspecified systolic (congestive) heart failure: Secondary | ICD-10-CM | POA: Diagnosis not present

## 2020-05-14 DIAGNOSIS — K3189 Other diseases of stomach and duodenum: Secondary | ICD-10-CM | POA: Diagnosis not present

## 2020-05-14 DIAGNOSIS — K449 Diaphragmatic hernia without obstruction or gangrene: Secondary | ICD-10-CM | POA: Insufficient documentation

## 2020-05-14 DIAGNOSIS — Z801 Family history of malignant neoplasm of trachea, bronchus and lung: Secondary | ICD-10-CM | POA: Insufficient documentation

## 2020-05-14 DIAGNOSIS — R1310 Dysphagia, unspecified: Secondary | ICD-10-CM | POA: Diagnosis present

## 2020-05-14 DIAGNOSIS — K219 Gastro-esophageal reflux disease without esophagitis: Secondary | ICD-10-CM | POA: Insufficient documentation

## 2020-05-14 DIAGNOSIS — H5040 Unspecified heterotropia: Secondary | ICD-10-CM | POA: Diagnosis not present

## 2020-05-14 DIAGNOSIS — K222 Esophageal obstruction: Secondary | ICD-10-CM | POA: Diagnosis not present

## 2020-05-14 DIAGNOSIS — I110 Hypertensive heart disease with heart failure: Secondary | ICD-10-CM | POA: Diagnosis not present

## 2020-05-14 DIAGNOSIS — Z7901 Long term (current) use of anticoagulants: Secondary | ICD-10-CM | POA: Diagnosis not present

## 2020-05-14 DIAGNOSIS — Z7984 Long term (current) use of oral hypoglycemic drugs: Secondary | ICD-10-CM | POA: Diagnosis not present

## 2020-05-14 DIAGNOSIS — R933 Abnormal findings on diagnostic imaging of other parts of digestive tract: Secondary | ICD-10-CM

## 2020-05-14 HISTORY — PX: ESOPHAGOGASTRODUODENOSCOPY (EGD) WITH PROPOFOL: SHX5813

## 2020-05-14 HISTORY — PX: BIOPSY: SHX5522

## 2020-05-14 HISTORY — PX: ESOPHAGEAL DILATION: SHX303

## 2020-05-14 LAB — GLUCOSE, CAPILLARY
Glucose-Capillary: 186 mg/dL — ABNORMAL HIGH (ref 70–99)
Glucose-Capillary: 196 mg/dL — ABNORMAL HIGH (ref 70–99)

## 2020-05-14 SURGERY — ESOPHAGOGASTRODUODENOSCOPY (EGD) WITH PROPOFOL
Anesthesia: General

## 2020-05-14 MED ORDER — LACTATED RINGERS IV SOLN: INTRAVENOUS | Status: DC | PRN

## 2020-05-14 MED ORDER — PROPOFOL 500 MG/50ML IV EMUL
INTRAVENOUS | Status: DC | PRN
  Administered 2020-05-14: 100 ug/kg/min via INTRAVENOUS

## 2020-05-14 MED ORDER — LIDOCAINE HCL (CARDIAC) PF 100 MG/5ML IV SOSY
PREFILLED_SYRINGE | INTRAVENOUS | Status: DC | PRN
  Administered 2020-05-14: 50 mg via INTRAVENOUS

## 2020-05-14 MED ORDER — LACTATED RINGERS IV SOLN
Freq: Once | INTRAVENOUS | Status: AC
  Administered 2020-05-14: 1000 mL via INTRAVENOUS

## 2020-05-14 MED ORDER — LIDOCAINE VISCOUS HCL 2 % MT SOLN
15.0000 mL | Freq: Once | OROMUCOSAL | Status: AC
  Administered 2020-05-14: 15 mL via OROMUCOSAL

## 2020-05-14 MED ORDER — PHENYLEPHRINE 40 MCG/ML (10ML) SYRINGE FOR IV PUSH (FOR BLOOD PRESSURE SUPPORT)
PREFILLED_SYRINGE | INTRAVENOUS | Status: DC | PRN
  Administered 2020-05-14: 80 ug via INTRAVENOUS
  Administered 2020-05-14: 120 ug via INTRAVENOUS

## 2020-05-14 MED ORDER — SIMETHICONE 40 MG/0.6ML PO SUSP
ORAL | Status: DC | PRN
  Administered 2020-05-14: 100 mL via ORAL

## 2020-05-14 MED ORDER — LIDOCAINE VISCOUS HCL 2 % MT SOLN
OROMUCOSAL | Status: AC
  Filled 2020-05-14: qty 15

## 2020-05-14 MED ORDER — GLYCOPYRROLATE 0.2 MG/ML IJ SOLN
INTRAMUSCULAR | Status: AC
  Filled 2020-05-14: qty 1

## 2020-05-14 MED ORDER — PROPOFOL 10 MG/ML IV BOLUS
INTRAVENOUS | Status: DC | PRN
  Administered 2020-05-14: 30 mg via INTRAVENOUS
  Administered 2020-05-14: 70 mg via INTRAVENOUS

## 2020-05-14 NOTE — Discharge Instructions (Signed)
Resume Eliquis/apixaban on 05/15/2020. Resume other medications as before. Resume usual diet.  Please remember to chew food thoroughly and eat slowly. No driving for 24 hours. Physician will call with biopsy results.  PATIENT INSTRUCTIONS POST-ANESTHESIA  IMMEDIATELY FOLLOWING SURGERY:  Do not drive or operate machinery for the first twenty four hours after surgery.  Do not make any important decisions for twenty four hours after surgery or while taking narcotic pain medications or sedatives.  If you develop intractable nausea and vomiting or a severe headache please notify your doctor immediately.  FOLLOW-UP:  Please make an appointment with your surgeon as instructed. You do not need to follow up with anesthesia unless specifically instructed to do so.  WOUND CARE INSTRUCTIONS (if applicable):  Keep a dry clean dressing on the anesthesia/puncture wound site if there is drainage.  Once the wound has quit draining you may leave it open to air.  Generally you should leave the bandage intact for twenty four hours unless there is drainage.  If the epidural site drains for more than 36-48 hours please call the anesthesia department.  QUESTIONS?:  Please feel free to call your physician or the hospital operator if you have any questions, and they will be happy to assist you.      Upper Endoscopy, Adult, Care After This sheet gives you information about how to care for yourself after your procedure. Your health care provider may also give you more specific instructions. If you have problems or questions, contact your health care provider. What can I expect after the procedure? After the procedure, it is common to have:  A sore throat.  Mild stomach pain or discomfort.  Bloating.  Nausea. Follow these instructions at home:   Follow instructions from your health care provider about what to eat or drink after your procedure.  Return to your normal activities as told by your health care  provider. Ask your health care provider what activities are safe for you.  Take over-the-counter and prescription medicines only as told by your health care provider.  Do not drive for 24 hours if you were given a sedative during your procedure.  Keep all follow-up visits as told by your health care provider. This is important. Contact a health care provider if you have:  A sore throat that lasts longer than one day.  Trouble swallowing. Get help right away if:  You vomit blood or your vomit looks like coffee grounds.  You have: ? A fever. ? Bloody, black, or tarry stools. ? A severe sore throat or you cannot swallow. ? Difficulty breathing. ? Severe pain in your chest or abdomen. Summary  After the procedure, it is common to have a sore throat, mild stomach discomfort, bloating, and nausea.  Do not drive for 24 hours if you were given a sedative during the procedure.  Follow instructions from your health care provider about what to eat or drink after your procedure.  Return to your normal activities as told by your health care provider. This information is not intended to replace advice given to you by your health care provider. Make sure you discuss any questions you have with your health care provider. Document Revised: 12/06/2017 Document Reviewed: 11/14/2017 Elsevier Patient Education  [place].
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
## 2020-05-14 NOTE — Anesthesia Preprocedure Evaluation (Addendum)
Anesthesia Evaluation  Patient identified by MRN, date of birth, ID band Patient awake    Reviewed: Allergy & Precautions, NPO status , Patient's Chart, lab work & pertinent test results  History of Anesthesia Complications Negative for: history of anesthetic complications  Airway Mallampati: II  TM Distance: >3 FB Neck ROM: Full    Dental  (+) Edentulous Upper, Edentulous Lower   Pulmonary neg pulmonary ROS,    Pulmonary exam normal breath sounds clear to auscultation       Cardiovascular Exercise Tolerance: Good hypertension, Pt. on medications +CHF  + dysrhythmias Atrial Fibrillation  Rhythm:Irregular Rate:Normal  Left ventricle: The cavity size was at the upper limits of  normal. Wall thickness was normal. The estimated ejection fraction was 50%. Diffuse hypokinesis. The study was not technically sufficient to allow evaluation of LV diastolic dysfunction due to atrial fibrillation.  - Aortic valve: Mildly calcified annulus. Trileaflet; mildly  calcified leaflets.  - Mitral valve: Mildly calcified annulus. There was mild  regurgitation.  - Left atrium: The atrium was mildly to moderately dilated.  - Right atrium: The atrium was mildly to moderately dilated.  - Tricuspid valve: There was mild regurgitation.  - Pulmonary arteries: Systolic pressure could not be accurately  estimated.  - Pericardium, extracardiac: There was no pericardial effusion.    Neuro/Psych negative neurological ROS  negative psych ROS   GI/Hepatic GERD  Medicated,  Endo/Other  diabetes, Well Controlled, Type 2, Oral Hypoglycemic Agents  Renal/GU Renal disease  negative genitourinary   Musculoskeletal   Abdominal   Peds  Hematology  (+) anemia ,   Anesthesia Other Findings   Reproductive/Obstetrics                            Anesthesia Physical Anesthesia Plan  ASA: III  Anesthesia Plan: General    Post-op Pain Management:    Induction: Intravenous  PONV Risk Score and Plan: TIVA  Airway Management Planned: Nasal Cannula and Natural Airway  Additional Equipment:   Intra-op Plan:   Post-operative Plan:   Informed Consent: I have reviewed the patients History and Physical, chart, labs and discussed the procedure including the risks, benefits and alternatives for the proposed anesthesia with the patient or authorized representative who has indicated his/her understanding and acceptance.       Plan Discussed with:   Anesthesia Plan Comments:        Anesthesia Quick Evaluation

## 2020-05-14 NOTE — Op Note (Signed)
[facility] Patient Name: Justin [name] Procedure Date: 05/14/2020 9:01 AM MRN: [id number] Date of Birth: Sep 09, 1940 Attending MD: Hildred Laser , MD CSN: [id number] Age: 79 Admit Type: Outpatient Procedure:                Upper GI endoscopy Indications:              Esophageal dysphagia, Abnormal cine-esophagram Providers:                Hildred Laser, MD, Lurline Del, RN, Lambert Mody, Randa Spike, Technician Referring MD:             Lemmie Evens, MD Medicines:                Propofol per Anesthesia Complications:            No immediate complications. Estimated Blood Loss:     Estimated blood loss was minimal. Procedure:                Pre-Anesthesia Assessment:                           - Prior to the procedure, a History and Physical                            was performed, and patient medications and                            allergies were reviewed. The patient's tolerance of                            previous anesthesia was also reviewed. The risks                            and benefits of the procedure and the sedation                            options and risks were discussed with the patient.                            All questions were answered, and informed consent                            was obtained. Prior Anticoagulants: The patient                            last took Eliquis (apixaban) 3 days prior to the                            procedure. ASA Grade Assessment: III - A patient                            with severe systemic disease. After reviewing the  risks and benefits, the patient was deemed in                            satisfactory condition to undergo the procedure.                           After obtaining informed consent, the endoscope was                            passed under direct vision. Throughout the                            procedure, the patient's blood pressure, pulse,  and                            oxygen saturations were monitored continuously. The                            [id number]-H190 ([id number]) scope was introduced through the                            mouth, and advanced to the second part of duodenum.                            The upper GI endoscopy was accomplished without                            difficulty. The patient tolerated the procedure                            well. Scope In: 9:18:18 AM Scope Out: 9:32:33 AM Total Procedure Duration: 0 hours 14 minutes 15 seconds  Findings:      The hypopharynx was normal.      The examined esophagus was normal.      One benign-appearing, intrinsic mild stenosis was found 36 cm from the       incisors. The stenosis was traversed. The dilation site was examined and       showed no change and no bleeding, mucosal tear or perforation.      A 5 cm hiatal hernia was present.      The entire examined stomach was normal.      Diffuse mucosal changes characterized by granularity and nodularity were       found in the duodenal bulb. Biopsies were taken with a cold forceps for       histology.      The second portion of the duodenum was normal. Impression:               - Normal hypopharynx.                           - Normal esophagus.                           - Benign-appearing esophageal stenosis.                           -  5 cm hiatal hernia.                           - Normal stomach.                           - Mucosal changes in the duodenum. Biopsied.                           - Normal second portion of the duodenum. Moderate Sedation:      Per Anesthesia Care Recommendation:           - Patient has a contact number available for                            emergencies. The signs and symptoms of potential                            delayed complications were discussed with the                            patient. Return to normal activities tomorrow.                            Written discharge  instructions were provided to the                            patient.                           - Resume previous diet today.                           - Continue present medications.                           - Resume Eliquis (apixaban) at prior dose tomorrow.                           - Await pathology results. Procedure Code(s):        --- Professional ---                           [phone], Esophagogastroduodenoscopy, flexible,                            transoral; with biopsy, single or multiple Diagnosis Code(s):        --- Professional ---                           K22.2, Esophageal obstruction                           K44.9, Diaphragmatic hernia without obstruction or                            gangrene  K31.89, Other diseases of stomach and duodenum                           R13.14, Dysphagia, pharyngoesophageal phase                           R93.3, Abnormal findings on diagnostic imaging of                            other parts of digestive tract CPT copyright 2019 [employer]. All rights reserved. The codes documented in this report are preliminary and upon coder review may  be revised to meet current compliance requirements. Hildred Laser, MD Hildred Laser, MD 05/14/2020 9:41:15 AM This report has been signed electronically. Number of Addenda: 0

## 2020-05-14 NOTE — Anesthesia Postprocedure Evaluation (Signed)
Anesthesia Post Note  Patient: Justin [name]  Procedure(s) Performed: ESOPHAGOGASTRODUODENOSCOPY (EGD) WITH PROPOFOL (N/A ) ESOPHAGEAL DILATION (N/A )  Patient location during evaluation: Phase II Anesthesia Type: General Level of consciousness: awake and alert and oriented Pain management: pain level controlled Vital Signs Assessment: post-procedure vital signs reviewed and stable Respiratory status: spontaneous breathing, nonlabored ventilation and respiratory function stable Cardiovascular status: blood pressure returned to baseline and stable Postop Assessment: no apparent nausea or vomiting Anesthetic complications: no   No complications documented.   Last Vitals:  Vitals:   05/14/20 0945 05/14/20 0958  BP: 114/64 127/70  Pulse: (!) 54 (!) 56  Resp: (!) 24   Temp:  36.5 C  SpO2: 96% 99%    Last Pain:  Vitals:   05/14/20 0958  TempSrc: Oral  PainSc: 0-No pain                 [name]

## 2020-05-14 NOTE — Anesthesia Procedure Notes (Signed)
Date/Time: 05/14/2020 9:20 AM Performed by: [name], CRNA Pre-anesthesia Checklist: Patient identified, Emergency Drugs available, Suction available and Patient being monitored Patient Re-evaluated:Patient Re-evaluated prior to induction Oxygen Delivery Method: Nasal cannula Induction Type: IV induction Placement Confirmation: positive ETCO2

## 2020-05-14 NOTE — Transfer of Care (Signed)
Immediate Anesthesia Transfer of Care Note  Patient: Justin [name]  Procedure(s) Performed: ESOPHAGOGASTRODUODENOSCOPY (EGD) WITH PROPOFOL (N/A ) ESOPHAGEAL DILATION (N/A )  Patient Location: PACU  Anesthesia Type:General  Level of Consciousness: awake  Airway & Oxygen Therapy: Patient Spontanous Breathing  Post-op Assessment: Report given to RN and Post -op Vital signs reviewed and stable  Post vital signs: Reviewed and stable  Last Vitals:  Vitals Value Taken Time  BP 103/53 05/14/20 0937  Temp    Pulse 114 05/14/20 0940  Resp 21 05/14/20 0940  SpO2 93 % 05/14/20 0940  Vitals shown include unvalidated device data.  Last Pain:  Vitals:   05/14/20 0757  TempSrc: Oral  PainSc: 0-No pain      Patients Stated Pain Goal: 9 (70/14/10 3013)  Complications: No complications documented.

## 2020-05-15 ENCOUNTER — Other Ambulatory Visit: Payer: Self-pay

## 2020-05-15 LAB — SURGICAL PATHOLOGY

## 2020-05-26 ENCOUNTER — Encounter (HOSPITAL_COMMUNITY): Payer: Self-pay | Admitting: Internal Medicine

## 2020-06-11 ENCOUNTER — Other Ambulatory Visit: Payer: Self-pay | Admitting: Cardiology

## 2020-06-11 ENCOUNTER — Other Ambulatory Visit (INDEPENDENT_AMBULATORY_CARE_PROVIDER_SITE_OTHER): Payer: Self-pay | Admitting: Internal Medicine

## 2020-06-17 ENCOUNTER — Other Ambulatory Visit: Payer: Self-pay | Admitting: *Deleted

## 2020-06-17 MED ORDER — APIXABAN 5 MG PO TABS
5.0000 mg | ORAL_TABLET | Freq: Two times a day (BID) | ORAL | 3 refills | Status: DC
Start: 2020-06-17 — End: 2021-06-24

## 2020-06-26 ENCOUNTER — Other Ambulatory Visit: Payer: Self-pay | Admitting: Cardiology

## 2020-07-17 ENCOUNTER — Telehealth: Payer: Self-pay | Admitting: *Deleted

## 2020-07-17 NOTE — Telephone Encounter (Signed)
Pt approved for Eliquis from [place] through 06/27/2021 - [id number]

## 2020-09-04 ENCOUNTER — Ambulatory Visit: Payer: Medicare HMO | Admitting: Cardiology

## 2020-10-22 ENCOUNTER — Ambulatory Visit: Payer: Medicare Other | Admitting: Cardiology

## 2020-10-22 ENCOUNTER — Encounter: Payer: Self-pay | Admitting: Cardiology

## 2020-10-22 ENCOUNTER — Encounter: Payer: Self-pay | Admitting: *Deleted

## 2020-10-22 VITALS — BP 120/70 | HR 64 | Ht 72.0 in | Wt 218.4 lb

## 2020-10-22 DIAGNOSIS — I10 Essential (primary) hypertension: Secondary | ICD-10-CM | POA: Diagnosis not present

## 2020-10-22 DIAGNOSIS — I5022 Chronic systolic (congestive) heart failure: Secondary | ICD-10-CM | POA: Diagnosis not present

## 2020-10-22 DIAGNOSIS — E782 Mixed hyperlipidemia: Secondary | ICD-10-CM

## 2020-10-22 DIAGNOSIS — I4891 Unspecified atrial fibrillation: Secondary | ICD-10-CM

## 2020-10-22 NOTE — Patient Instructions (Signed)
Your physician recommends that you schedule a follow-up appointment in: 6 MONTHS WITH DR BRANCH  Your physician recommends that you continue on your current medications as directed. Please refer to the Current Medication list given to you today.  Thank you for choosing [facility]!!    
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
## 2020-11-21 ENCOUNTER — Other Ambulatory Visit: Payer: Self-pay | Admitting: Cardiology

## 2020-12-09 ENCOUNTER — Other Ambulatory Visit (INDEPENDENT_AMBULATORY_CARE_PROVIDER_SITE_OTHER): Payer: Self-pay | Admitting: Gastroenterology

## 2021-01-06 ENCOUNTER — Other Ambulatory Visit (HOSPITAL_COMMUNITY): Payer: Self-pay | Admitting: Nurse Practitioner

## 2021-01-06 ENCOUNTER — Other Ambulatory Visit: Payer: Self-pay | Admitting: Nurse Practitioner

## 2021-01-06 DIAGNOSIS — R591 Generalized enlarged lymph nodes: Secondary | ICD-10-CM

## 2021-01-16 ENCOUNTER — Other Ambulatory Visit: Payer: Self-pay

## 2021-01-16 ENCOUNTER — Ambulatory Visit (HOSPITAL_COMMUNITY)
Admission: RE | Admit: 2021-01-16 | Discharge: 2021-01-16 | Disposition: A | Discharge status: Home / Self Care | Payer: Medicare Other | Source: Ambulatory Visit | Attending: Nurse Practitioner | Admitting: Nurse Practitioner

## 2021-01-16 DIAGNOSIS — R591 Generalized enlarged lymph nodes: Secondary | ICD-10-CM | POA: Diagnosis not present

## 2021-03-26 ENCOUNTER — Other Ambulatory Visit (INDEPENDENT_AMBULATORY_CARE_PROVIDER_SITE_OTHER): Payer: Self-pay | Admitting: Gastroenterology

## 2021-04-29 ENCOUNTER — Ambulatory Visit: Payer: Medicare Other | Admitting: Cardiology

## 2021-04-29 ENCOUNTER — Encounter: Payer: Self-pay | Admitting: Cardiology

## 2021-04-29 ENCOUNTER — Encounter: Payer: Self-pay | Admitting: *Deleted

## 2021-04-29 VITALS — BP 116/70 | HR 56 | Ht 72.0 in | Wt 210.6 lb

## 2021-04-29 DIAGNOSIS — I5022 Chronic systolic (congestive) heart failure: Secondary | ICD-10-CM | POA: Diagnosis not present

## 2021-04-29 DIAGNOSIS — I10 Essential (primary) hypertension: Secondary | ICD-10-CM | POA: Diagnosis not present

## 2021-04-29 DIAGNOSIS — I4891 Unspecified atrial fibrillation: Secondary | ICD-10-CM | POA: Diagnosis not present

## 2021-04-29 DIAGNOSIS — E782 Mixed hyperlipidemia: Secondary | ICD-10-CM | POA: Diagnosis not present
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
## 2021-04-29 NOTE — Patient Instructions (Addendum)

## 2021-06-09 ENCOUNTER — Telehealth: Payer: Self-pay | Admitting: Cardiology

## 2021-06-09 NOTE — Telephone Encounter (Signed)
Patient's sister is calling about patient assistance forms for Eliquis.  She wants to know what kind of information she needs to bring in with the paperwork.

## 2021-06-09 NOTE — Telephone Encounter (Signed)
Notified sister of documentation - pharmacy expense list, w2 or 1099, ss info.

## 2021-06-24 ENCOUNTER — Other Ambulatory Visit: Payer: Self-pay | Admitting: *Deleted

## 2021-06-24 MED ORDER — APIXABAN 5 MG PO TABS: 5.0000 mg | ORAL_TABLET | Freq: Two times a day (BID) | ORAL | 3 refills | Status: DC

## 2021-06-26 ENCOUNTER — Telehealth: Payer: Self-pay | Admitting: Cardiology

## 2021-06-26 NOTE — Telephone Encounter (Signed)
She will bring in tax income information to sent to [employer].

## 2021-06-26 NOTE — Telephone Encounter (Signed)
Follow Up:      Patient's sister is returning Gayle's call from yesterday.

## 2021-07-08 ENCOUNTER — Telehealth: Payer: Self-pay | Admitting: Cardiology

## 2021-07-08 NOTE — Telephone Encounter (Signed)
Patient requesting Eliquis 5mg  samples Indication: Atrial fib Last office visit: 04/29/21  Zandra Abts MD Scr: 1.47 on 10/07/20 Age: 80 Weight: 95.5kg  Based on above findings Eliquis 5mg  twice daily is the appropriate dose.Marland Kitchen Pt is past due for CBC/BMP.  Needs lab work to evaluate SCr before more refills or samples can be given.  OK to give samples until labs done.

## 2021-07-08 NOTE — Telephone Encounter (Signed)
Patient calling the office for samples of medication:   1.  What medication and dosage are you requesting samples for? apixaban (ELIQUIS) 5 MG TABS tablet  2.  Are you currently out of this medication? Yes    

## 2021-07-09 ENCOUNTER — Encounter: Payer: Self-pay | Admitting: *Deleted

## 2021-07-09 NOTE — Telephone Encounter (Signed)
Sister Netta Corrigan) notified.  Informed that we do not have Eliquis samples currently.  Also, called [employer] - patient financial criteria dollar amount was exceeded from what they allow.  3% out of pocket will not apply due to this as well.  Sister states that she thinks he may have a couple of bottles left from before.  Advised her to take those up for now.  Suggested that she may call the company and discuss his situation further if she would like to.  Moving forward - if he can not afford the medication, he may need to think about going back on the Warfarin.  She verbalized understanding.  Sister believes that he has had labs done with his pcp in the last few months - will request those first.  If labs that we need are not there, I will mail new lab order to home.  Sister verbalized understanding.

## 2021-07-27 ENCOUNTER — Other Ambulatory Visit: Payer: Self-pay | Admitting: Cardiology

## 2021-08-27 ENCOUNTER — Telehealth: Payer: Self-pay | Admitting: Cardiology

## 2021-08-27 NOTE — Telephone Encounter (Signed)
Patient calling the office for samples of medication:   1.  What medication and dosage are you requesting samples for? apixaban (ELIQUIS) 5 MG TABS tablet  2.  Are you currently out of this medication? No    

## 2021-08-27 NOTE — Telephone Encounter (Signed)
Patient requesting Eliquis 5mg  samples ?Indication: Atrial fib ?Last office visit: 04/29/21  Zandra Abts MD ?Scr: 1.49 on 07/10/21 ?Age: 81 ?Weight: 95.5kg ?  ?Based on above findings Eliquis 5mg  twice daily is the appropriate dose. OK to give samples are available. ?  ? ?

## 2021-09-01 MED ORDER — APIXABAN 5 MG PO TABS: 5.0000 mg | ORAL_TABLET | Freq: Two times a day (BID) | ORAL | 0 refills | Status: DC

## 2021-09-01 NOTE — Telephone Encounter (Signed)
Notified sister Netta Corrigan) samples are ready for pick up via detailed voice message.   ?

## 2021-09-28 ENCOUNTER — Telehealth: Payer: Self-pay | Admitting: Cardiology

## 2021-09-28 NOTE — Telephone Encounter (Signed)
Patient calling the office for samples of medication: ? ? ?1.  What medication and dosage are you requesting samples for?  ? apixaban (ELIQUIS) 5 MG TABS tablet  ?Take 1 tablet (5 mg total) by mouth 2 (two) times daily. ? ?2.  Are you currently out of this medication? No- 1 pill left ? ?Pt's sister states that pt did not get approved for pt assistance and needs samples ?  ?

## 2021-09-29 MED ORDER — APIXABAN 5 MG PO TABS: 5.0000 mg | ORAL_TABLET | Freq: Two times a day (BID) | ORAL | 6 refills | Status: DC

## 2021-09-29 NOTE — Telephone Encounter (Signed)
Patient requesting Eliquis '5mg'$  samples ?Indication: Atrial fib ?Last office visit: 04/29/21  Zandra Abts MD ?Scr: 1.49 on 07/10/21 ?Age: 81 ?Weight: 95.5kg ?  ?Based on above findings Eliquis '5mg'$  twice daily is the appropriate dose. OK to give samples are available. ?

## 2021-09-29 NOTE — Addendum Note (Signed)
Addended by: Merlene Laughter on: 09/29/2021 02:17 PM ? ? Modules accepted: Orders ? ?

## 2021-09-29 NOTE — Telephone Encounter (Signed)
Refills sent to [facility] for eliquis ?Contacted [facility] to check co-pay price and no answer-phone just rang ?Advised that samples not always available ?

## 2021-10-08 ENCOUNTER — Telehealth: Payer: Self-pay | Admitting: *Deleted

## 2021-10-08 NOTE — Telephone Encounter (Signed)
Fax received from [employer] - Eliquis approved from 10/07/2021 through 06/27/2022.   ?

## 2021-10-30 ENCOUNTER — Ambulatory Visit: Payer: Medicare Other | Admitting: Cardiology

## 2021-12-08 ENCOUNTER — Ambulatory Visit: Payer: Medicare Other | Admitting: Cardiology

## 2021-12-09 ENCOUNTER — Encounter: Payer: Self-pay | Admitting: Cardiology

## 2021-12-09 ENCOUNTER — Ambulatory Visit: Payer: Medicare Other | Admitting: Cardiology

## 2021-12-09 VITALS — BP 122/64 | HR 68 | Ht 72.0 in | Wt 219.4 lb

## 2021-12-09 DIAGNOSIS — I4891 Unspecified atrial fibrillation: Secondary | ICD-10-CM

## 2021-12-09 DIAGNOSIS — I10 Essential (primary) hypertension: Secondary | ICD-10-CM

## 2021-12-09 DIAGNOSIS — E782 Mixed hyperlipidemia: Secondary | ICD-10-CM

## 2021-12-09 DIAGNOSIS — I5022 Chronic systolic (congestive) heart failure: Secondary | ICD-10-CM | POA: Diagnosis not present

## 2021-12-09 DIAGNOSIS — D6869 Other thrombophilia: Secondary | ICD-10-CM | POA: Diagnosis not present

## 2021-12-09 NOTE — Patient Instructions (Signed)
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
## 2022-01-06 ENCOUNTER — Inpatient Hospital Stay (HOSPITAL_COMMUNITY): Payer: Medicare Other

## 2022-01-06 ENCOUNTER — Emergency Department (HOSPITAL_COMMUNITY): Payer: Medicare Other

## 2022-01-06 ENCOUNTER — Inpatient Hospital Stay (HOSPITAL_COMMUNITY)
Admission: EM | Admit: 2022-01-06 | Discharge: 2022-01-13 | DRG: 064 | Disposition: A | Discharge status: Hospice | Payer: Medicare Other | Attending: Internal Medicine | Admitting: Internal Medicine

## 2022-01-06 DIAGNOSIS — R54 Age-related physical debility: Secondary | ICD-10-CM | POA: Diagnosis present

## 2022-01-06 DIAGNOSIS — G4700 Insomnia, unspecified: Secondary | ICD-10-CM | POA: Diagnosis present

## 2022-01-06 DIAGNOSIS — R414 Neurologic neglect syndrome: Secondary | ICD-10-CM | POA: Diagnosis present

## 2022-01-06 DIAGNOSIS — E119 Type 2 diabetes mellitus without complications: Secondary | ICD-10-CM | POA: Diagnosis not present

## 2022-01-06 DIAGNOSIS — T45515A Adverse effect of anticoagulants, initial encounter: Secondary | ICD-10-CM | POA: Diagnosis present

## 2022-01-06 DIAGNOSIS — Z7189 Other specified counseling: Secondary | ICD-10-CM | POA: Diagnosis not present

## 2022-01-06 DIAGNOSIS — N401 Enlarged prostate with lower urinary tract symptoms: Secondary | ICD-10-CM | POA: Diagnosis present

## 2022-01-06 DIAGNOSIS — I482 Chronic atrial fibrillation, unspecified: Secondary | ICD-10-CM | POA: Diagnosis present

## 2022-01-06 DIAGNOSIS — R791 Abnormal coagulation profile: Secondary | ICD-10-CM | POA: Diagnosis present

## 2022-01-06 DIAGNOSIS — G936 Cerebral edema: Secondary | ICD-10-CM | POA: Diagnosis present

## 2022-01-06 DIAGNOSIS — Z7901 Long term (current) use of anticoagulants: Secondary | ICD-10-CM | POA: Diagnosis not present

## 2022-01-06 DIAGNOSIS — R29810 Facial weakness: Secondary | ICD-10-CM | POA: Diagnosis present

## 2022-01-06 DIAGNOSIS — R569 Unspecified convulsions: Secondary | ICD-10-CM | POA: Diagnosis not present

## 2022-01-06 DIAGNOSIS — E0829 Diabetes mellitus due to underlying condition with other diabetic kidney complication: Principal | ICD-10-CM

## 2022-01-06 DIAGNOSIS — Z66 Do not resuscitate: Secondary | ICD-10-CM | POA: Diagnosis not present

## 2022-01-06 DIAGNOSIS — I611 Nontraumatic intracerebral hemorrhage in hemisphere, cortical: Secondary | ICD-10-CM | POA: Diagnosis not present

## 2022-01-06 DIAGNOSIS — E538 Deficiency of other specified B group vitamins: Secondary | ICD-10-CM | POA: Diagnosis present

## 2022-01-06 DIAGNOSIS — Z823 Family history of stroke: Secondary | ICD-10-CM

## 2022-01-06 DIAGNOSIS — Z634 Disappearance and death of family member: Secondary | ICD-10-CM

## 2022-01-06 DIAGNOSIS — I619 Nontraumatic intracerebral hemorrhage, unspecified: Secondary | ICD-10-CM | POA: Diagnosis present

## 2022-01-06 DIAGNOSIS — I5022 Chronic systolic (congestive) heart failure: Secondary | ICD-10-CM | POA: Diagnosis present

## 2022-01-06 DIAGNOSIS — I429 Cardiomyopathy, unspecified: Secondary | ICD-10-CM | POA: Diagnosis present

## 2022-01-06 DIAGNOSIS — Z515 Encounter for palliative care: Secondary | ICD-10-CM | POA: Diagnosis not present

## 2022-01-06 DIAGNOSIS — Z808 Family history of malignant neoplasm of other organs or systems: Secondary | ICD-10-CM

## 2022-01-06 DIAGNOSIS — R1310 Dysphagia, unspecified: Secondary | ICD-10-CM | POA: Diagnosis present

## 2022-01-06 DIAGNOSIS — R471 Dysarthria and anarthria: Secondary | ICD-10-CM | POA: Diagnosis present

## 2022-01-06 DIAGNOSIS — I480 Paroxysmal atrial fibrillation: Secondary | ICD-10-CM | POA: Diagnosis not present

## 2022-01-06 DIAGNOSIS — H9190 Unspecified hearing loss, unspecified ear: Secondary | ICD-10-CM | POA: Diagnosis present

## 2022-01-06 DIAGNOSIS — Z85820 Personal history of malignant melanoma of skin: Secondary | ICD-10-CM

## 2022-01-06 DIAGNOSIS — R918 Other nonspecific abnormal finding of lung field: Secondary | ICD-10-CM | POA: Diagnosis present

## 2022-01-06 DIAGNOSIS — E1122 Type 2 diabetes mellitus with diabetic chronic kidney disease: Secondary | ICD-10-CM | POA: Diagnosis present

## 2022-01-06 DIAGNOSIS — Y92009 Unspecified place in unspecified non-institutional (private) residence as the place of occurrence of the external cause: Secondary | ICD-10-CM | POA: Diagnosis not present

## 2022-01-06 DIAGNOSIS — H53461 Homonymous bilateral field defects, right side: Secondary | ICD-10-CM | POA: Diagnosis present

## 2022-01-06 DIAGNOSIS — C7931 Secondary malignant neoplasm of brain: Secondary | ICD-10-CM | POA: Diagnosis present

## 2022-01-06 DIAGNOSIS — R338 Other retention of urine: Secondary | ICD-10-CM | POA: Diagnosis present

## 2022-01-06 DIAGNOSIS — I6389 Other cerebral infarction: Secondary | ICD-10-CM | POA: Diagnosis not present

## 2022-01-06 DIAGNOSIS — G935 Compression of brain: Secondary | ICD-10-CM | POA: Diagnosis present

## 2022-01-06 DIAGNOSIS — N1831 Chronic kidney disease, stage 3a: Secondary | ICD-10-CM | POA: Diagnosis present

## 2022-01-06 DIAGNOSIS — H539 Unspecified visual disturbance: Secondary | ICD-10-CM | POA: Diagnosis present

## 2022-01-06 DIAGNOSIS — G8101 Flaccid hemiplegia affecting right dominant side: Secondary | ICD-10-CM | POA: Diagnosis present

## 2022-01-06 DIAGNOSIS — R29718 NIHSS score 18: Secondary | ICD-10-CM | POA: Diagnosis present

## 2022-01-06 DIAGNOSIS — Z20822 Contact with and (suspected) exposure to covid-19: Secondary | ICD-10-CM | POA: Diagnosis present

## 2022-01-06 DIAGNOSIS — Z794 Long term (current) use of insulin: Secondary | ICD-10-CM | POA: Diagnosis not present

## 2022-01-06 DIAGNOSIS — Z801 Family history of malignant neoplasm of trachea, bronchus and lung: Secondary | ICD-10-CM

## 2022-01-06 DIAGNOSIS — Z888 Allergy status to other drugs, medicaments and biological substances status: Secondary | ICD-10-CM

## 2022-01-06 DIAGNOSIS — I130 Hypertensive heart and chronic kidney disease with heart failure and stage 1 through stage 4 chronic kidney disease, or unspecified chronic kidney disease: Secondary | ICD-10-CM | POA: Diagnosis present

## 2022-01-06 DIAGNOSIS — D72829 Elevated white blood cell count, unspecified: Secondary | ICD-10-CM | POA: Diagnosis present

## 2022-01-06 DIAGNOSIS — E876 Hypokalemia: Secondary | ICD-10-CM | POA: Diagnosis present

## 2022-01-06 DIAGNOSIS — Z8249 Family history of ischemic heart disease and other diseases of the circulatory system: Secondary | ICD-10-CM

## 2022-01-06 DIAGNOSIS — I10 Essential (primary) hypertension: Secondary | ICD-10-CM | POA: Diagnosis not present

## 2022-01-06 DIAGNOSIS — G9389 Other specified disorders of brain: Secondary | ICD-10-CM | POA: Diagnosis not present

## 2022-01-06 DIAGNOSIS — E785 Hyperlipidemia, unspecified: Secondary | ICD-10-CM | POA: Diagnosis present

## 2022-01-06 LAB — CBC
HCT: 35 % — ABNORMAL LOW (ref 39.0–52.0)
Hemoglobin: 11.1 g/dL — ABNORMAL LOW (ref 13.0–17.0)
MCH: 31.4 pg (ref 26.0–34.0)
MCHC: 31.7 g/dL (ref 30.0–36.0)
MCV: 98.9 fL (ref 80.0–100.0)
Platelets: 274 10*3/uL (ref 150–400)
RBC: 3.54 MIL/uL — ABNORMAL LOW (ref 4.22–5.81)
RDW: 15.3 % (ref 11.5–15.5)
WBC: 11.8 10*3/uL — ABNORMAL HIGH (ref 4.0–10.5)
nRBC: 0 % (ref 0.0–0.2)

## 2022-01-06 LAB — LIPID PANEL
Cholesterol: 79 mg/dL (ref 0–200)
HDL: 24 mg/dL — ABNORMAL LOW (ref >40)
LDL Cholesterol: 33 mg/dL (ref 0–99)
Total CHOL/HDL Ratio: 3.3 RATIO
Triglycerides: 111 mg/dL (ref <150)
VLDL: 22 mg/dL (ref 0–40)

## 2022-01-06 LAB — COMPREHENSIVE METABOLIC PANEL
ALT: 14 U/L (ref 0–44)
AST: 13 U/L — ABNORMAL LOW (ref 15–41)
Albumin: 3.6 g/dL (ref 3.5–5.0)
Alkaline Phosphatase: 36 U/L — ABNORMAL LOW (ref 38–126)
Anion gap: 4 — ABNORMAL LOW (ref 5–15)
BUN: 19 mg/dL (ref 8–23)
CO2: 21 mmol/L — ABNORMAL LOW (ref 22–32)
Calcium: 8.4 mg/dL — ABNORMAL LOW (ref 8.9–10.3)
Chloride: 113 mmol/L — ABNORMAL HIGH (ref 98–111)
Creatinine, Ser: 1.51 mg/dL — ABNORMAL HIGH (ref 0.61–1.24)
GFR, Estimated: 46 mL/min — ABNORMAL LOW (ref >60)
Glucose, Bld: 182 mg/dL — ABNORMAL HIGH (ref 70–99)
Potassium: 4.3 mmol/L (ref 3.5–5.1)
Sodium: 138 mmol/L (ref 135–145)
Total Bilirubin: 1 mg/dL (ref 0.3–1.2)
Total Protein: 6.7 g/dL (ref 6.5–8.1)

## 2022-01-06 LAB — URINALYSIS, ROUTINE W REFLEX MICROSCOPIC
Bilirubin Urine: NEGATIVE
Glucose, UA: NEGATIVE mg/dL
Hgb urine dipstick: NEGATIVE
Ketones, ur: NEGATIVE mg/dL
Leukocytes,Ua: NEGATIVE
Nitrite: NEGATIVE
Protein, ur: 30 mg/dL — AB
Specific Gravity, Urine: 1.014 (ref 1.005–1.030)
pH: 5 (ref 5.0–8.0)

## 2022-01-06 LAB — I-STAT CHEM 8, ED
BUN: 19 mg/dL (ref 8–23)
Calcium, Ion: 1.17 mmol/L (ref 1.15–1.40)
Chloride: 109 mmol/L (ref 98–111)
Creatinine, Ser: 1.5 mg/dL — ABNORMAL HIGH (ref 0.61–1.24)
Glucose, Bld: 180 mg/dL — ABNORMAL HIGH (ref 70–99)
HCT: 36 % — ABNORMAL LOW (ref 39.0–52.0)
Hemoglobin: 12.2 g/dL — ABNORMAL LOW (ref 13.0–17.0)
Potassium: 4.4 mmol/L (ref 3.5–5.1)
Sodium: 142 mmol/L (ref 135–145)
TCO2: 21 mmol/L — ABNORMAL LOW (ref 22–32)

## 2022-01-06 LAB — RAPID URINE DRUG SCREEN, HOSP PERFORMED
Amphetamines: NOT DETECTED
Barbiturates: NOT DETECTED
Benzodiazepines: NOT DETECTED
Cocaine: NOT DETECTED
Opiates: NOT DETECTED
Tetrahydrocannabinol: NOT DETECTED

## 2022-01-06 LAB — CBG MONITORING, ED: Glucose-Capillary: 156 mg/dL — ABNORMAL HIGH (ref 70–99)

## 2022-01-06 LAB — RESP PANEL BY RT-PCR (FLU A&B, COVID) ARPGX2
Influenza A by PCR: NEGATIVE
Influenza B by PCR: NEGATIVE
SARS Coronavirus 2 by RT PCR: NEGATIVE

## 2022-01-06 LAB — DIFFERENTIAL
Abs Immature Granulocytes: 0.06 10*3/uL (ref 0.00–0.07)
Basophils Absolute: 0.1 10*3/uL (ref 0.0–0.1)
Basophils Relative: 1 %
Eosinophils Absolute: 0.2 10*3/uL (ref 0.0–0.5)
Eosinophils Relative: 2 %
Immature Granulocytes: 1 %
Lymphocytes Relative: 24 %
Lymphs Abs: 2.9 10*3/uL (ref 0.7–4.0)
Monocytes Absolute: 1.1 10*3/uL — ABNORMAL HIGH (ref 0.1–1.0)
Monocytes Relative: 9 %
Neutro Abs: 7.6 10*3/uL (ref 1.7–7.7)
Neutrophils Relative %: 63 %

## 2022-01-06 LAB — HEMOGLOBIN A1C
Hgb A1c MFr Bld: 8.3 % — ABNORMAL HIGH (ref 4.8–5.6)
Mean Plasma Glucose: 191.51 mg/dL

## 2022-01-06 LAB — PROTIME-INR
INR: 1.5 — ABNORMAL HIGH (ref 0.8–1.2)
Prothrombin Time: 17.9 seconds — ABNORMAL HIGH (ref 11.4–15.2)

## 2022-01-06 LAB — APTT: aPTT: 34 seconds (ref 24–36)

## 2022-01-06 LAB — MRSA NEXT GEN BY PCR, NASAL: MRSA by PCR Next Gen: NOT DETECTED

## 2022-01-06 LAB — ETHANOL: Alcohol, Ethyl (B): <10 mg/dL (ref <10)

## 2022-01-06 MED ORDER — PANTOPRAZOLE SODIUM 40 MG IV SOLR
40.0000 mg | Freq: Every day | INTRAVENOUS | Status: DC
  Administered 2022-01-06 – 2022-01-10 (×5): 40 mg via INTRAVENOUS
  Filled 2022-01-06 (×5): qty 10

## 2022-01-06 MED ORDER — SODIUM CHLORIDE 0.9 % IV SOLN: INTRAVENOUS | Status: DC

## 2022-01-06 MED ORDER — ACETAMINOPHEN 650 MG RE SUPP
650.0000 mg | RECTAL | Status: DC | PRN
  Administered 2022-01-07 – 2022-01-08 (×2): 650 mg via RECTAL
  Filled 2022-01-06 (×2): qty 1

## 2022-01-06 MED ORDER — SENNOSIDES-DOCUSATE SODIUM 8.6-50 MG PO TABS
1.0000 | ORAL_TABLET | Freq: Two times a day (BID) | ORAL | Status: DC
  Administered 2022-01-08 – 2022-01-10 (×4): 1 via ORAL
  Filled 2022-01-06 (×5): qty 1

## 2022-01-06 MED ORDER — IOHEXOL 350 MG/ML SOLN
65.0000 mL | Freq: Once | INTRAVENOUS | Status: AC | PRN
  Administered 2022-01-06: 65 mL via INTRAVENOUS

## 2022-01-06 MED ORDER — NICARDIPINE HCL IN NACL 20-0.86 MG/200ML-% IV SOLN
3.0000 mg/h | INTRAVENOUS | Status: DC
  Filled 2022-01-06: qty 200

## 2022-01-06 MED ORDER — LORAZEPAM 2 MG/ML IJ SOLN: 1.0000 mg | Freq: Once | INTRAMUSCULAR | Status: DC | PRN

## 2022-01-06 MED ORDER — ACETAMINOPHEN 160 MG/5ML PO SOLN
650.0000 mg | ORAL | Status: DC | PRN
  Administered 2022-01-08: 650 mg
  Filled 2022-01-06: qty 20.3

## 2022-01-06 MED ORDER — CLEVIDIPINE BUTYRATE 0.5 MG/ML IV EMUL: 0.0000 mg/h | INTRAVENOUS | Status: DC

## 2022-01-06 MED ORDER — EMPTY CONTAINERS FLEXIBLE MISC
900.0000 mg | Freq: Once | Status: AC
  Administered 2022-01-06: 900 mg via INTRAVENOUS
  Filled 2022-01-06: qty 90

## 2022-01-06 MED ORDER — STROKE: EARLY STAGES OF RECOVERY BOOK: Freq: Once | Status: AC

## 2022-01-06 MED ORDER — DEXMEDETOMIDINE HCL IN NACL 400 MCG/100ML IV SOLN: 0.4000 ug/kg/h | INTRAVENOUS | Status: DC

## 2022-01-06 MED ORDER — ACETAMINOPHEN 325 MG PO TABS
650.0000 mg | ORAL_TABLET | ORAL | Status: DC | PRN
  Administered 2022-01-08 – 2022-01-10 (×5): 650 mg via ORAL
  Filled 2022-01-06 (×8): qty 2

## 2022-01-06 MED ORDER — EMPTY CONTAINERS FLEXIBLE MISC: 1800.0000 mg | Freq: Once | Status: DC

## 2022-01-06 MED ORDER — NICARDIPINE HCL IN NACL 20-0.86 MG/200ML-% IV SOLN: 3.0000 mg/h | INTRAVENOUS | Status: DC | PRN

## 2022-01-06 MED ORDER — LABETALOL HCL 5 MG/ML IV SOLN: 20.0000 mg | Freq: Once | INTRAVENOUS | Status: DC

## 2022-01-06 MED ORDER — ACETAMINOPHEN 650 MG RE SUPP
650.0000 mg | Freq: Once | RECTAL | Status: AC
  Administered 2022-01-06: 650 mg via RECTAL
  Filled 2022-01-06: qty 1

## 2022-01-06 NOTE — ED Notes (Signed)
CODE STROKE beeped and called CT @ 1030.

## 2022-01-06 NOTE — Progress Notes (Signed)
Verbal order from Janine Ores NP and Dr Rory Percy to give PRN ativan IV before going down to MRI. MRI currently not scheduled. If PRN Ativan does not work contact night shift on call neurologist and ask about a possible precedex gtt. Will forward this information to night shift RN.   Montez Hageman RN

## 2022-01-06 NOTE — Progress Notes (Signed)
1032: Code stroke activated via cart. Patient currently in CT at this time.   1040: paged Dr. Erlinda Hong about code stroke.  1046: Pt back in room. Dr. Erlinda Hong evaluating virtually at bedside.   1050:  [place]: 01/05/22 at 2100  FANG D (+) Field cut: No Aphasia: No Neglect: No Dense Hemiparesis: Yes  MRS: 0  1103: off call

## 2022-01-06 NOTE — H&P (Signed)
Neurology H&P   CC: Right-sided weakness and numbness found down at home  History is obtained from: Chart  HPI: Justin [name] is a 81 y.o. male past medical history of atrial fibrillation on Eliquis, hypertension, CHF, CKD, diabetes presented to the emergency room at [facility] with last known well at 9 PM last night with complaints of right-sided numbness weakness when he was found down on the floor.  He also complained of numbness and tingling.  According to the family member that was seen there in the other hospital-my colleague, who saw the patient on telestroke was told that there were insects crawling on his face and he was unable to swat them off.  EMS was called, patient was found to have a left gaze preference, right sided numbness and weakness and he was brought in to the ER for emergent evaluation as a code stroke.  Seen and evaluated by telestroke.  CT head revealed left parietal occipital ICH.  Eliquis reversed with Andexxa.  Blood pressure goals set 130-150 and transferred over to [facility] for further management. Patient unable to provide reliable history   LKW: 9 PM 01/05/2022 IV thrombolysis given?: no, ICH Premorbid modified Rankin scale (mRS): Unable to ascertain  ICH Score: 2 -1 for size 1 for age   ROS: Unable to obtain due to altered mental status.   Past Medical History:  Diagnosis Date   Atrial fibrillation ([place])    with rapid ventricular response   Cellulitis of left lower leg    CHF (congestive heart failure) ([place]) 91/47   systolic   Diabetes mellitus    Epistaxis    Hard of hearing    Hematuria    HTN (hypertension)    Hyperlipidemia    Hypertension    IDA (iron deficiency anemia)    Nasal sore    inner  nares and external right scabbed lesion- started Doxycycline 08/08/12   PFO (patent foramen ovale)    Urinary retention    indwelling foley   Vitamin B 12 deficiency    Family History  Problem Relation Age of Onset   Lung cancer Father     Hypertension Mother    CVA Mother    Cancer - Colon Sister    Social History:   reports that he has never smoked. He has never used smokeless tobacco. He reports that he does not drink alcohol and does not use drugs.  Medications  Current Facility-Administered Medications:    [START ON 01/07/2022]  stroke: early stages of recovery book, , Does not apply, Once, Amie Portland, MD   acetaminophen (TYLENOL) tablet 650 mg, 650 mg, Oral, Q4H PRN **OR** acetaminophen (TYLENOL) 160 MG/5ML solution 650 mg, 650 mg, Per Tube, Q4H PRN **OR** acetaminophen (TYLENOL) suppository 650 mg, 650 mg, Rectal, Q4H PRN, Amie Portland, MD   labetalol (NORMODYNE) injection 20 mg, 20 mg, Intravenous, Once **AND** clevidipine (CLEVIPREX) infusion 0.5 mg/mL, 0-21 mg/hr, Intravenous, Continuous, Amie Portland, MD   nicardipine (CARDENE) '20mg'$  in 0.86% saline 287m IV infusion (0.1 mg/ml), 3-15 mg/hr, Intravenous, PRN, DGodfrey Pick MD   pantoprazole (PROTONIX) injection 40 mg, 40 mg, Intravenous, QHS, AAmie Portland MD   senna-docusate (Senokot-S) tablet 1 tablet, 1 tablet, Oral, BID, AAmie Portland MD   Exam: Current vital signs: BP (!) 145/77 (BP Location: Left Arm)   Pulse (!) 50   Temp 98.9 F (37.2 C) (Oral)   Resp 18   SpO2 98%  Vital signs in last 24 hours: Temp:  [98.9  F (37.2 C)] 98.9 F (37.2 C) (07/12 1500) Pulse Rate:  [49-104] 50 (07/12 1500) Resp:  [14-23] 18 (07/12 1500) BP: (103-147)/(55-80) 145/77 (07/12 1500) SpO2:  [96 %-100 %] 98 % (07/12 1500) General: Awake alert in no distress HEENT: Normocephalic atraumatic Lungs: Clear Cardiovascular: RRR Abdomen nondistended nontender Neurological exam He is awake alert opens his eyes to voice.  He is able to tell me his name correctly.  He is not able to tell me his age correctly.  He has mild dysarthria.  He is definitely hard of hearing. His repetition is intact. Naming is inconsistent Cranial nerves: Pupils are equal sluggishly reactive with  disconjugate gaze with the left eye exotropia.  He has leftward gaze preference with that with inability to cross to the right.  Right homonymous hemianopsia, no significant facial weakness. Motor examination with no drift in the left upper and lower extremity.  Right upper extremity he does have some distal strength but proximally the arm falls onto the bed within a second or 2.  Right lower extremity at best has 2/5 strength upon repeated exam. Sensory exam: Diminished on the right.  Definitely extinguishing sensory and visual stimuli on the right. Difficult to assess coordination Gait testing deferred NIH stroke scale 1a Level of Conscious.: 0 1b LOC Questions: 2 1c LOC Commands: 0 2 Best Gaze: 1 3 Visual: 2 4 Facial Palsy: 0 5a Motor Arm - left: 0 5b Motor Arm - Right: 3 6a Motor Leg - Left: 0 6b Motor Leg - Right: 2 7 Limb Ataxia: 0 8 Sensory: 2 9 Best Language: 1 10 Dysarthria: 1 11 Extinct. and Inatten.: 2 TOTAL: 16   Labs I have reviewed labs in epic and the results pertinent to this consultation are:  CBC    Component Value Date/Time   WBC 11.8 (H) 01/06/2022 1035   RBC 3.54 (L) 01/06/2022 1035   HGB 12.2 (L) 01/06/2022 1114   HCT 36.0 (L) 01/06/2022 1114   HCT 43 11/18/2010 1043   PLT 274 01/06/2022 1035   MCV 98.9 01/06/2022 1035   MCH 31.4 01/06/2022 1035   MCHC 31.7 01/06/2022 1035   RDW 15.3 01/06/2022 1035   LYMPHSABS 2.9 01/06/2022 1035   MONOABS 1.1 (H) 01/06/2022 1035   EOSABS 0.2 01/06/2022 1035   BASOSABS 0.1 01/06/2022 1035    CMP     Component Value Date/Time   NA 142 01/06/2022 1114   NA 141 11/18/2010 1045   K 4.4 01/06/2022 1114   K 4.6 11/18/2010 1045   CL 109 01/06/2022 1114   CL 106 11/18/2010 1045   CO2 21 (L) 01/06/2022 1035   CO2 24 11/18/2010 1045   GLUCOSE 180 (H) 01/06/2022 1114   BUN 19 01/06/2022 1114   BUN 27 (A) 11/18/2010 1045   CREATININE 1.50 (H) 01/06/2022 1114   CREATININE 1.08 07/27/2017 1354   CALCIUM 8.4 (L)  01/06/2022 1035   CALCIUM 10.2 11/18/2010 1045   PROT 6.7 01/06/2022 1035   ALBUMIN 3.6 01/06/2022 1035   ALBUMIN 4.4 11/18/2010 1045   AST 13 (L) 01/06/2022 1035   AST 31 11/18/2010 1045   ALT 14 01/06/2022 1035   ALKPHOS 36 (L) 01/06/2022 1035   ALKPHOS 51 11/18/2010 1045   BILITOT 1.0 01/06/2022 1035   BILITOT 0.5 11/18/2010 1045   GFRNONAA 46 (L) 01/06/2022 1035   GFRNONAA 65 07/27/2017 1354   GFRAA >60 11/10/2017 0508   GFRAA 76 07/27/2017 1354   Imaging I have reviewed the images  obtained:  [facility] with a around 64 cc left parietal occipital ICH.  2 mm midline shift.  Abnormal hypodensity in the right temporal lobe periventricular white matter-indeterminate in etiology, attention to the site on MRI is recommended.  No IVH.  Assessment: 81 year old man with above past medical history that includes of being on Eliquis for A-fib presenting for evaluation of right-sided numbness and weakness having been found down on the ground.  CT head with a left parietal occipital ICH. ICH score 2. Etiology of bleed unclear-hypertensive versus underlying vascular malformation or mass.  Recommendations: Admit to ICU-stroke service CT head and CT angiography head and neck at 1500 hrs. for stability and evaluation of underlying vascular malformation/mass MRI brain with and without contrast Blood pressure management-Target systolic blood pressure goal 1 30-1 50. Eliquis has been reversed with Andexxa Head of bed elevation greater than 30 degrees Monitor for any evidence of clinical seizures-melena but epileptics if has any seizures. CKD 3: Gentle hydration Check labs and replete electrolytes as necessary Stroke work-up to include 2D echo, A1c, lipid panel. PT OT speech therapy N.p.o. until cleared by bedside swallow formal speech therapy evaluation. Full code Strict bedrest DVT prophylaxis SCDs only-no antiplatelets or anticoagulants given the ICH. GI  prophylaxis: PPI Bowel: Docusate senna  -- Amie Portland, MD Neurologist [facility] Pager: [phone]  Present on admission Intracerebral hemorrhage, coagulopathy due to anticoagulant use, atrial fibrillation, CKD 3, systolic heart failure hemiplegia/hemiparesis, dysphagia/dysarthria.  CRITICAL CARE ATTESTATION Performed by: Amie Portland, MD Total critical care time: 45 minutes Critical care time was exclusive of separately billable procedures and treating other patients and/or supervising APPs/Residents/Students Critical care was necessary to treat or prevent imminent or life-threatening deterioration due to [place]  This patient is critically ill and at significant risk for neurological worsening and/or death and care requires constant monitoring. Critical care was time spent personally by me on the following activities: development of treatment plan with patient and/or surrogate as well as nursing, discussions with consultants, evaluation of patient's response to treatment, examination of patient, obtaining history from patient or surrogate, ordering and performing treatments and interventions, ordering and review of laboratory studies, ordering and review of radiographic studies, pulse oximetry, re-evaluation of patient's condition, participation in multidisciplinary rounds and medical decision making of high complexity in the care of this patient.

## 2022-01-06 NOTE — ED Triage Notes (Signed)
Pt was brought in by ems. Last known well at 9 pm last night, was found on floor this am around 0950 pt is alert but confused, has difficulty following commands and is hard of hearing, MD at bedside

## 2022-01-06 NOTE — ED Provider Notes (Addendum)
[facility] EMERGENCY DEPARTMENT Provider Note   CSN: [id number] Arrival date & time: 01/06/22  1020     History  Chief Complaint  Patient presents with   Altered Mental Status    Justin [name] is a 81 y.o. male.   Altered Mental Status Presenting symptoms: confusion   Associated symptoms: headaches and weakness   Patient presents for altered mental status.  Medical history includes DM, HTN, GERD, atrial fibrillation, CHF.  Patient's sister, [name], reports that she saw him yesterday and talk to him on the phone last night.  He was in his normal state of health at that time.  Last night's phone call was at 9 PM.  This morning, he reportedly called his daughter but was unable to speak on the phone.  Patient's daughter called the patient's other daughter who went to check on him.  Patient's daughter, [name], found him on the floor confused.     Home Medications Prior to Admission medications   Medication Sig Start Date End Date Taking? Authorizing Provider  acetaminophen (TYLENOL) 500 MG tablet Take 500 mg by mouth every 6 (six) hours as needed for moderate pain or headache.   Yes [provider]  apixaban (ELIQUIS) 5 MG TABS tablet Take 1 tablet (5 mg total) by mouth 2 (two) times daily. 09/29/21  Yes Branch, Alphonse Guild, MD  atorvastatin (LIPITOR) 20 MG tablet TAKE 1 TABLET BY MOUTH EVERY DAY AT 6 PM FOR HIGH CHOLESTEROL.STOP GEMFIBROZIL Patient taking differently: Take 20 mg by mouth daily. 06/11/20  Yes Branch, Alphonse Guild, MD  carvedilol (COREG) 25 MG tablet TAKE 1 TABLET BY MOUTH TWICE DAILY FOR YOUR HEART Patient taking differently: Take 25 mg by mouth 2 (two) times daily. 10/11/19  Yes Branch, Alphonse Guild, MD  digoxin (LANOXIN) 0.125 MG tablet TAKE 1 TABLET BY MOUTH EVERY DAY FOR YOUR HEART Patient taking differently: Take 0.125 mg by mouth daily. 10/11/19  Yes BranchAlphonse Guild, MD  Ferrous Sulfate (IRON PO) Take 45 mg by mouth daily.   Yes [provider]   glipiZIDE (GLUCOTROL) 10 MG tablet Take 0.5 tablets (5 mg total) by mouth 2 (two) times daily before a meal. Take adjusted dose of glipizide until follow up with PCP in 5 days Patient taking differently: Take 5 mg by mouth 2 (two) times daily before a meal. 08/21/15  Yes Barton Dubois, MD  latanoprost (XALATAN) 0.005 % ophthalmic solution Place 1 drop into both eyes at bedtime. 09/25/20  Yes [provider]  losartan (COZAAR) 25 MG tablet Take 1/2 (one-half) tablet by mouth once daily Patient taking differently: Take 12.5 mg by mouth daily. 11/21/20  Yes BranchAlphonse Guild, MD  metFORMIN (GLUCOPHAGE-XR) 500 MG 24 hr tablet Take 2 tablets (1,000 mg total) by mouth 2 (two) times daily. Stop this medication until you follow up with PCP in 5 days Patient taking differently: Take 1,000 mg by mouth 2 (two) times daily. 08/21/15  Yes Barton Dubois, MD  pioglitazone (ACTOS) 45 MG tablet Take 45 mg by mouth daily.   Yes [provider]  spironolactone (ALDACTONE) 25 MG tablet Take 1 tablet by mouth once daily Patient taking differently: Take 25 mg by mouth daily. 07/27/21  Yes Branch, Alphonse Guild, MD  vitamin B-12 (CYANOCOBALAMIN) 1000 MCG tablet Take 1,000 mcg by mouth daily.    Yes [provider]      Allergies    Niacin and related    Review of Systems   Review of Systems  Unable to perform ROS: Mental status change  Neurological:  Positive for weakness, numbness and headaches.  Hematological:  Bruises/bleeds easily (On Eliquis).  Psychiatric/Behavioral:  Positive for confusion.     Physical Exam Updated Vital Signs BP 122/68   Pulse 63   Temp 99 F (37.2 C) (Axillary)   Resp (!) 26   Wt 99.5 kg Comment: from office visit June 2023  SpO2 94%   BMI 29.75 kg/m  Physical Exam Vitals and nursing note reviewed.  Constitutional:      General: He is not in acute distress.    Appearance: He is well-developed and normal weight. He is ill-appearing. He is not  toxic-appearing or diaphoretic.  HENT:     Head: Normocephalic and atraumatic.     Right Ear: External ear normal.     Left Ear: External ear normal.     Nose: Nose normal.     Mouth/Throat:     Mouth: Mucous membranes are moist.     Pharynx: Oropharynx is clear.  Eyes:     General: Visual field deficit present.     Extraocular Movements: Extraocular movements intact.     Conjunctiva/sclera: Conjunctivae normal.  Cardiovascular:     Rate and Rhythm: Normal rate and regular rhythm.     Heart sounds: No murmur heard. Pulmonary:     Effort: Pulmonary effort is normal. No respiratory distress.     Breath sounds: Normal breath sounds. No wheezing or rales.  Abdominal:     General: There is no distension.     Palpations: Abdomen is soft.     Tenderness: There is no abdominal tenderness.  Musculoskeletal:        General: No swelling. Normal range of motion.     Cervical back: Normal range of motion and neck supple.     Right lower leg: No edema.     Left lower leg: No edema.  Skin:    General: Skin is warm and dry.     Coloration: Skin is not jaundiced or pale.  Neurological:     Mental Status: He is alert.     Cranial Nerves: Dysarthria present.     Sensory: Sensory deficit (Right hemibody loss of sensation) present.     Motor: Weakness (Right hemibody weakness) present.     Comments: Right-sided neglect     ED Results / Procedures / Treatments   Labs (all labs ordered are listed, but only abnormal results are displayed) Labs Reviewed  PROTIME-INR - Abnormal; Notable for the following components:      Result Value   Prothrombin Time 17.9 (*)    INR 1.5 (*)    All other components within normal limits  CBC - Abnormal; Notable for the following components:   WBC 11.8 (*)    RBC 3.54 (*)    Hemoglobin 11.1 (*)    HCT 35.0 (*)    All other components within normal limits  DIFFERENTIAL - Abnormal; Notable for the following components:   Monocytes Absolute 1.1 (*)    All  other components within normal limits  COMPREHENSIVE METABOLIC PANEL - Abnormal; Notable for the following components:   Chloride 113 (*)    CO2 21 (*)    Glucose, Bld 182 (*)    Creatinine, Ser 1.51 (*)    Calcium 8.4 (*)    AST 13 (*)    Alkaline Phosphatase 36 (*)    GFR, Estimated 46 (*)    Anion gap 4 (*)    All  other components within normal limits  URINALYSIS, ROUTINE W REFLEX MICROSCOPIC - Abnormal; Notable for the following components:   Protein, ur 30 (*)    Bacteria, UA RARE (*)    All other components within normal limits  LIPID PANEL - Abnormal; Notable for the following components:   HDL 24 (*)    All other components within normal limits  HEMOGLOBIN A1C - Abnormal; Notable for the following components:   Hgb A1c MFr Bld 8.3 (*)    All other components within normal limits  GLUCOSE, CAPILLARY - Abnormal; Notable for the following components:   Glucose-Capillary 143 (*)    All other components within normal limits  CBC - Abnormal; Notable for the following components:   WBC 16.0 (*)    RBC 3.41 (*)    Hemoglobin 10.7 (*)    HCT 32.4 (*)    All other components within normal limits  BASIC METABOLIC PANEL - Abnormal; Notable for the following components:   Chloride 114 (*)    CO2 20 (*)    Glucose, Bld 155 (*)    Creatinine, Ser 1.27 (*)    Calcium 8.3 (*)    GFR, Estimated 56 (*)    All other components within normal limits  GLUCOSE, CAPILLARY - Abnormal; Notable for the following components:   Glucose-Capillary 157 (*)    All other components within normal limits  GLUCOSE, CAPILLARY - Abnormal; Notable for the following components:   Glucose-Capillary 159 (*)    All other components within normal limits  CBC - Abnormal; Notable for the following components:   WBC 14.2 (*)    RBC 3.30 (*)    Hemoglobin 10.3 (*)    HCT 31.8 (*)    All other components within normal limits  BASIC METABOLIC PANEL - Abnormal; Notable for the following components:   Chloride  115 (*)    CO2 20 (*)    Glucose, Bld 113 (*)    Creatinine, Ser 1.29 (*)    Calcium 7.9 (*)    GFR, Estimated 55 (*)    All other components within normal limits  GLUCOSE, CAPILLARY - Abnormal; Notable for the following components:   Glucose-Capillary 192 (*)    All other components within normal limits  GLUCOSE, CAPILLARY - Abnormal; Notable for the following components:   Glucose-Capillary 162 (*)    All other components within normal limits  GLUCOSE, CAPILLARY - Abnormal; Notable for the following components:   Glucose-Capillary 127 (*)    All other components within normal limits  GLUCOSE, CAPILLARY - Abnormal; Notable for the following components:   Glucose-Capillary 130 (*)    All other components within normal limits  CBG MONITORING, ED - Abnormal; Notable for the following components:   Glucose-Capillary 156 (*)    All other components within normal limits  I-STAT CHEM 8, ED - Abnormal; Notable for the following components:   Creatinine, Ser 1.50 (*)    Glucose, Bld 180 (*)    TCO2 21 (*)    Hemoglobin 12.2 (*)    HCT 36.0 (*)    All other components within normal limits  RESP PANEL BY RT-PCR (FLU A&B, COVID) ARPGX2  MRSA NEXT GEN BY PCR, NASAL  ETHANOL  APTT  RAPID URINE DRUG SCREEN, HOSP PERFORMED  DIGOXIN LEVEL    EKG EKG Interpretation  Date/Time:  Wednesday January 06 2022 11:08:27 EDT Ventricular Rate:  59 PR Interval:    QRS Duration: 151 QT Interval:  457 QTC Calculation: 453 R  Axis:   138 Text Interpretation: Atrial fibrillation Right bundle branch block No significant change since last tracing Confirmed by Thayer Jew [phone]) on 01/08/2022 6:40:06 AM  Radiology DG HIP UNILAT WITH PELVIS 2-3 VIEWS RIGHT  Result Date: 01/07/2022 CLINICAL DATA:  Right hip pain. EXAM: DG HIP (WITH OR WITHOUT PELVIS) 2-3V RIGHT COMPARISON:  None Available. FINDINGS: Mildly decreased bone mineralization. Mild bilateral femoroacetabular joint space narrowing and  peripheral osteophytosis. Plate and screw fixation of the superior pubic symphysis. Single transverse screw fixation of the left sacroiliac joint and left hemisacrum. Likely moderate bilateral sacroiliac joint space narrowing. No acute fracture is seen.  No dislocation. IMPRESSION: No acute fracture. Electronically Signed   By: Yvonne Kendall M.D.   On: 01/07/2022 21:38   DG Knee Right Port  Result Date: 01/07/2022 CLINICAL DATA:  Right knee pain.  Best obtainable level images. EXAM: PORTABLE RIGHT KNEE - 1-2 VIEW COMPARISON:  None Available. FINDINGS: Moderate medial compartment joint space narrowing. Moderate mediolateral compartment chondrocalcinosis. Minimal chronic enthesopathic change at the quadriceps insertion of the patella. No joint effusion. No acute fracture is seen. No dislocation. IMPRESSION: Moderate medial compartment osteoarthritis.  Chondrocalcinosis. Electronically Signed   By: Yvonne Kendall M.D.   On: 01/07/2022 21:35   MR BRAIN W WO CONTRAST  Result Date: 01/07/2022 CLINICAL DATA:  Neuro deficit, acute, stroke suspected. EXAM: MRI HEAD WITHOUT AND WITH CONTRAST TECHNIQUE: Multiplanar, multiecho pulse sequences of the brain and surrounding structures were obtained without and with intravenous contrast. CONTRAST:  40m GADAVIST GADOBUTROL 1 MMOL/ML IV SOLN COMPARISON:  Head CT December 07, 2021. FINDINGS: Severely motion degraded study. Brain: Large intraparenchymal hemorrhage centered in the left parietal lobe with hematocrit level, measuring approximately 5.8 x 5.6 x 4.2 cm. Ill-defined heterogeneous mass lesion is noted in the medial/aspect of the lesion measuring at least 2.6 x 1.8 cm. Surrounding vasogenic edema with mass effect on the atrium of the left lateral ventricle and adjacent cerebral sulci without evidence of entrapment. Area of T2 hyperintensity is observed in the right temporal lobe without clear underlying lesion. No hydrocephalus or extra-axial collection. No acute territory  infarct. Postcontrast images are nondiagnostic. Vascular: Normal flow voids. 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 Sinuses/Orbits: Bilateral lens surgery. Paranasal sinuses are clear. Other: None. IMPRESSION: 1. Severely motion degraded study redemonstrated large left parietal intraparenchymal hematoma measuring up to 5.8 cm with local mass effect without evidence of hydrocephalus or entrapment. A 2.6 cm mass lesion is identified in the medial aspect of the intraparenchymal hemorrhage. 2. T2 hyperintensity within the white matter of the right temporal lobe. Underlying lesion cannot be excluded given severe motion artifact. 3. Recommend repeat study when clinically appropriate. Electronically Signed   By: KPedro EarlsM.D.   On: 01/07/2022 13:39   ECHOCARDIOGRAM COMPLETE  Result Date: 01/07/2022    ECHOCARDIOGRAM REPORT   Patient Name:   JMACE WEINBERGDYE Date of Exam: 01/07/2022 Medical Rec #:  [id number]  Height:       72.0 in Accession #:    [id number] Weight:       219.4 lb Date of Birth:  [birth year]  BSA:          2.216 m Patient Age:    89years    BP:  141/76 mmHg Patient Gender: M           HR:           103 bpm. Exam Location:  Inpatient Procedure: 2D Echo, Cardiac Doppler and Color Doppler Indications:    Stroke  History:        Patient has no prior history of Echocardiogram examinations.                 CHF; Risk Factors:Hypertension and Diabetes.  Sonographer:    Jyl Heinz Referring Phys: [id number] ASHISH ARORA  Sonographer Comments: Image acquisition challenging due to uncooperative patient. IMPRESSIONS  1. Left ventricular ejection fraction, by estimation, is 60 to 65%. The left ventricle has normal function. The left ventricle has no regional wall motion abnormalities. Left ventricular diastolic parameters are  indeterminate.  2. Peak RV-RA gradient 30 mmHg. Unable to visualize IVC. Right ventricular systolic function is normal. The right ventricular size is normal.  3. Left atrial size was moderately dilated.  4. Right atrial size was moderately dilated.  5. The mitral valve is normal in structure. Trivial mitral valve regurgitation. No evidence of mitral stenosis.  6. The aortic valve is tricuspid. There is mild calcification of the aortic valve. Aortic valve regurgitation is not visualized. No aortic stenosis is present.  7. The patient was in atrial fibrillation. FINDINGS  Left Ventricle: Left ventricular ejection fraction, by estimation, is 60 to 65%. The left ventricle has normal function. The left ventricle has no regional wall motion abnormalities. The left ventricular internal cavity size was normal in size. There is  no left ventricular hypertrophy. Left ventricular diastolic parameters are indeterminate. Right Ventricle: Peak RV-RA gradient 30 mmHg. Unable to visualize IVC. The right ventricular size is normal. No increase in right ventricular wall thickness. Right ventricular systolic function is normal. Left Atrium: Left atrial size was moderately dilated. Right Atrium: Right atrial size was moderately dilated. Pericardium: There is no evidence of pericardial effusion. Mitral Valve: The mitral valve is normal in structure. Mild mitral annular calcification. Trivial mitral valve regurgitation. No evidence of mitral valve stenosis. Tricuspid Valve: The tricuspid valve is normal in structure. Tricuspid valve regurgitation is trivial. Aortic Valve: The aortic valve is tricuspid. There is mild calcification of the aortic valve. Aortic valve regurgitation is not visualized. No aortic stenosis is present. Aortic valve peak gradient measures 10.5 mmHg. Pulmonic Valve: The pulmonic valve was normal in structure. Pulmonic valve regurgitation is not visualized. Aorta: The aortic root is normal in size and structure.  Venous: The inferior vena cava was not well visualized. IAS/Shunts: No atrial level shunt detected by color flow Doppler.  LEFT VENTRICLE PLAX 2D LVIDd:         5.40 cm     Diastology LVIDs:         3.60 cm     LV e' medial:    10.10 cm/s LV PW:         1.00 cm     LV E/e' medial:  9.9 LV IVS:        0.90 cm     LV e' lateral:   9.17 cm/s LVOT diam:     2.20 cm     LV E/e' lateral: 10.9 LV SV:         69 LV SV Index:   31 LVOT Area:     3.80 cm  LV Volumes (MOD) LV vol d, MOD A2C: 91.9 ml LV vol d, MOD A4C: 70.0 ml LV vol  s, MOD A2C: 38.8 ml LV vol s, MOD A4C: 30.7 ml LV SV MOD A2C:     53.1 ml LV SV MOD A4C:     70.0 ml LV SV MOD BP:      47.6 ml RIGHT VENTRICLE RV Basal diam:  3.60 cm RV Mid diam:    3.40 cm RV S prime:     12.70 cm/s TAPSE (M-mode): 1.8 cm LEFT ATRIUM              Index        RIGHT ATRIUM           Index LA diam:        5.10 cm  2.30 cm/m   RA Area:     24.00 cm LA Vol (A2C):   102.0 ml 46.03 ml/m  RA Volume:   71.10 ml  32.08 ml/m LA Vol (A4C):   92.9 ml  41.92 ml/m LA Biplane Vol: 99.4 ml  44.85 ml/m  AORTIC VALVE AV Area (Vmax): 2.58 cm AV Vmax:        162.00 cm/s AV Peak Grad:   10.5 mmHg LVOT Vmax:      110.00 cm/s LVOT Vmean:     73.200 cm/s LVOT VTI:       0.182 m  AORTA Ao Root diam: 3.40 cm Ao Asc diam:  3.30 cm MITRAL VALVE               TRICUSPID VALVE MV Area (PHT): 4.68 cm    TR Peak grad:   30.0 mmHg MV Decel Time: 162 msec    TR Vmax:        274.00 cm/s MV E velocity: 99.80 cm/s                            SHUNTS                            Systemic VTI:  0.18 m                            Systemic Diam: 2.20 cm Dalton McleanMD Electronically signed by Franki Monte Signature Date/Time: 01/07/2022/10:06:15 AM    Final    EEG adult  Result Date: 01/07/2022 Lora Havens, MD     01/07/2022  9:02 AM Patient Name: HERNY SCURLOCK MRN: [id number] Epilepsy Attending: Lora Havens Referring Physician/Provider: Greta Doom, MD Date: 01/07/2022 Duration: 22.32 mins  Patient history: 81 year old man presenting for evaluation of right-sided numbness and weakness having been found down on the ground.  CT head with a left parietal occipital ICH. EEG to evaluate for seizure Level of alertness:  lethargic AEDs during EEG study: None Technical aspects: This EEG study was done with scalp electrodes positioned according to the 10-20 International system of electrode placement. Electrical activity was acquired at a sampling rate of '500Hz'$  and reviewed with a high frequency filter of '70Hz'$  and a low frequency filter of '1Hz'$ . EEG data were recorded continuously and digitally stored. Description: EEG showed near continuous 6 to 8 Hz theta alpha activity in left hemisphere.  There is EEG attenuation in right hemisphere.  There is also intermittent sharply contoured rhythmic 2 to 3 Hz delta slowing in left hemisphere without definite evolution.  Hyperventilation and photic stimulation were not performed.   ABNORMALITY -Lateralized rhythmic delta activity, left hemisphere -  Continuous slow IMPRESSION: This study is suggestive of cortical dysfunction in left hemisphere which is on the ictal-interictal continuum with low to intermediate potential for seizures. Additionally there is moderate to severe diffuse encephalopathy, nonspecific to etiology.  No definite seizures were seen throughout the recording. If suspicion for ictal-interictal activity remains a concern, a prolonged study can be considered. Lora Havens   CT ANGIO HEAD NECK W WO CM  Result Date: 01/07/2022 CLINICAL DATA:  Follow-up examination for intracranial hemorrhage. EXAM: CT ANGIOGRAPHY HEAD AND NECK TECHNIQUE: Multidetector CT imaging of the head and neck was performed using the standard protocol during bolus administration of intravenous contrast. Multiplanar CT image reconstructions and MIPs were obtained to evaluate the vascular anatomy. Carotid stenosis measurements (when applicable) are obtained utilizing NASCET  criteria, using the distal internal carotid diameter as the denominator. RADIATION DOSE REDUCTION: This exam was performed according to the departmental dose-optimization program which includes automated exposure control, adjustment of the mA and/or kV according to patient size and/or use of iterative reconstruction technique. CONTRAST:  65m OMNIPAQUE IOHEXOL 350 MG/ML SOLN COMPARISON:  Prior CT from earlier the same day. FINDINGS: CT HEAD FINDINGS Brain: Again seen is a large intraparenchymal hemorrhage centered at the left parieto-occipital region. This measures 5.9 x 5.0 x 4.3 cm in greatest dimensions (estimated volume 63 mL, previously 64 mL when measured in a similar fashion). Surrounding vasogenic edema and regional mass effect with trace left-to-right shift, similar to prior. Possible trace extra-axial extension of hemorrhage along the posterior falx noted, also stable. No visible intraventricular extension. No new intracranial hemorrhage since prior. Parenchymal hypodensity involving the right temporal lobe again noted, nonspecific, but stable from prior. This could potentially reflect a more subacute evolving cortical contusion at this location. No other acute large vessel territory infarct. No definite mass lesion. Ventricular size and morphology is relatively stable without hydrocephalus or trapping. Underlying atrophy with chronic small vessel ischemic disease again noted. Vascular: No hyperdense vessel. Calcified atherosclerosis present about the skull base. Skull: Scalp soft tissues demonstrate no acute finding. Calvarium intact. Sinuses: Paranasal sinuses remain largely clear. Trace left mastoid effusion noted. Orbits: Globes orbital soft tissues demonstrate no acute finding. Review of the MIP images confirms the above findings CTA NECK FINDINGS Aortic arch: Visualized aortic arch normal caliber with standard 3 vessel morphology. Mild for age atheromatous change about the arch itself. No stenosis  about the origin the great vessels. Right carotid system: Right common and internal carotid arteries patent without stenosis or dissection. Left carotid system: Left common and internal carotid arteries patent without stenosis or dissection. Vertebral arteries: Both vertebral arteries arise from the subclavian arteries. No proximal subclavian artery stenosis. Vertebral arteries patent without significant stenosis or dissection. Skeleton: Heterogeneous appearance of the clivus and left occipital condyle, of uncertain etiology or significance, but stable from prior neck CT from 01/16/2021, suggesting a benign etiology. No other discrete or worrisome osseous lesions. 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 Additional moderate spondylosis noted at C4-5 through C6-7. Patient is edentulous. Other neck: No other acute soft tissue abnormality within the neck. 1 cm right thyroid nodule noted, of doubtful significance given size and patient age, no follow-up imaging recommended (ref: J Am Coll Radiol. 2015 Feb;12(2): 143-50). Upper chest: 11 mm subpleural nodule present at the peripheral left upper lobe (series 10, image 10). An additional smaller 5 mm nodule noted within the adjacent left upper lobe as well (series 10, image 13). Visualized upper chest demonstrates no other acute  finding. Review of the MIP images confirms the above findings CTA HEAD FINDINGS Anterior circulation: Petrous segments patent bilaterally. Mild for age atheromatous change within the carotid siphons without significant stenosis. A1 segments patent. Normal anterior communicating artery complex. Anterior cerebral arteries patent without stenosis. No M1 stenosis or occlusion. No proximal MCA branch occlusion. Distal MCA branches well perfused bilaterally. Asymmetric prominence of the vascularity within the left cerebral hemisphere felt to be reactive in nature due to the adjacent bleed and/or edema. Posterior circulation:  Both vertebral arteries patent to the vertebrobasilar junction without significant stenosis. Left vertebral artery slightly dominant. Both PICA patent. Basilar patent to its distal aspect without stenosis. Superior cerebellar arteries patent bilaterally. Both PCAs primarily supplied via the basilar. Right PCA widely patent to its distal aspect. Left PCA is patent proximally, not well seen distally, likely due to edema from the adjacent hemorrhage in this region. Venous sinuses: Grossly patent allowing for timing the contrast bolus. The mid and anterior aspect of the superior sagittal sinus is unopacified on this exam. Anatomic variants: None significant. No vascular malformation seen underlying the acute cerebral hemorrhage. No intracranial aneurysm. Review of the MIP images confirms the above findings IMPRESSION: CT HEAD IMPRESSION: 1. No significant interval change in size and morphology of the acute intraparenchymal hemorrhage positioned at the left parieto-occipital region, estimated volume 63 mL. Similar surrounding edema with mild left-to-right shift. 2. Similar appearance of parenchymal hypodensity involving the right temporal lobe. Finding remains indeterminate, but could potentially reflect edema related to an evolving subacute cortical contusion at this location. Possible underlying mass not excluded. Attention at follow-up MRI recommended. 3. No other new acute intracranial abnormality. CTA HEAD AND NECK IMPRESSION: 1. Negative CTA for large vessel occlusion or other emergent finding. No vascular malformation seen underlying the acute left cerebral hemorrhage. 2. Mild for age atheromatous disease. No hemodynamically significant or correctable stenosis about the major arterial vasculature of the head and neck. 3. Few left upper lobe pulmonary nodules as above, largest of which measures 11 mm. These are incompletely assessed on this exam. Per Fleischner Society Guidelines, recommend prompt non-contrast  Chest CT for further evaluation. These guidelines do not apply to immunocompromised patients and patients with cancer. Follow up in patients with significant comorbidities as clinically warranted. For lung cancer screening, adhere to Lung-RADS guidelines. Reference: Radiology. 2017; 284(1):228-43. Electronically Signed   By: Jeannine Boga M.D.   On: 01/07/2022 04:27   DG Pelvis Portable  Result Date: 01/06/2022 CLINICAL DATA:  Intracerebral hemorrhage EXAM: PORTABLE PELVIS 1-2 VIEWS COMPARISON:  None Available. FINDINGS: Plate and screw fixation device across the pubic symphysis. Screw across the left SI joint. Hip joints are symmetric and unremarkable. No acute bony abnormality. Specifically, no fracture, subluxation, or dislocation. IMPRESSION: No acute bony abnormality. Electronically Signed   By: Rolm Baptise M.D.   On: 01/06/2022 21:05   DG Abd 1 View  Result Date: 01/06/2022 CLINICAL DATA:  Intracerebral hemorrhage EXAM: ABDOMEN - 1 VIEW COMPARISON:  None Available. FINDINGS: Nonobstructive bowel gas pattern. No organomegaly or free air. Calcified gallstones noted in the right upper quadrant. IMPRESSION: No acute findings. Cholelithiasis. Electronically Signed   By: Rolm Baptise M.D.   On: 01/06/2022 21:04   DG CHEST PORT 1 VIEW  Result Date: 01/06/2022 CLINICAL DATA:  Intracerebral hemorrhage EXAM: PORTABLE CHEST 1 VIEW COMPARISON:  03/08/2016 FINDINGS: Moderate-sized hiatal hernia. Heart is normal size. Scarring in the left lung base. Rounded density in the left mid lung measuring 3 cm. Old  left rib fractures. No acute bony abnormality. IMPRESSION: 3 cm rounded nodular density in the left mid lung concerning for pulmonary nodule/mass. This could be further evaluated with chest CT. Electronically Signed   By: Rolm Baptise M.D.   On: 01/06/2022 21:04   CT HEAD CODE STROKE WO CONTRAST  Result Date: 01/06/2022 CLINICAL DATA:  Code stroke. Neuro deficit, acute, stroke suspected. EXAM: CT  HEAD WITHOUT CONTRAST TECHNIQUE: Contiguous axial images were obtained from the base of the skull through the vertex without intravenous contrast. RADIATION DOSE REDUCTION: This exam was performed according to the departmental dose-optimization program which includes automated exposure control, adjustment of the mA and/or kV according to patient size and/or use of iterative reconstruction technique. COMPARISON:  No pertinent prior exams available for comparison. FINDINGS: Brain: Mild-to-moderate cerebral atrophy. Mild cerebellar atrophy. Large heterogeneous parenchymal hemorrhage centered within the left parietal lobe, measuring 5.0 x 4.6 x 5.6 cm. Moderate surrounding edema. Associated mass effect with partial effacement of the posterior left lateral ventricle. 2 mm rightward midline shift. 5.0 X 1.5 cm focus of abnormal hypodensity within the right temporal lobe periventricular white matter, indeterminate in etiology (for instance as seen on series 3, images 9-11). Cavum septum pellucidum and cavum vergae Vascular: No hyperdense vessel. Atherosclerotic calcifications. Skull: No fracture or aggressive osseous lesion. Sinuses/Orbits: No mass or acute finding within the imaged orbits. No significant paranasal sinus disease at the imaged levels. These results were called by telephone at the time of interpretation on 01/06/2022 at 11:00 am to provider [facility] , who verbally acknowledged these results. IMPRESSION: Large acute parenchymal hemorrhage centered within the left parietal lobe, measuring 5.0 x 5.6 x 4.6 cm. Given the heterogeneous appearance of this hemorrhage, a brain MRI without and with contrast is recommended to assess for an underlying mass. Moderate surrounding edema. Mass effect with partial effacement of the posterior left lateral ventricle and 2 mm rightward midline shift. 5.0 x 1.5 cm focus of abnormal hypodensity within the right temporal lobe periventricular white matter, indeterminate in  etiology. Attention to this site is recommended at time of MRI follow-up. Mild-to-moderate cerebral atrophy. Comparatively mild cerebellar atrophy. Electronically Signed   By: Kellie Simmering D.O.   On: 01/06/2022 11:12    Procedures Procedures    Medications Ordered in ED Medications  nicardipine (CARDENE) '20mg'$  in 0.86% saline 259m IV infusion (0.1 mg/ml) (has no administration in time range)  acetaminophen (TYLENOL) tablet 650 mg ( Oral See Alternative 01/08/22 0620)    Or  acetaminophen (TYLENOL) 160 MG/5ML solution 650 mg ( Per Tube See Alternative 01/08/22 0620)    Or  acetaminophen (TYLENOL) suppository 650 mg (650 mg Rectal Given 01/08/22 0620)  senna-docusate (Senokot-S) tablet 1 tablet (1 tablet Oral Given 01/08/22 0946)  pantoprazole (PROTONIX) injection 40 mg (40 mg Intravenous Given 01/07/22 2101)  labetalol (NORMODYNE) injection 20 mg (0 mg Intravenous Hold 01/06/22 1533)    And  clevidipine (CLEVIPREX) infusion 0.5 mg/mL (0 mg/hr Intravenous Hold 01/06/22 1534)  LORazepam (ATIVAN) injection 1 mg (has no administration in time range)  0.9 %  sodium chloride infusion ( Intravenous Rate/Dose Change 01/08/22 1027)  Chlorhexidine Gluconate Cloth 2 % PADS 6 each (6 each Topical Given 01/08/22 0947)  insulin aspart (novoLOG) injection 0-9 Units (1 Units Subcutaneous Given 01/08/22 0822)  heparin injection 5,000 Units (5,000 Units Subcutaneous Given 01/08/22 0615)  losartan (COZAAR) tablet 12.5 mg (12.5 mg Oral Given 01/08/22 0946)  vitamin B-12 (CYANOCOBALAMIN) tablet 1,000 mcg (1,000 mcg Oral Given 01/08/22  [phone])  melatonin tablet 3 mg (has no administration in time range)  QUEtiapine (SEROQUEL) tablet 12.5 mg (has no administration in time range)  diclofenac Sodium (VOLTAREN) 1 % topical gel 2 g (has no administration in time range)  coag fact Xa recombinant (ANDEXXA) low dose infusion 900 mg (0 mg Intravenous Stopped 01/06/22 1348)  acetaminophen (TYLENOL) suppository 650 mg (650 mg Rectal  Given 01/06/22 1139)   stroke: early stages of recovery book ( Does not apply Given 01/07/22 1201)  iohexol (OMNIPAQUE) 350 MG/ML injection 65 mL (65 mLs Intravenous Contrast Given 01/06/22 1951)  LORazepam (ATIVAN) injection 0.5 mg (0.5 mg Intravenous Given 01/07/22 0440)  gadobutrol (GADAVIST) 1 MMOL/ML injection 9 mL (9 mLs Intravenous Contrast Given 01/07/22 1219)    ED Course/ Medical Decision Making/ A&P                           Medical Decision Making Amount and/or Complexity of Data Reviewed Labs: ordered. Radiology: ordered.  Risk OTC drugs. Prescription drug management. Decision regarding hospitalization.   This patient presents to the ED for concern of altered mental status, this involves an extensive number of treatment options, and is a complaint that carries with it a high risk of complications and morbidity.  The differential diagnosis includes CVA, ICH, hypoglycemia, polypharmacy, TBI, sepsis   Co morbidities that complicate the patient evaluation  DM, HTN, GERD, atrial fibrillation, CHF   Additional history obtained:  Additional history obtained from patient's family External records from outside source obtained and reviewed including EMR   Lab Tests:  I Ordered, and personally interpreted labs.  The pertinent results include: Based CKD, normal electrolytes, baseline anemia, normal platelets, INR increased to 1.5.    Imaging Studies ordered:  I ordered imaging studies including CT head I independently visualized and interpreted imaging which showed large acute parenchymal hemorrhage centered in left parietal lobe.  There is a concern of associated underlying mass.  There is some mass effect with partial effacement of posterior left lateral ventricle and 2 mm of midline shift.  There is an additional finding of abnormal hypodensity in right temporal lobe. I agree with the radiologist interpretation   Cardiac Monitoring: / EKG:  The patient was maintained  on a cardiac monitor.  I personally viewed and interpreted the cardiac monitored which showed an underlying rhythm of: Atrial fibrillation   Consultations Obtained:  I requested consultation with the neurologist, Dr. Erlinda Hong,  and discussed lab and imaging findings as well as pertinent plan - they recommend: Agree with Andexxa.  SBP goal less than 150.  Transfer to [employer] [facility].   Problem List / ED Course / Critical interventions / Medication management  Patient is 81 year old male who lives independently who presents via EMS after being found by family on the floor in his home confused.  Per family, he was in his normal state of health yesterday.  Last known normal was 9 PM when they had spoken with him on the phone.  He did not endorse any physical complaints yesterday.  Today, he reportedly called his daughter but was unable to verbalize on the phone.  Patient's other daughter went to check on him and he was on the floor confused.  On arrival in the ED, history is limited by patient's dysarthria.  He does have right-sided hemineglect as well as what appears to be complete loss of strength and sensation on his right side.  Findings are concerning for CVA.  Code stroke was initiated.  On noncontrasted CT of his head, patient is found to have an acute parenchymal hemorrhage in right parietal lobe with some mild mass effect.  Patient is on Eliquis for atrial fibrillation.  Per his family, he does take this twice a day.  Andexxa was ordered for reversal of anticoagulation.  Fortunately, patient is maintaining his airway.  He did have some slight improvement while in the ED and was able to endorse a headache.  Tylenol suppository was ordered.  Neurologist on-call, Dr. Erlinda Hong agrees with anticoagulation reversal and does request transfer to [facility].  On the CT scan, there was a concern of possible associated neoplasm in the area of bleeding.  An MRI was recommended for further evaluation.  Dr. Erlinda Hong advises that  this can be obtained at a later time.  Patient maintained a normal blood pressure and did not require antihypertensive medications while in the ED.  He was transferred to [facility] in stable condition. I ordered medication including Andexxa for reversal of Eliquis; Tylenol for headache Reevaluation of the patient after these medicines showed that the patient improved I have reviewed the patients home medicines and have made adjustments as needed   Social Determinants of Health:  Lives independently  CRITICAL CARE Performed by: Godfrey Pick   Total critical care time: 40 minutes  Critical care time was exclusive of separately billable procedures and treating other patients.  Critical care was necessary to treat or prevent imminent or life-threatening deterioration.  Critical care was time spent personally by me on the following activities: development of treatment plan with patient and/or surrogate as well as nursing, discussions with consultants, evaluation of patient's response to treatment, examination of patient, obtaining history from patient or surrogate, ordering and performing treatments and interventions, ordering and review of laboratory studies, ordering and review of radiographic studies, pulse oximetry and re-evaluation of patient's condition.         Final Clinical Impression(s) / ED Diagnoses Final diagnoses:  Intraparenchymal hemorrhage of brain [facility])    Rx / DC Orders ED Discharge Orders     None             Godfrey Pick, MD 01/08/22 1045

## 2022-01-06 NOTE — Progress Notes (Signed)
Dr Erlinda Hong and Dr Rory Percy paged to let them know patient arrived to 4N ICU RM 30.  Montez Hageman RN
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
## 2022-01-06 NOTE — Consult Note (Addendum)
[facility] Telemedicine Consult   Requesting Provider: Dr. Doren Custard  Chief Complaint: right sided numbness, weakness, found down at home  HPI:  81 year old male with history of hypertension, A-fib on Eliquis, CHF, CKD presented to ED for code stroke.  Per EMS, patient last seen normal at 9 PM last night by family.  This morning her stepdaughter went to his house and found him down on the floor, complaining of right-sided numbness and tingling.  Per sister, patient told her that insects crawling on his face and he cannot get them off.  EMS was called and patient was found to have left gaze preference, right-sided numbness and weakness.  He was sent to ER for evaluation.  BP 113/74 and glucose 156.  Patient has history of A-fib on Eliquis, sister checked pillbox and confirmed that he took Eliquis last night.  Patient also on Lipitor at home.  LKW: 9 PM last night tpa given?: No, ICH  Exam: Vitals:   01/06/22 1042 01/06/22 1100  BP: 103/80 136/64  Pulse: (!) 57 (!) 57  Resp: 14 20  SpO2: 98% 100%     Pulse Rate:  [57] 57 (07/12 1100) Resp:  [14-20] 20 (07/12 1100) BP: (103-136)/(64-80) 136/64 (07/12 1100) SpO2:  [98 %-100 %] 100 % (07/12 1100)  General - Well nourished, well developed, in no apparent distress.  Ophthalmologic - fundi not visualized due to noncooperation.  Cardiovascular - irregularly irregular heart rate and rhythm, mild bradycardia.  Neuro - awake, alert, eyes open, not orientated to place, time or age but able to tell me his DOB. No aphasia, but paucity of speech, following most simple commands although hard of hearing. Able to name 2/3 and repeat simple sentence.  Left gaze preference, able to cross midline to right but incomplete, tracking from the left, not blinking to visual threat bilaterally, however, more consistent with right HH or neglect. No facial droop. Tongue protrusion not corporative. LUE no drift, RUE fall to bed before 10 seconds but  does have some antigravity effort. LLE 3/5, RLE 2/5. Sensation symmetrical on the face subjectively, however sensation loss on the right arm and leg, left finger-to-nose ataxic due to weakness, right finger-to-nose grossly intact.  Gait not tested.     NIH Stroke Scale  Level Of Consciousness 0=Alert; keenly responsive 1=Arouse to minor stimulation 2=Requires repeated stimulation to arouse or movements to pain 3=postures or unresponsive 0  LOC Questions to Month and Age 72=Answers both questions correctly 1=Answers one question correctly or dysarthria/intubated/trauma/language barrier 2=Answers neither question correctly or aphasia 2  LOC Commands      -Open/Close eyes     -Open/close grip     -Pantomime commands if communication barrier 0=Performs both tasks correctly 1=Performs one task correctly 2=Performs neighter task correctly 0  Best Gaze     -Only assess horizontal gaze 0=Normal 1=Partial gaze palsy 2=Forced deviation, or total gaze paresis 1  Visual 0=No visual loss 1=Partial hemianopia 2=Complete hemianopia 3=Bilateral hemianopia (blind including cortical blindness) 2  Facial Palsy     -Use grimace if obtunded 0=Normal symmetrical movement 1=Minor paralysis (asymmetry) 2=Partial paralysis (lower face) 3=Complete paralysis (upper and lower face) 0  Motor  0=No drift for 10/5 seconds 1=Drift, but does not hit bed 2=Some antigravity effort, hits  bed 3=No effort against gravity, limb falls 4=No movement 0=Amputation/joint fusion Right Arm 2     Leg 3    Left Arm 0     Leg 1  Limb Ataxia     -  FNT/HTS 0=Absent or does not understand or paralyzed or amputation/joint fusion 1=Present in one limb 2=Present in two limbs 1  Sensory 0=Normal 1=Mild to moderate sensory loss 2=Severe to total sensory loss or coma/unresponsive 2  Best Language 0=No aphasia, normal 1=Mild to moderate aphasia 2=Severe aphasia 3=Mute, global aphasia, or coma/unresponsive 1  Dysarthria  0=Normal 1=Mild to moderate 2=Severe, unintelligible or mute/anarthric 0=intubated/unable to test 1  Extinction/Neglect 0=No abnormality 1=visual/tactile/auditory/spatia/personal inattention/Extinction to bilateral simultaneous stimulation 2=Profound neglect/extinction more than 1 modality  2  Total   18      Imaging Reviewed:  CT HEAD CODE STROKE WO CONTRAST  Result Date: 01/06/2022 CLINICAL DATA:  Code stroke. Neuro deficit, acute, stroke suspected. EXAM: CT HEAD WITHOUT CONTRAST TECHNIQUE: Contiguous axial images were obtained from the base of the skull through the vertex without intravenous contrast. RADIATION DOSE REDUCTION: This exam was performed according to the departmental dose-optimization program which includes automated exposure control, adjustment of the mA and/or kV according to patient size and/or use of iterative reconstruction technique. COMPARISON:  No pertinent prior exams available for comparison. FINDINGS: Brain: Mild-to-moderate cerebral atrophy. Mild cerebellar atrophy. Large heterogeneous parenchymal hemorrhage centered within the left parietal lobe, measuring 5.0 x 4.6 x 5.6 cm. Moderate surrounding edema. Associated mass effect with partial effacement of the posterior left lateral ventricle. 2 mm rightward midline shift. 5.0 X 1.5 cm focus of abnormal hypodensity within the right temporal lobe periventricular white matter, indeterminate in etiology (for instance as seen on series 3, images 9-11). Cavum septum pellucidum and cavum vergae Vascular: No hyperdense vessel. Atherosclerotic calcifications. Skull: No fracture or aggressive osseous lesion. Sinuses/Orbits: No mass or acute finding within the imaged orbits. No significant paranasal sinus disease at the imaged levels. These results were called by telephone at the time of interpretation on 01/06/2022 at 11:00 am to provider [facility] , who verbally acknowledged these results. IMPRESSION: Large acute parenchymal  hemorrhage centered within the left parietal lobe, measuring 5.0 x 5.6 x 4.6 cm. Given the heterogeneous appearance of this hemorrhage, a brain MRI without and with contrast is recommended to assess for an underlying mass. Moderate surrounding edema. Mass effect with partial effacement of the posterior left lateral ventricle and 2 mm rightward midline shift. 5.0 x 1.5 cm focus of abnormal hypodensity within the right temporal lobe periventricular white matter, indeterminate in etiology. Attention to this site is recommended at time of MRI follow-up. Mild-to-moderate cerebral atrophy. Comparatively mild cerebellar atrophy. Electronically Signed   By: Kellie Simmering D.O.   On: 01/06/2022 11:12     Labs reviewed in epic and pertinent values follow: Creatinine 1.51, WBC 11.8, platelet 274, INR 1.5  Assessment:  with history of hypertension, A-fib on Eliquis, CHF, CKD presented to ED for found down at home, complaining of right-sided numbness and tingling.  LSW 9pm last night. Exam showed left gaze preference, right-sided numbness and weakness. NIHSS = 18. CT head showed left parietooccipital ICH. BP stable now at goal. Patient has history of A-fib on Eliquis, and he took Eliquis last night. Will need Andexxa reversal and BP goal 130-150. Recommend to transfer to [facility] for further management. Repeat CT in 6 hours.   Recommendations:  Recommend transfer to [facility] for further management Stat CT head if acute neuro changes Blood pressure management - Target SBP <130-150. Consider IV BP meds PRN for BP control Andexxa reversal of Eliquis Head of bed elevation > 30 degrees if not contraindicated If seizure, treat with antiepileptic medication Repeat CT head without  contrast in 6 hours to document stability of hemorrhage Once at [facility], will do CTA head to rule out underlying vascular lesion and MRI with and without contrast to rule out other etiology of ICH Discussed with Dr. Doren Custard ED physician We will follow while  pt at [facility], will manage once pt at [facility]   Consult Participants: Stroke response RN, bedside RN, patient, patient's sister, me Location of the provider: [facility] Location of the patient: [facility]  Time Code Stroke Page received: 10:41 AM Time neurologist arrived: 10:45 AM Time NIHSS completed: 11:01 AM  This consult was provided via telemedicine with 2-way video and audio communication. The patient/family was informed that care would be provided in this way and agreed to receive care in this manner.   This patient is receiving care for possible acute neurological changes. There was 65 minutes of care by this provider at the time of service, including time for direct evaluation via telemedicine, review of medical records, imaging studies and discussion of findings with providers, the patient and/or family.  Rosalin Hawking, MD PhD Stroke Neurology 01/06/2022 11:18 AM

## 2022-01-07 ENCOUNTER — Inpatient Hospital Stay (HOSPITAL_COMMUNITY): Payer: Medicare Other

## 2022-01-07 DIAGNOSIS — I611 Nontraumatic intracerebral hemorrhage in hemisphere, cortical: Secondary | ICD-10-CM | POA: Diagnosis not present

## 2022-01-07 DIAGNOSIS — R569 Unspecified convulsions: Secondary | ICD-10-CM

## 2022-01-07 DIAGNOSIS — I10 Essential (primary) hypertension: Secondary | ICD-10-CM | POA: Diagnosis not present

## 2022-01-07 DIAGNOSIS — G936 Cerebral edema: Secondary | ICD-10-CM | POA: Diagnosis not present

## 2022-01-07 DIAGNOSIS — I482 Chronic atrial fibrillation, unspecified: Secondary | ICD-10-CM

## 2022-01-07 DIAGNOSIS — E0829 Diabetes mellitus due to underlying condition with other diabetic kidney complication: Secondary | ICD-10-CM | POA: Diagnosis not present

## 2022-01-07 DIAGNOSIS — I6389 Other cerebral infarction: Secondary | ICD-10-CM | POA: Diagnosis not present

## 2022-01-07 DIAGNOSIS — G9389 Other specified disorders of brain: Secondary | ICD-10-CM

## 2022-01-07 LAB — ECHOCARDIOGRAM COMPLETE
AR max vel: 2.58 cm2
AV Peak grad: 10.5 mmHg
Ao pk vel: 1.62 m/s
Area-P 1/2: 4.68 cm2
Calc EF: 56.1 %
S' Lateral: 3.6 cm
Single Plane A2C EF: 57.8 %
Single Plane A4C EF: 56.1 %

## 2022-01-07 LAB — BASIC METABOLIC PANEL
Anion gap: 6 (ref 5–15)
BUN: 12 mg/dL (ref 8–23)
CO2: 20 mmol/L — ABNORMAL LOW (ref 22–32)
Calcium: 8.3 mg/dL — ABNORMAL LOW (ref 8.9–10.3)
Chloride: 114 mmol/L — ABNORMAL HIGH (ref 98–111)
Creatinine, Ser: 1.27 mg/dL — ABNORMAL HIGH (ref 0.61–1.24)
GFR, Estimated: 56 mL/min — ABNORMAL LOW (ref >60)
Glucose, Bld: 155 mg/dL — ABNORMAL HIGH (ref 70–99)
Potassium: 3.8 mmol/L (ref 3.5–5.1)
Sodium: 140 mmol/L (ref 135–145)

## 2022-01-07 LAB — GLUCOSE, CAPILLARY
Glucose-Capillary: 143 mg/dL — ABNORMAL HIGH (ref 70–99)
Glucose-Capillary: 157 mg/dL — ABNORMAL HIGH (ref 70–99)
Glucose-Capillary: 159 mg/dL — ABNORMAL HIGH (ref 70–99)
Glucose-Capillary: 192 mg/dL — ABNORMAL HIGH (ref 70–99)
Glucose-Capillary: 162 mg/dL — ABNORMAL HIGH (ref 70–99)

## 2022-01-07 LAB — CBC
HCT: 32.4 % — ABNORMAL LOW (ref 39.0–52.0)
Hemoglobin: 10.7 g/dL — ABNORMAL LOW (ref 13.0–17.0)
MCH: 31.4 pg (ref 26.0–34.0)
MCHC: 33 g/dL (ref 30.0–36.0)
MCV: 95 fL (ref 80.0–100.0)
Platelets: 268 10*3/uL (ref 150–400)
RBC: 3.41 MIL/uL — ABNORMAL LOW (ref 4.22–5.81)
RDW: 15.4 % (ref 11.5–15.5)
WBC: 16 10*3/uL — ABNORMAL HIGH (ref 4.0–10.5)
nRBC: 0 % (ref 0.0–0.2)

## 2022-01-07 LAB — DIGOXIN LEVEL: Digoxin Level: 1.1 ng/mL (ref 0.8–2.0)

## 2022-01-07 MED ORDER — CHLORHEXIDINE GLUCONATE CLOTH 2 % EX PADS
6.0000 | MEDICATED_PAD | Freq: Every day | CUTANEOUS | Status: DC
Start: 2022-01-07 — End: 2022-01-12
  Administered 2022-01-07 – 2022-01-12 (×6): 6 via TOPICAL

## 2022-01-07 MED ORDER — GADOBUTROL 1 MMOL/ML IV SOLN
9.0000 mL | Freq: Once | INTRAVENOUS | Status: AC | PRN
  Administered 2022-01-07: 9 mL via INTRAVENOUS

## 2022-01-07 MED ORDER — HEPARIN SODIUM (PORCINE) 5000 UNIT/ML IJ SOLN
5000.0000 [IU] | Freq: Three times a day (TID) | INTRAMUSCULAR | Status: DC
  Administered 2022-01-07 – 2022-01-12 (×14): 5000 [IU] via SUBCUTANEOUS
  Filled 2022-01-07 (×13): qty 1

## 2022-01-07 MED ORDER — LOSARTAN POTASSIUM 25 MG PO TABS
12.5000 mg | ORAL_TABLET | Freq: Every day | ORAL | Status: DC
  Administered 2022-01-07 – 2022-01-12 (×6): 12.5 mg via ORAL
  Filled 2022-01-07: qty 0.5
  Filled 2022-01-07 (×3): qty 1
  Filled 2022-01-07: qty 0.5
  Filled 2022-01-07: qty 1

## 2022-01-07 MED ORDER — LORAZEPAM 2 MG/ML IJ SOLN
0.5000 mg | Freq: Once | INTRAMUSCULAR | Status: AC
  Administered 2022-01-07: 0.5 mg via INTRAVENOUS
  Filled 2022-01-07: qty 1

## 2022-01-07 MED ORDER — VITAMIN B-12 1000 MCG PO TABS
1000.0000 ug | ORAL_TABLET | Freq: Every day | ORAL | Status: DC
  Administered 2022-01-07 – 2022-01-12 (×6): 1000 ug via ORAL
  Filled 2022-01-07 (×6): qty 1

## 2022-01-07 MED ORDER — INSULIN ASPART 100 UNIT/ML IJ SOLN
0.0000 [IU] | INTRAMUSCULAR | Status: DC
  Administered 2022-01-07: 2 [IU] via SUBCUTANEOUS
  Administered 2022-01-07: 1 [IU] via SUBCUTANEOUS
  Administered 2022-01-07 – 2022-01-08 (×4): 2 [IU] via SUBCUTANEOUS
  Administered 2022-01-08 (×2): 1 [IU] via SUBCUTANEOUS

## 2022-01-07 NOTE — Progress Notes (Signed)
CODE STROKE [place]
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
## 2022-01-07 NOTE — Progress Notes (Signed)
STROKE TEAM PROGRESS NOTE   INTERVAL HISTORY His sisters and grandchildren are at the bedside.  Pt came back from MRI, lying in bed, hard of hearing. Still has left gaze preference, and right neglect, right hemiplegia. BP stable. MRI showed stable hematoma. Right temporal lesion not well characterized due to motion.    OBJECTIVE Vitals:   01/07/22 0100 01/07/22 0200 01/07/22 0300 01/07/22 0400  BP: (!) 149/68 (!) 141/71 140/62 139/66  Pulse: 62 78 76 63  Resp: (!) 21 (!) 22 (!) 22 15  Temp:    99.7 F (37.6 C)  TempSrc:    Oral  SpO2: 100% 98% 100% 100%    CBC:  Recent Labs  Lab 01/06/22 1035 01/06/22 1114  WBC 11.8*  --   NEUTROABS 7.6  --   HGB 11.1* 12.2*  HCT 35.0* 36.0*  MCV 98.9  --   PLT 274  --     Basic Metabolic Panel:  Recent Labs  Lab 01/06/22 1035 01/06/22 1114  NA 138 142  K 4.3 4.4  CL 113* 109  CO2 21*  --   GLUCOSE 182* 180*  BUN 19 19  CREATININE 1.51* 1.50*  CALCIUM 8.4*  --     Lipid Panel:     Component Value Date/Time   CHOL 79 01/06/2022 1618   TRIG 111 01/06/2022 1618   HDL 24 (L) 01/06/2022 1618   CHOLHDL 3.3 01/06/2022 1618   VLDL 22 01/06/2022 1618   LDLCALC 33 01/06/2022 1618   HgbA1c:  Lab Results  Component Value Date   HGBA1C 8.3 (H) 01/06/2022   Urine Drug Screen:     Component Value Date/Time   LABOPIA NONE DETECTED 01/06/2022 1803   COCAINSCRNUR NONE DETECTED 01/06/2022 1803   LABBENZ NONE DETECTED 01/06/2022 1803   AMPHETMU NONE DETECTED 01/06/2022 1803   THCU NONE DETECTED 01/06/2022 1803   LABBARB NONE DETECTED 01/06/2022 1803    Alcohol Level     Component Value Date/Time   ETH <10 01/06/2022 1035    IMAGING   CT ANGIO HEAD NECK W WO CM  Result Date: 01/07/2022 CLINICAL DATA:  Follow-up examination for intracranial hemorrhage. EXAM: CT ANGIOGRAPHY HEAD AND NECK TECHNIQUE: Multidetector CT imaging of the head and neck was performed using the standard protocol during bolus administration of  intravenous contrast. Multiplanar CT image reconstructions and MIPs were obtained to evaluate the vascular anatomy. Carotid stenosis measurements (when applicable) are obtained utilizing NASCET criteria, using the distal internal carotid diameter as the denominator. RADIATION DOSE REDUCTION: This exam was performed according to the departmental dose-optimization program which includes automated exposure control, adjustment of the mA and/or kV according to patient size and/or use of iterative reconstruction technique. CONTRAST:  57m OMNIPAQUE IOHEXOL 350 MG/ML SOLN COMPARISON:  Prior CT from earlier the same day. FINDINGS: CT HEAD FINDINGS Brain: Again seen is a large intraparenchymal hemorrhage centered at the left parieto-occipital region. This measures 5.9 x 5.0 x 4.3 cm in greatest dimensions (estimated volume 63 mL, previously 64 mL when measured in a similar fashion). Surrounding vasogenic edema and regional mass effect with trace left-to-right shift, similar to prior. Possible trace extra-axial extension of hemorrhage along the posterior falx noted, also stable. No visible intraventricular extension. No new intracranial hemorrhage since prior. Parenchymal hypodensity involving the right temporal lobe again noted, nonspecific, but stable from prior. This could potentially reflect a more subacute evolving cortical contusion at this location. No other acute large vessel territory infarct. No definite mass lesion. Ventricular size  and morphology is relatively stable without hydrocephalus or trapping. Underlying atrophy with chronic small vessel ischemic disease again noted. Vascular: No hyperdense vessel. Calcified atherosclerosis present about the skull base. Skull: Scalp soft tissues demonstrate no acute finding. Calvarium intact. Sinuses: Paranasal sinuses remain largely clear. Trace left mastoid effusion noted. Orbits: Globes orbital soft tissues demonstrate no acute finding. Review of the MIP images confirms  the above findings CTA NECK FINDINGS Aortic arch: Visualized aortic arch normal caliber with standard 3 vessel morphology. Mild for age atheromatous change about the arch itself. No stenosis about the origin the great vessels. Right carotid system: Right common and internal carotid arteries patent without stenosis or dissection. Left carotid system: Left common and internal carotid arteries patent without stenosis or dissection. Vertebral arteries: Both vertebral arteries arise from the subclavian arteries. No proximal subclavian artery stenosis. Vertebral arteries patent without significant stenosis or dissection. Skeleton: Heterogeneous appearance of the clivus and left occipital condyle, of uncertain etiology or significance, but stable from prior neck CT from 01/16/2021, suggesting a benign etiology. No other discrete or worrisome osseous lesions. 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 Additional moderate spondylosis noted at C4-5 through C6-7. Patient is edentulous. Other neck: No other acute soft tissue abnormality within the neck. 1 cm right thyroid nodule noted, of doubtful significance given size and patient age, no follow-up imaging recommended (ref: J Am Coll Radiol. 2015 Feb;12(2): 143-50). Upper chest: 11 mm subpleural nodule present at the peripheral left upper lobe (series 10, image 10). An additional smaller 5 mm nodule noted within the adjacent left upper lobe as well (series 10, image 13). Visualized upper chest demonstrates no other acute finding. Review of the MIP images confirms the above findings CTA HEAD FINDINGS Anterior circulation: Petrous segments patent bilaterally. Mild for age atheromatous change within the carotid siphons without significant stenosis. A1 segments patent. Normal anterior communicating artery complex. Anterior cerebral arteries patent without stenosis. No M1 stenosis or occlusion. No proximal MCA branch occlusion. Distal MCA branches well  perfused bilaterally. Asymmetric prominence of the vascularity within the left cerebral hemisphere felt to be reactive in nature due to the adjacent bleed and/or edema. Posterior circulation: Both vertebral arteries patent to the vertebrobasilar junction without significant stenosis. Left vertebral artery slightly dominant. Both PICA patent. Basilar patent to its distal aspect without stenosis. Superior cerebellar arteries patent bilaterally. Both PCAs primarily supplied via the basilar. Right PCA widely patent to its distal aspect. Left PCA is patent proximally, not well seen distally, likely due to edema from the adjacent hemorrhage in this region. Venous sinuses: Grossly patent allowing for timing the contrast bolus. The mid and anterior aspect of the superior sagittal sinus is unopacified on this exam. Anatomic variants: None significant. No vascular malformation seen underlying the acute cerebral hemorrhage. No intracranial aneurysm. Review of the MIP images confirms the above findings IMPRESSION: CT HEAD IMPRESSION: 1. No significant interval change in size and morphology of the acute intraparenchymal hemorrhage positioned at the left parieto-occipital region, estimated volume 63 mL. Similar surrounding edema with mild left-to-right shift. 2. Similar appearance of parenchymal hypodensity involving the right temporal lobe. Finding remains indeterminate, but could potentially reflect edema related to an evolving subacute cortical contusion at this location. Possible underlying mass not excluded. Attention at follow-up MRI recommended. 3. No other new acute intracranial abnormality. CTA HEAD AND NECK IMPRESSION: 1. Negative CTA for large vessel occlusion or other emergent finding. No vascular malformation seen underlying the acute left cerebral hemorrhage. 2. Mild for  age atheromatous disease. No hemodynamically significant or correctable stenosis about the major arterial vasculature of the head and neck. 3. Few  left upper lobe pulmonary nodules as above, largest of which measures 11 mm. These are incompletely assessed on this exam. Per Fleischner Society Guidelines, recommend prompt non-contrast Chest CT for further evaluation. These guidelines do not apply to immunocompromised patients and patients with cancer. Follow up in patients with significant comorbidities as clinically warranted. For lung cancer screening, adhere to Lung-RADS guidelines. Reference: Radiology. 2017; 284(1):228-43. Electronically Signed   By: Jeannine Boga M.D.   On: 01/07/2022 04:27   DG Pelvis Portable  Result Date: 01/06/2022 CLINICAL DATA:  Intracerebral hemorrhage EXAM: PORTABLE PELVIS 1-2 VIEWS COMPARISON:  None Available. FINDINGS: Plate and screw fixation device across the pubic symphysis. Screw across the left SI joint. Hip joints are symmetric and unremarkable. No acute bony abnormality. Specifically, no fracture, subluxation, or dislocation. IMPRESSION: No acute bony abnormality. Electronically Signed   By: Rolm Baptise M.D.   On: 01/06/2022 21:05   DG Abd 1 View  Result Date: 01/06/2022 CLINICAL DATA:  Intracerebral hemorrhage EXAM: ABDOMEN - 1 VIEW COMPARISON:  None Available. FINDINGS: Nonobstructive bowel gas pattern. No organomegaly or free air. Calcified gallstones noted in the right upper quadrant. IMPRESSION: No acute findings. Cholelithiasis. Electronically Signed   By: Rolm Baptise M.D.   On: 01/06/2022 21:04   DG CHEST PORT 1 VIEW  Result Date: 01/06/2022 CLINICAL DATA:  Intracerebral hemorrhage EXAM: PORTABLE CHEST 1 VIEW COMPARISON:  03/08/2016 FINDINGS: Moderate-sized hiatal hernia. Heart is normal size. Scarring in the left lung base. Rounded density in the left mid lung measuring 3 cm. Old left rib fractures. No acute bony abnormality. IMPRESSION: 3 cm rounded nodular density in the left mid lung concerning for pulmonary nodule/mass. This could be further evaluated with chest CT. Electronically Signed    By: Rolm Baptise M.D.   On: 01/06/2022 21:04   CT HEAD CODE STROKE WO CONTRAST  Result Date: 01/06/2022 CLINICAL DATA:  Code stroke. Neuro deficit, acute, stroke suspected. EXAM: CT HEAD WITHOUT CONTRAST TECHNIQUE: Contiguous axial images were obtained from the base of the skull through the vertex without intravenous contrast. RADIATION DOSE REDUCTION: This exam was performed according to the departmental dose-optimization program which includes automated exposure control, adjustment of the mA and/or kV according to patient size and/or use of iterative reconstruction technique. COMPARISON:  No pertinent prior exams available for comparison. FINDINGS: Brain: Mild-to-moderate cerebral atrophy. Mild cerebellar atrophy. Large heterogeneous parenchymal hemorrhage centered within the left parietal lobe, measuring 5.0 x 4.6 x 5.6 cm. Moderate surrounding edema. Associated mass effect with partial effacement of the posterior left lateral ventricle. 2 mm rightward midline shift. 5.0 X 1.5 cm focus of abnormal hypodensity within the right temporal lobe periventricular white matter, indeterminate in etiology (for instance as seen on series 3, images 9-11). Cavum septum pellucidum and cavum vergae Vascular: No hyperdense vessel. Atherosclerotic calcifications. Skull: No fracture or aggressive osseous lesion. Sinuses/Orbits: No mass or acute finding within the imaged orbits. No significant paranasal sinus disease at the imaged levels. These results were called by telephone at the time of interpretation on 01/06/2022 at 11:00 am to provider [facility] , who verbally acknowledged these results. IMPRESSION: Large acute parenchymal hemorrhage centered within the left parietal lobe, measuring 5.0 x 5.6 x 4.6 cm. Given the heterogeneous appearance of this hemorrhage, a brain MRI without and with contrast is recommended to assess for an underlying mass. Moderate surrounding edema. Mass  effect with partial effacement of the posterior  left lateral ventricle and 2 mm rightward midline shift. 5.0 x 1.5 cm focus of abnormal hypodensity within the right temporal lobe periventricular white matter, indeterminate in etiology. Attention to this site is recommended at time of MRI follow-up. Mild-to-moderate cerebral atrophy. Comparatively mild cerebellar atrophy. Electronically Signed   By: Kellie Simmering D.O.   On: 01/06/2022 11:12     Transthoracic Echocardiogram  00/00/2021 Pending  ECG - atrial fibrillation - ventricular response 59 BPM. (See cardiology reading for complete details)   PHYSICAL EXAM  Temp:  [98.3 F (36.8 C)-99.7 F (37.6 C)] 99.5 F (37.5 C) (07/13 0800) Pulse Rate:  [50-92] 76 (07/13 1400) Resp:  [15-26] 26 (07/13 1400) BP: (133-155)/(60-116) 149/74 (07/13 1400) SpO2:  [92 %-100 %] 97 % (07/13 1400) Weight:  [99.5 kg] 99.5 kg (07/13 0800)  General - Well nourished, well developed, in mild distress due to right LE spasm.  Ophthalmologic - fundi not visualized due to noncooperation.  Cardiovascular - irregularly irregular heart rate and rhythm.  Neuro - awake, alert, eyes open, hard of hearing, orientated to place and people, but not to time or age. Limited verbal outpt with severe dysarthria, able to have some words out but no full sentences, following most simple commands with repetitive prompt. Mild perseveration noted. Not to name and repeat. Left gaze preference but able to cross midline, baseline disconjugated eyes with left eye abduction position, not blinking to visual threat consistently bilaterally, b/l reactive to light but pupil left 3->62m, right 4->370m Slight right facial droop. Tongue protrusion not cooperative. LUE against gravity no drift. RUE flaccid. LLE 3/5 and RLE withdraw to pain with increased muscle tone. Sensation, coordination and gait not tested.   ASSESSMENT/PLAN Mr. JaJESTON JUNKINSs a [age].o. male with history of atrial fibrillation on Eliquis, hypertension, iron deficiency  anemia, B12 deficiency, hearing impaired, CHF (severe non ischemic cardiomyopathy), CKD, diabetes presenting with right-sided numbness weakness and left gaze preference after being found on floor. The pt is unable to provide a reliable history. Eliquis reversed with Andexxa.  He did not receive TNK due to ICFinger ICH - left parietal occipital ICH, could be related to Eliquis use vs hemorrhagic brain mass CT Head -  Large acute parenchymal hemorrhage centered within the left parietal lobe, measuring 5.0 x 5.6 x 4.6 cm. Mass effect with partial effacement of the posterior left lateral ventricle and 2 mm rightward midline shift.  CT head and neck unremarkable. CT head repeat no significant interval change in size and morphology of acute ICH.  Similar surrounding edema. MRI brain with and without contrast large left parietal intraparenchymal hematoma measuring up to 5.8 cm with local mass effect without evidence of hydrocephalus or entrapment. A 2.6 cm mass lesion is identified in the medial aspect of the intraparenchymal hemorrhage. Recommend repeat study when clinically appropriate. 2D Echo EF 60 to 65% LDL - 33 HgbA1c - 8.3 UDS - negative VTE prophylaxis - heparin subcu Eliquis (apixaban) daily prior to admission, now on No antithrombotic due to ICAhoskiengoing aggressive stroke risk factor management Therapy recommendations:  pending Disposition:  Pending  Right temporal brain lesion CT head 5.0 x 1.5 cm focus of abnormal hypodensity within the right temporal lobe periventricular white matter, indeterminate in etiology.  MRI with and without contrast T2 hyperintensity within the white matter of the right temporal lobe. Underlying lesion cannot be excluded given severe motion artifact.  Afib Cardiomyopathy On Eliquis PTA Status  post Andexxa reversal On home digoxin, Aldactone, Coreg and losartan PTA for cardiomyopathy  Hypertension Home BP meds: Coreg ; Losartan ; aldactone Stable On home  losartan SBP goal < 130-150 mm Hg  Long-term BP goal normotensive  Hyperlipidemia Home Lipid lowering medication: Lipitor 20 mg daily LDL 33, goal < 70 Will continue statin at discharge May not be SATURN candidate due to questionable tumor bleed  Diabetes Home diabetic meds: glucotrol ; metformin ; actos Current diabetic meds: none Glucose stable now HgbA1c 8.3, goal < 7.0 CBG monitoring SSI PCP follow-up with outpatient  Dysphagia Passes swallow On dysphagia 2 and thin liquid Speech on board We will resume p.o. meds  Other Stroke Risk Factors Advanced age  Other Active Problems, Findings, Recommendations and/or Plan Code status - Full code CKD - Stage 3a - creatinine - 1.51->1.50 B12 deficiency -continue oral B12 1,000 mcg daily Leukocytosis - WBC's - 11.8  (afebrile / temp - 99.7 ) Right LE cramping - will consider baclofen  Hospital day # 1  This patient is critically ill due to large ICH, cerebral edema, possible hemorrhagic brain mass, hypertension and at significant risk of neurological worsening, death form hematoma expansion, brain herniation, malignancy, hypertensive encephalopathy. This patient's care requires constant monitoring of vital signs, hemodynamics, respiratory and cardiac monitoring, review of multiple databases, neurological assessment, discussion with family, other specialists and medical decision making of high complexity. I spent 45 minutes of neurocritical care time in the care of this patient. I had long discussion with sisters at bedside, updated pt current condition, treatment plan and potential prognosis, and answered all the questions.  They expressed understanding and appreciation.   Rosalin Hawking, MD PhD Stroke Neurology 01/07/2022 5:45 PM   To contact Stroke Continuity provider, please refer to [email or web address] After hours, contact General Neurology

## 2022-01-07 NOTE — TOC CAGE-AID Note (Signed)
Transition of Care [facility]) - CAGE-AID Screening   Patient Details  Name: Justin [name] MRN: [id number] Date of Birth: 1940/11/18  Transition of Care [facility]) CM/SW Contact:    Coralee Pesa, [place] Phone Number: 01/07/2022, 10:29 AM   Clinical Narrative:  Per chart review, pt is unable to participate in assessment due to disorientation.  CAGE-AID Screening: Substance Abuse Screening unable to be completed due to: : Patient unable to participate             Substance Abuse Education Offered: No

## 2022-01-07 NOTE — Progress Notes (Signed)
EEG complete - results pending 

## 2022-01-07 NOTE — Progress Notes (Signed)
PT Cancellation Note  Patient Details Name: Justin [name] MRN: [id number] DOB: [birth year]   Cancelled Treatment:    Reason Eval/Treat Not Completed: Active bedrest order remains today until 1516. Will continue to follow and evaluate when appropriate.   [place], PT, DPT   Acute Rehabilitation Department   Sandra Cockayne 01/07/2022, 7:36 AM

## 2022-01-07 NOTE — Progress Notes (Signed)
SLP Cancellation Note  Patient Details Name: ARDON FRANKLIN MRN: [id number] DOB: 06/06/1941   Cancelled treatment:       Reason Eval/Treat Not Completed: Medical issues which prohibited therapy. Will f/u as able.     Osie Bond., M.A. [place] Office [phone]  Secure chat preferred  01/07/2022, 9:46 AM

## 2022-01-07 NOTE — Progress Notes (Signed)
  Transition of Care [facility]) Screening Note   Patient Details  Name: Justin [name] Date of Birth: 10/30/1940   Transition of Care [facility]) CM/SW Contact:    Benard Halsted, LCSW Phone Number: 01/07/2022, 11:42 AM    Transition of Care Department [facility]) has reviewed patient and no TOC needs have been identified at this time. We will continue to monitor patient advancement through interdisciplinary progression rounds and for any therapy needs. If new patient transition needs arise, please place a TOC consult.

## 2022-01-07 NOTE — Progress Notes (Signed)
OT Cancellation Note  Patient Details Name: NAJIR ROOP MRN: [id number] DOB: 08-Oct-1940   Cancelled Treatment:    Reason Eval/Treat Not Completed: Active bedrest order (will assess when activity orders updated.)  [facility] 01/07/2022, 7:35 AM Maurie Boettcher, OT/L   Acute OT Clinical Specialist Acute Rehabilitation Services Pager [phone] Office [phone]

## 2022-01-07 NOTE — Progress Notes (Signed)
OT Cancellation Note  Patient Details Name: CALISTRO RAUF MRN: [id number] DOB: 04-Nov-1940   Cancelled Treatment:    Reason Eval/Treat Not Completed: Active bedrest order (Will assess when activity orders updated.)  Chalyn Amescua,H[place]ARY 01/07/2022, 2:06 PM Maurie Boettcher, OT/L   Acute OT Clinical Specialist Acute Rehabilitation Services Pager [phone] Office [phone]

## 2022-01-07 NOTE — Procedures (Signed)
Patient Name: Justin [name]  MRN: [id number]  Epilepsy Attending: Lora Havens  Referring Physician/Provider: Greta Doom, MD Date: 01/07/2022 Duration: 22.32 mins  Patient history: 81 year old man presenting for evaluation of right-sided numbness and weakness having been found down on the ground.  CT head with a left parietal occipital ICH. EEG to evaluate for seizure  Level of alertness:  lethargic   AEDs during EEG study: None  Technical aspects: This EEG study was done with scalp electrodes positioned according to the 10-20 International system of electrode placement. Electrical activity was acquired at a sampling rate of '500Hz'$  and reviewed with a high frequency filter of '70Hz'$  and a low frequency filter of '1Hz'$ . EEG data were recorded continuously and digitally stored.   Description: EEG showed near continuous 6 to 8 Hz theta alpha activity in left hemisphere.  There is EEG attenuation in right hemisphere.  There is also intermittent sharply contoured rhythmic 2 to 3 Hz delta slowing in left hemisphere without definite evolution.  Hyperventilation and photic stimulation were not performed.     ABNORMALITY -Lateralized rhythmic delta activity, left hemisphere -Continuous slow  IMPRESSION: This study is suggestive of cortical dysfunction in left hemisphere which is on the ictal-interictal continuum with low to intermediate potential for seizures. Additionally there is moderate to severe diffuse encephalopathy, nonspecific to etiology.  No definite seizures were seen throughout the recording.  If suspicion for ictal-interictal activity remains a concern, a prolonged study can be considered.   Justin [name] [name]

## 2022-01-08 ENCOUNTER — Inpatient Hospital Stay (HOSPITAL_COMMUNITY): Payer: Medicare Other

## 2022-01-08 DIAGNOSIS — I482 Chronic atrial fibrillation, unspecified: Secondary | ICD-10-CM | POA: Diagnosis not present

## 2022-01-08 DIAGNOSIS — I619 Nontraumatic intracerebral hemorrhage, unspecified: Secondary | ICD-10-CM

## 2022-01-08 DIAGNOSIS — I611 Nontraumatic intracerebral hemorrhage in hemisphere, cortical: Secondary | ICD-10-CM | POA: Diagnosis not present

## 2022-01-08 DIAGNOSIS — E0829 Diabetes mellitus due to underlying condition with other diabetic kidney complication: Secondary | ICD-10-CM | POA: Diagnosis not present

## 2022-01-08 LAB — BASIC METABOLIC PANEL
Anion gap: 6 (ref 5–15)
BUN: 13 mg/dL (ref 8–23)
CO2: 20 mmol/L — ABNORMAL LOW (ref 22–32)
Calcium: 7.9 mg/dL — ABNORMAL LOW (ref 8.9–10.3)
Chloride: 115 mmol/L — ABNORMAL HIGH (ref 98–111)
Creatinine, Ser: 1.29 mg/dL — ABNORMAL HIGH (ref 0.61–1.24)
GFR, Estimated: 55 mL/min — ABNORMAL LOW (ref >60)
Glucose, Bld: 113 mg/dL — ABNORMAL HIGH (ref 70–99)
Potassium: 3.5 mmol/L (ref 3.5–5.1)
Sodium: 141 mmol/L (ref 135–145)

## 2022-01-08 LAB — CBC
HCT: 31.8 % — ABNORMAL LOW (ref 39.0–52.0)
Hemoglobin: 10.3 g/dL — ABNORMAL LOW (ref 13.0–17.0)
MCH: 31.2 pg (ref 26.0–34.0)
MCHC: 32.4 g/dL (ref 30.0–36.0)
MCV: 96.4 fL (ref 80.0–100.0)
Platelets: 209 10*3/uL (ref 150–400)
RBC: 3.3 MIL/uL — ABNORMAL LOW (ref 4.22–5.81)
RDW: 15.2 % (ref 11.5–15.5)
WBC: 14.2 10*3/uL — ABNORMAL HIGH (ref 4.0–10.5)
nRBC: 0 % (ref 0.0–0.2)

## 2022-01-08 LAB — GLUCOSE, CAPILLARY
Glucose-Capillary: 127 mg/dL — ABNORMAL HIGH (ref 70–99)
Glucose-Capillary: 130 mg/dL — ABNORMAL HIGH (ref 70–99)
Glucose-Capillary: 153 mg/dL — ABNORMAL HIGH (ref 70–99)
Glucose-Capillary: 133 mg/dL — ABNORMAL HIGH (ref 70–99)
Glucose-Capillary: 139 mg/dL — ABNORMAL HIGH (ref 70–99)
Glucose-Capillary: 153 mg/dL — ABNORMAL HIGH (ref 70–99)

## 2022-01-08 MED ORDER — QUETIAPINE FUMARATE 25 MG PO TABS
12.5000 mg | ORAL_TABLET | Freq: Every day | ORAL | Status: DC
  Administered 2022-01-08: 12.5 mg via ORAL
  Filled 2022-01-08: qty 1

## 2022-01-08 MED ORDER — MELATONIN 3 MG PO TABS
3.0000 mg | ORAL_TABLET | Freq: Every day | ORAL | Status: DC
  Administered 2022-01-08: 3 mg via ORAL
  Filled 2022-01-08: qty 1

## 2022-01-08 MED ORDER — INSULIN ASPART 100 UNIT/ML IJ SOLN
0.0000 [IU] | Freq: Three times a day (TID) | INTRAMUSCULAR | Status: DC
  Administered 2022-01-08: 1 [IU] via SUBCUTANEOUS
  Administered 2022-01-09 – 2022-01-10 (×3): 2 [IU] via SUBCUTANEOUS
  Administered 2022-01-10: 1 [IU] via SUBCUTANEOUS

## 2022-01-08 MED ORDER — DICLOFENAC SODIUM 1 % EX GEL
2.0000 g | Freq: Four times a day (QID) | CUTANEOUS | Status: DC
  Administered 2022-01-08 – 2022-01-13 (×18): 2 g via TOPICAL
  Filled 2022-01-08: qty 100

## 2022-01-08 NOTE — Evaluation (Signed)
Physical Therapy Evaluation Patient Details Name: Justin [name] MRN: [id number] DOB: 11/26/1940 Today's Date: 01/08/2022  History of Present Illness  The pt is an 81 yo male presenting 7/12 with R-sided numbenss and weakness after being found down on the floor. CT shows L parietal occipital ICH. PMH includes: afib, CHF, DM II, HOH, HTN, HLD, and indwelling foley for urinary retention.   Clinical Impression  Pt in bed upon arrival of PT, agreeable to evaluation at this time. Prior to admission the pt was ambulating without use of assistive device, and independent with ADLs. However, the pt's sisters report they assist with IADLs and medication management. The pt now presents with limitations in functional mobility, strength, balance, awareness, ROM, and activity tolerance due to above dx, and will continue to benefit from skilled PT to address these deficits. The pt currently requires totalA to complete bed mobility, and totalA of 2 to attempt sit-stand transfers. The pt was able to progress from modA to maintain static sitting balance to minG at times with increased verbal cues for positioning. Given prior level of independence, will recommend acute inpatient rehab once medically stable for d/c to maximize functional recovery and decrease caregiver burden.         Recommendations for follow up therapy are one component of a multi-disciplinary discharge planning process, led by the attending physician.  Recommendations may be updated based on patient status, additional functional criteria and insurance authorization.  Follow Up Recommendations Acute inpatient rehab (3hours/day)      Assistance Recommended at Discharge Frequent or constant Supervision/Assistance  Patient can return home with the following  Two people to help with walking and/or transfers;Two people to help with bathing/dressing/bathroom;Assistance with cooking/housework;Assistance with feeding;Direct supervision/assist for  medications management;Direct supervision/assist for financial management;Assist for transportation;Help with stairs or ramp for entrance    Equipment Recommendations  (defer to post acute)  Recommendations for Other Services  Rehab consult    Functional Status Assessment Patient has had a recent decline in their functional status and demonstrates the ability to make significant improvements in function in a reasonable and predictable amount of time.     Precautions / Restrictions Precautions Precautions: Fall Restrictions Weight Bearing Restrictions: No      Mobility  Bed Mobility Overal bed mobility: Needs Assistance Bed Mobility: Supine to Sit, Sit to Supine     Supine to sit: Total assist, +2 for physical assistance Sit to supine: Total assist, +2 for physical assistance   General bed mobility comments: pt unable to assist with coordinated movements to EOB without totalA to BLE and trunk. Pt then needing mod-maxA to maintain upright but progressed to minA-minG. totalA to return to supine    Transfers Overall transfer level: Needs assistance Equipment used: 2 person hand held assist Transfers: Sit to/from Stand Sit to Stand: Total assist, +2 physical assistance           General transfer comment: totalA with use of bed pad to elevate hips, pt unable to straighten RLE, maintained R ankle in PF and knee in flexion. dependent on therapists to maintain standing       Modified Rankin (Stroke Patients Only) Modified Rankin (Stroke Patients Only) Pre-Morbid Rankin Score: Moderate disability Modified Rankin: Severe disability     Balance Overall balance assessment: Needs assistance Sitting-balance support: No upper extremity supported Sitting balance-Leahy Scale: Fair Sitting balance - Comments: falling to R with static sitting, from modA to minG at times Postural control: Right lateral lean Standing balance support: Bilateral upper  extremity supported Standing  balance-Leahy Scale: Zero Standing balance comment: dependent on therapists                             Pertinent Vitals/Pain Pain Assessment Pain Assessment: Faces Faces Pain Scale: Hurts even more Pain Location: BLE with ROM or movement, at times pt would tense legs and moan, possible muscle spasms Pain Descriptors / Indicators: Grimacing, Moaning Pain Intervention(s): Limited activity within patient's tolerance, Monitored during session, Repositioned    Home Living Family/patient expects to be discharged to:: Skilled nursing facility Living Arrangements: Alone Available Help at Discharge: Available 24 hours/day Type of Home: House Home Access: Stairs to enter                Prior Function Prior Level of Function : Needs assist             Mobility Comments: pt ambulating without need for RW, sisters report no recent falls ADLs Comments: Sisters were helping with finances, Educational psychologist, appointments, cooking; pt was bathing/dressing aend taking himself to the bathroom without AD     Hand Dominance   Dominant Hand: Right    Extremity/Trunk Assessment   Upper Extremity Assessment Upper Extremity Assessment: Defer to OT evaluation    Lower Extremity Assessment Lower Extremity Assessment: RLE deficits/detail;LLE deficits/detail RLE Deficits / Details: resting with significant knee flexion and is difficult to extend, pt also grimacing and moaning with attempts to PROM RLE.  further assessment limited by cognition LLE Deficits / Details: pt able to move through partial ROM, unable to extend at knee against gravity, but able to initiate movements in toes and knee    Cervical / Trunk Assessment Cervical / Trunk Assessment: Kyphotic (L cervical rotation)  Communication   Communication: HOH;Expressive difficulties  Cognition Arousal/Alertness: Awake/alert Behavior During Therapy: WFL for tasks assessed/performed Overall Cognitive Status: Difficult to  assess                                 General Comments: Per sisters, pt has only finished 6th grade and does not read or write well. He was able to turn head towards verbal stimuli and track with increased time. pt following simple commands with increased time, poor awareness to R side unless given max cues. only stating simple one-word responses such as "okay" or "yeah"        General Comments General comments (skin integrity, edema, etc.): VSS on RA        Assessment/Plan    PT Assessment Patient needs continued PT services  PT Problem List Decreased strength;Decreased range of motion;Decreased activity tolerance;Decreased balance;Decreased mobility;Decreased coordination;Decreased cognition;Decreased safety awareness       PT Treatment Interventions DME instruction;Stair training;Gait training;Functional mobility training;Therapeutic activities;Therapeutic exercise;Balance training;Neuromuscular re-education;Patient/family education    PT Goals (Current goals can be found in the Care Plan section)  Acute Rehab PT Goals Patient Stated Goal: none stated, pt sisters are hoping for rehab and functional recovery PT Goal Formulation: With patient/family Time For Goal Achievement: 01/22/22 Potential to Achieve Goals: Good    Frequency Min 4X/week     Co-evaluation PT/OT/SLP Co-Evaluation/Treatment: Yes Reason for Co-Treatment: Complexity of the patient's impairments (multi-system involvement);Necessary to address cognition/behavior during functional activity;For patient/therapist safety;To address functional/ADL transfers PT goals addressed during session: Mobility/safety with mobility;Balance;Strengthening/ROM         AM-PAC PT "6 Clicks" Mobility  Outcome Measure  Help needed turning from your back to your side while in a flat bed without using bedrails?: Total Help needed moving from lying on your back to sitting on the side of a flat bed without using  bedrails?: Total Help needed moving to and from a bed to a chair (including a wheelchair)?: Total Help needed standing up from a chair using your arms (e.g., wheelchair or bedside chair)?: Total Help needed to walk in hospital room?: Total Help needed climbing 3-5 steps with a railing? : Total 6 Click Score: 6    End of Session Equipment Utilized During Treatment: Gait belt Activity Tolerance: Patient tolerated treatment well;Patient limited by fatigue Patient left: in bed;with call bell/phone within reach;with bed alarm set;with family/visitor present Nurse Communication: Mobility status PT Visit Diagnosis: Unsteadiness on feet (R26.81);Other abnormalities of gait and mobility (R26.89);Muscle weakness (generalized) (M62.81);Hemiplegia and hemiparesis Hemiplegia - Right/Left: Right Hemiplegia - dominant/non-dominant: Dominant Hemiplegia - caused by: Nontraumatic intracerebral hemorrhage    Time: 9977-4142 PT Time Calculation (min) (ACUTE ONLY): 22 min   Charges:   PT Evaluation $PT Eval Moderate Complexity: 1 Mod          Justin [name], PT, DPT   Acute Rehabilitation Department  Justin [name] 01/08/2022, 2:33 PM

## 2022-01-08 NOTE — Progress Notes (Addendum)
STROKE TEAM PROGRESS NOTE   INTERVAL HISTORY Family is at bedside.  RN at bedside. Was febrile overnight at 101.4 was given Tylenol.  WBC @ 14 down from 16 yesterday. Patient is awake and oriented to self, states age as "81". Unable to state place. Pupils unequal Rigth 4, Left 3. Left gaze preference, Has disconjugate gaze, left eye abduction, cannot cross midline, right side visual deficit. Right facial droop, severe dysarthria. Able to mimic, not able to really follow commands. Left upper is antigravity, left leg can wiggle toes and roll leg. Right leg weakness with spasms and increased tone, limited movement due to pain. Right arm is flaccid with increased tone. V S stable. Will transfer out of unit today. Decrease IVF to 50cc/hr. Will order melatonin and seroquel for sleep    OBJECTIVE Vitals:   01/08/22 0300 01/08/22 0400 01/08/22 0500 01/08/22 0600  BP: (!) 139/58 136/73 (!) 118/54 136/65  Pulse: 79 67 70 79  Resp: (!) 23 19 (!) 26 (!) 21  Temp:  (!) 101.4 F (38.6 C)    TempSrc:  Axillary    SpO2: 96% 93% 95% 94%  Weight:        CBC:  Recent Labs  Lab 01/06/22 1035 01/06/22 1114 01/07/22 0810 01/08/22 0438  WBC 11.8*  --  16.0* 14.2*  NEUTROABS 7.6  --   --   --   HGB 11.1*   < > 10.7* 10.3*  HCT 35.0*   < > 32.4* 31.8*  MCV 98.9  --  95.0 96.4  PLT 274  --  268 209   < > = values in this interval not displayed.     Basic Metabolic Panel:  Recent Labs  Lab 01/07/22 0810 01/08/22 0438  NA 140 141  K 3.8 3.5  CL 114* 115*  CO2 20* 20*  GLUCOSE 155* 113*  BUN 12 13  CREATININE 1.27* 1.29*  CALCIUM 8.3* 7.9*     Lipid Panel:     Component Value Date/Time   CHOL 79 01/06/2022 1618   TRIG 111 01/06/2022 1618   HDL 24 (L) 01/06/2022 1618   CHOLHDL 3.3 01/06/2022 1618   VLDL 22 01/06/2022 1618   LDLCALC 33 01/06/2022 1618   HgbA1c:  Lab Results  Component Value Date   HGBA1C 8.3 (H) 01/06/2022   Urine Drug Screen:     Component Value Date/Time    LABOPIA NONE DETECTED 01/06/2022 1803   COCAINSCRNUR NONE DETECTED 01/06/2022 1803   LABBENZ NONE DETECTED 01/06/2022 1803   AMPHETMU NONE DETECTED 01/06/2022 1803   THCU NONE DETECTED 01/06/2022 1803   LABBARB NONE DETECTED 01/06/2022 1803    Alcohol Level     Component Value Date/Time   ETH <10 01/06/2022 1035    IMAGING   DG HIP UNILAT WITH PELVIS 2-3 VIEWS RIGHT  Result Date: 01/07/2022 CLINICAL DATA:  Right hip pain. EXAM: DG HIP (WITH OR WITHOUT PELVIS) 2-3V RIGHT COMPARISON:  None Available. FINDINGS: Mildly decreased bone mineralization. Mild bilateral femoroacetabular joint space narrowing and peripheral osteophytosis. Plate and screw fixation of the superior pubic symphysis. Single transverse screw fixation of the left sacroiliac joint and left hemisacrum. Likely moderate bilateral sacroiliac joint space narrowing. No acute fracture is seen.  No dislocation. IMPRESSION: No acute fracture. Electronically Signed   By: Yvonne Kendall M.D.   On: 01/07/2022 21:38   DG Knee Right Port  Result Date: 01/07/2022 CLINICAL DATA:  Right knee pain.  Best obtainable level images. EXAM: PORTABLE RIGHT KNEE -  1-2 VIEW COMPARISON:  None Available. FINDINGS: Moderate medial compartment joint space narrowing. Moderate mediolateral compartment chondrocalcinosis. Minimal chronic enthesopathic change at the quadriceps insertion of the patella. No joint effusion. No acute fracture is seen. No dislocation. IMPRESSION: Moderate medial compartment osteoarthritis.  Chondrocalcinosis. Electronically Signed   By: Yvonne Kendall M.D.   On: 01/07/2022 21:35   MR BRAIN W WO CONTRAST  Result Date: 01/07/2022 CLINICAL DATA:  Neuro deficit, acute, stroke suspected. EXAM: MRI HEAD WITHOUT AND WITH CONTRAST TECHNIQUE: Multiplanar, multiecho pulse sequences of the brain and surrounding structures were obtained without and with intravenous contrast. CONTRAST:  19m GADAVIST GADOBUTROL 1 MMOL/ML IV SOLN COMPARISON:  Head  CT December 07, 2021. FINDINGS: Severely motion degraded study. Brain: Large intraparenchymal hemorrhage centered in the left parietal lobe with hematocrit level, measuring approximately 5.8 x 5.6 x 4.2 cm. Ill-defined heterogeneous mass lesion is noted in the medial/aspect of the lesion measuring at least 2.6 x 1.8 cm. Surrounding vasogenic edema with mass effect on the atrium of the left lateral ventricle and adjacent cerebral sulci without evidence of entrapment. Area of T2 hyperintensity is observed in the right temporal lobe without clear underlying lesion. No hydrocephalus or extra-axial collection. No acute territory infarct. Postcontrast images are nondiagnostic. Vascular: Normal flow voids. 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 Sinuses/Orbits: Bilateral lens surgery. Paranasal sinuses are clear. Other: None. IMPRESSION: 1. Severely motion degraded study redemonstrated large left parietal intraparenchymal hematoma measuring up to 5.8 cm with local mass effect without evidence of hydrocephalus or entrapment. A 2.6 cm mass lesion is identified in the medial aspect of the intraparenchymal hemorrhage. 2. T2 hyperintensity within the white matter of the right temporal lobe. Underlying lesion cannot be excluded given severe motion artifact. 3. Recommend repeat study when clinically appropriate. Electronically Signed   By: KPedro EarlsM.D.   On: 01/07/2022 13:39   ECHOCARDIOGRAM COMPLETE  Result Date: 01/07/2022    ECHOCARDIOGRAM REPORT   Patient Name:   JNYLAN NEVELDYE Date of Exam: 01/07/2022 Medical Rec #:  [id number]  Height:       72.0 in Accession #:    [id number] Weight:       219.4 lb Date of Birth:  902/01/1941  BSA:          2.216 m Patient Age:    81years    BP:           141/76 mmHg Patient Gender: M           HR:           103  bpm. Exam Location:  Inpatient Procedure: 2D Echo, Cardiac Doppler and Color Doppler Indications:    Stroke  History:        Patient has no prior history of Echocardiogram examinations.                 CHF; Risk Factors:Hypertension and Diabetes.  Sonographer:    TJyl HeinzReferring Phys: [id number] ARORA  Sonographer Comments: Image acquisition challenging due to uncooperative patient. IMPRESSIONS  1. Left ventricular ejection fraction, by estimation, is 60 to 65%. The left ventricle has normal function. The left ventricle has no regional wall motion abnormalities. Left ventricular diastolic parameters are indeterminate.  2. Peak RV-RA gradient 30 mmHg. Unable to visualize IVC. Right ventricular systolic function  is normal. The right ventricular size is normal.  3. Left atrial size was moderately dilated.  4. Right atrial size was moderately dilated.  5. The mitral valve is normal in structure. Trivial mitral valve regurgitation. No evidence of mitral stenosis.  6. The aortic valve is tricuspid. There is mild calcification of the aortic valve. Aortic valve regurgitation is not visualized. No aortic stenosis is present.  7. The patient was in atrial fibrillation. FINDINGS  Left Ventricle: Left ventricular ejection fraction, by estimation, is 60 to 65%. The left ventricle has normal function. The left ventricle has no regional wall motion abnormalities. The left ventricular internal cavity size was normal in size. There is  no left ventricular hypertrophy. Left ventricular diastolic parameters are indeterminate. Right Ventricle: Peak RV-RA gradient 30 mmHg. Unable to visualize IVC. The right ventricular size is normal. No increase in right ventricular wall thickness. Right ventricular systolic function is normal. Left Atrium: Left atrial size was moderately dilated. Right Atrium: Right atrial size was moderately dilated. Pericardium: There is no evidence of pericardial effusion. Mitral Valve: The mitral valve  is normal in structure. Mild mitral annular calcification. Trivial mitral valve regurgitation. No evidence of mitral valve stenosis. Tricuspid Valve: The tricuspid valve is normal in structure. Tricuspid valve regurgitation is trivial. Aortic Valve: The aortic valve is tricuspid. There is mild calcification of the aortic valve. Aortic valve regurgitation is not visualized. No aortic stenosis is present. Aortic valve peak gradient measures 10.5 mmHg. Pulmonic Valve: The pulmonic valve was normal in structure. Pulmonic valve regurgitation is not visualized. Aorta: The aortic root is normal in size and structure. Venous: The inferior vena cava was not well visualized. IAS/Shunts: No atrial level shunt detected by color flow Doppler.  LEFT VENTRICLE PLAX 2D LVIDd:         5.40 cm     Diastology LVIDs:         3.60 cm     LV e' medial:    10.10 cm/s LV PW:         1.00 cm     LV E/e' medial:  9.9 LV IVS:        0.90 cm     LV e' lateral:   9.17 cm/s LVOT diam:     2.20 cm     LV E/e' lateral: 10.9 LV SV:         69 LV SV Index:   31 LVOT Area:     3.80 cm  LV Volumes (MOD) LV vol d, MOD A2C: 91.9 ml LV vol d, MOD A4C: 70.0 ml LV vol s, MOD A2C: 38.8 ml LV vol s, MOD A4C: 30.7 ml LV SV MOD A2C:     53.1 ml LV SV MOD A4C:     70.0 ml LV SV MOD BP:      47.6 ml RIGHT VENTRICLE RV Basal diam:  3.60 cm RV Mid diam:    3.40 cm RV S prime:     12.70 cm/s TAPSE (M-mode): 1.8 cm LEFT ATRIUM              Index        RIGHT ATRIUM           Index LA diam:        5.10 cm  2.30 cm/m   RA Area:     24.00 cm LA Vol (A2C):   102.0 ml 46.03 ml/m  RA Volume:   71.10 ml  32.08 ml/m LA Vol (A4C):  92.9 ml  41.92 ml/m LA Biplane Vol: 99.4 ml  44.85 ml/m  AORTIC VALVE AV Area (Vmax): 2.58 cm AV Vmax:        162.00 cm/s AV Peak Grad:   10.5 mmHg LVOT Vmax:      110.00 cm/s LVOT Vmean:     73.200 cm/s LVOT VTI:       0.182 m  AORTA Ao Root diam: 3.40 cm Ao Asc diam:  3.30 cm MITRAL VALVE               TRICUSPID VALVE MV Area (PHT):  4.68 cm    TR Peak grad:   30.0 mmHg MV Decel Time: 162 msec    TR Vmax:        274.00 cm/s MV E velocity: 99.80 cm/s                            SHUNTS                            Systemic VTI:  0.18 m                            Systemic Diam: 2.20 cm Dalton McleanMD Electronically signed by Franki Monte Signature Date/Time: 01/07/2022/10:06:15 AM    Final    EEG adult  Result Date: 01/07/2022 Lora Havens, MD     01/07/2022  9:02 AM Patient Name: TARL CEPHAS MRN: [id number] Epilepsy Attending: Lora Havens Referring Physician/Provider: Greta Doom, MD Date: 01/07/2022 Duration: 22.32 mins Patient history: 81 year old man presenting for evaluation of right-sided numbness and weakness having been found down on the ground.  CT head with a left parietal occipital ICH. EEG to evaluate for seizure Level of alertness:  lethargic AEDs during EEG study: None Technical aspects: This EEG study was done with scalp electrodes positioned according to the 10-20 International system of electrode placement. Electrical activity was acquired at a sampling rate of '500Hz'$  and reviewed with a high frequency filter of '70Hz'$  and a low frequency filter of '1Hz'$ . EEG data were recorded continuously and digitally stored. Description: EEG showed near continuous 6 to 8 Hz theta alpha activity in left hemisphere.  There is EEG attenuation in right hemisphere.  There is also intermittent sharply contoured rhythmic 2 to 3 Hz delta slowing in left hemisphere without definite evolution.  Hyperventilation and photic stimulation were not performed.   ABNORMALITY -Lateralized rhythmic delta activity, left hemisphere -Continuous slow IMPRESSION: This study is suggestive of cortical dysfunction in left hemisphere which is on the ictal-interictal continuum with low to intermediate potential for seizures. Additionally there is moderate to severe diffuse encephalopathy, nonspecific to etiology.  No definite seizures were seen throughout  the recording. If suspicion for ictal-interictal activity remains a concern, a prolonged study can be considered. Lora Havens   CT ANGIO HEAD NECK W WO CM  Result Date: 01/07/2022 CLINICAL DATA:  Follow-up examination for intracranial hemorrhage. EXAM: CT ANGIOGRAPHY HEAD AND NECK TECHNIQUE: Multidetector CT imaging of the head and neck was performed using the standard protocol during bolus administration of intravenous contrast. Multiplanar CT image reconstructions and MIPs were obtained to evaluate the vascular anatomy. Carotid stenosis measurements (when applicable) are obtained utilizing NASCET criteria, using the distal internal carotid diameter as the denominator. RADIATION DOSE REDUCTION: This exam was performed according to the departmental dose-optimization  program which includes automated exposure control, adjustment of the mA and/or kV according to patient size and/or use of iterative reconstruction technique. CONTRAST:  33m OMNIPAQUE IOHEXOL 350 MG/ML SOLN COMPARISON:  Prior CT from earlier the same day. FINDINGS: CT HEAD FINDINGS Brain: Again seen is a large intraparenchymal hemorrhage centered at the left parieto-occipital region. This measures 5.9 x 5.0 x 4.3 cm in greatest dimensions (estimated volume 63 mL, previously 64 mL when measured in a similar fashion). Surrounding vasogenic edema and regional mass effect with trace left-to-right shift, similar to prior. Possible trace extra-axial extension of hemorrhage along the posterior falx noted, also stable. No visible intraventricular extension. No new intracranial hemorrhage since prior. Parenchymal hypodensity involving the right temporal lobe again noted, nonspecific, but stable from prior. This could potentially reflect a more subacute evolving cortical contusion at this location. No other acute large vessel territory infarct. No definite mass lesion. Ventricular size and morphology is relatively stable without hydrocephalus or trapping.  Underlying atrophy with chronic small vessel ischemic disease again noted. Vascular: No hyperdense vessel. Calcified atherosclerosis present about the skull base. Skull: Scalp soft tissues demonstrate no acute finding. Calvarium intact. Sinuses: Paranasal sinuses remain largely clear. Trace left mastoid effusion noted. Orbits: Globes orbital soft tissues demonstrate no acute finding. Review of the MIP images confirms the above findings CTA NECK FINDINGS Aortic arch: Visualized aortic arch normal caliber with standard 3 vessel morphology. Mild for age atheromatous change about the arch itself. No stenosis about the origin the great vessels. Right carotid system: Right common and internal carotid arteries patent without stenosis or dissection. Left carotid system: Left common and internal carotid arteries patent without stenosis or dissection. Vertebral arteries: Both vertebral arteries arise from the subclavian arteries. No proximal subclavian artery stenosis. Vertebral arteries patent without significant stenosis or dissection. Skeleton: Heterogeneous appearance of the clivus and left occipital condyle, of uncertain etiology or significance, but stable from prior neck CT from 01/16/2021, suggesting a benign etiology. No other discrete or worrisome osseous lesions. 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 Additional moderate spondylosis noted at C4-5 through C6-7. Patient is edentulous. Other neck: No other acute soft tissue abnormality within the neck. 1 cm right thyroid nodule noted, of doubtful significance given size and patient age, no follow-up imaging recommended (ref: J Am Coll Radiol. 2015 Feb;12(2): 143-50). Upper chest: 11 mm subpleural nodule present at the peripheral left upper lobe (series 10, image 10). An additional smaller 5 mm nodule noted within the adjacent left upper lobe as well (series 10, image 13). Visualized upper chest demonstrates no other acute finding. Review  of the MIP images confirms the above findings CTA HEAD FINDINGS Anterior circulation: Petrous segments patent bilaterally. Mild for age atheromatous change within the carotid siphons without significant stenosis. A1 segments patent. Normal anterior communicating artery complex. Anterior cerebral arteries patent without stenosis. No M1 stenosis or occlusion. No proximal MCA branch occlusion. Distal MCA branches well perfused bilaterally. Asymmetric prominence of the vascularity within the left cerebral hemisphere felt to be reactive in nature due to the adjacent bleed and/or edema. Posterior circulation: Both vertebral arteries patent to the vertebrobasilar junction without significant stenosis. Left vertebral artery slightly dominant. Both PICA patent. Basilar patent to its distal aspect without stenosis. Superior cerebellar arteries patent bilaterally. Both PCAs primarily supplied via the basilar. Right PCA widely patent to its distal aspect. Left PCA is patent proximally, not well seen distally, likely due to edema from the adjacent hemorrhage in this region. Venous sinuses:  Grossly patent allowing for timing the contrast bolus. The mid and anterior aspect of the superior sagittal sinus is unopacified on this exam. Anatomic variants: None significant. No vascular malformation seen underlying the acute cerebral hemorrhage. No intracranial aneurysm. Review of the MIP images confirms the above findings IMPRESSION: CT HEAD IMPRESSION: 1. No significant interval change in size and morphology of the acute intraparenchymal hemorrhage positioned at the left parieto-occipital region, estimated volume 63 mL. Similar surrounding edema with mild left-to-right shift. 2. Similar appearance of parenchymal hypodensity involving the right temporal lobe. Finding remains indeterminate, but could potentially reflect edema related to an evolving subacute cortical contusion at this location. Possible underlying mass not excluded.  Attention at follow-up MRI recommended. 3. No other new acute intracranial abnormality. CTA HEAD AND NECK IMPRESSION: 1. Negative CTA for large vessel occlusion or other emergent finding. No vascular malformation seen underlying the acute left cerebral hemorrhage. 2. Mild for age atheromatous disease. No hemodynamically significant or correctable stenosis about the major arterial vasculature of the head and neck. 3. Few left upper lobe pulmonary nodules as above, largest of which measures 11 mm. These are incompletely assessed on this exam. Per Fleischner Society Guidelines, recommend prompt non-contrast Chest CT for further evaluation. These guidelines do not apply to immunocompromised patients and patients with cancer. Follow up in patients with significant comorbidities as clinically warranted. For lung cancer screening, adhere to Lung-RADS guidelines. Reference: Radiology. 2017; 284(1):228-43. Electronically Signed   By: Jeannine Boga M.D.   On: 01/07/2022 04:27   DG Pelvis Portable  Result Date: 01/06/2022 CLINICAL DATA:  Intracerebral hemorrhage EXAM: PORTABLE PELVIS 1-2 VIEWS COMPARISON:  None Available. FINDINGS: Plate and screw fixation device across the pubic symphysis. Screw across the left SI joint. Hip joints are symmetric and unremarkable. No acute bony abnormality. Specifically, no fracture, subluxation, or dislocation. IMPRESSION: No acute bony abnormality. Electronically Signed   By: Rolm Baptise M.D.   On: 01/06/2022 21:05   DG Abd 1 View  Result Date: 01/06/2022 CLINICAL DATA:  Intracerebral hemorrhage EXAM: ABDOMEN - 1 VIEW COMPARISON:  None Available. FINDINGS: Nonobstructive bowel gas pattern. No organomegaly or free air. Calcified gallstones noted in the right upper quadrant. IMPRESSION: No acute findings. Cholelithiasis. Electronically Signed   By: Rolm Baptise M.D.   On: 01/06/2022 21:04   DG CHEST PORT 1 VIEW  Result Date: 01/06/2022 CLINICAL DATA:  Intracerebral  hemorrhage EXAM: PORTABLE CHEST 1 VIEW COMPARISON:  03/08/2016 FINDINGS: Moderate-sized hiatal hernia. Heart is normal size. Scarring in the left lung base. Rounded density in the left mid lung measuring 3 cm. Old left rib fractures. No acute bony abnormality. IMPRESSION: 3 cm rounded nodular density in the left mid lung concerning for pulmonary nodule/mass. This could be further evaluated with chest CT. Electronically Signed   By: Rolm Baptise M.D.   On: 01/06/2022 21:04   CT HEAD CODE STROKE WO CONTRAST  Result Date: 01/06/2022 CLINICAL DATA:  Code stroke. Neuro deficit, acute, stroke suspected. EXAM: CT HEAD WITHOUT CONTRAST TECHNIQUE: Contiguous axial images were obtained from the base of the skull through the vertex without intravenous contrast. RADIATION DOSE REDUCTION: This exam was performed according to the departmental dose-optimization program which includes automated exposure control, adjustment of the mA and/or kV according to patient size and/or use of iterative reconstruction technique. COMPARISON:  No pertinent prior exams available for comparison. FINDINGS: Brain: Mild-to-moderate cerebral atrophy. Mild cerebellar atrophy. Large heterogeneous parenchymal hemorrhage centered within the left parietal lobe, measuring 5.0 x 4.6 x  5.6 cm. Moderate surrounding edema. Associated mass effect with partial effacement of the posterior left lateral ventricle. 2 mm rightward midline shift. 5.0 X 1.5 cm focus of abnormal hypodensity within the right temporal lobe periventricular white matter, indeterminate in etiology (for instance as seen on series 3, images 9-11). Cavum septum pellucidum and cavum vergae Vascular: No hyperdense vessel. Atherosclerotic calcifications. Skull: No fracture or aggressive osseous lesion. Sinuses/Orbits: No mass or acute finding within the imaged orbits. No significant paranasal sinus disease at the imaged levels. These results were called by telephone at the time of  interpretation on 01/06/2022 at 11:00 am to provider [facility] , who verbally acknowledged these results. IMPRESSION: Large acute parenchymal hemorrhage centered within the left parietal lobe, measuring 5.0 x 5.6 x 4.6 cm. Given the heterogeneous appearance of this hemorrhage, a brain MRI without and with contrast is recommended to assess for an underlying mass. Moderate surrounding edema. Mass effect with partial effacement of the posterior left lateral ventricle and 2 mm rightward midline shift. 5.0 x 1.5 cm focus of abnormal hypodensity within the right temporal lobe periventricular white matter, indeterminate in etiology. Attention to this site is recommended at time of MRI follow-up. Mild-to-moderate cerebral atrophy. Comparatively mild cerebellar atrophy. Electronically Signed   By: Kellie Simmering D.O.   On: 01/06/2022 11:12     Transthoracic Echocardiogram  1. Left ventricular ejection fraction, by estimation, is 60 to 65%. The  left ventricle has normal function. The left ventricle has no regional  wall motion abnormalities. Left ventricular diastolic parameters are  indeterminate.   2. Peak RV-RA gradient 30 mmHg. Unable to visualize IVC. Right  ventricular systolic function is normal. The right ventricular size is  normal.   3. Left atrial size was moderately dilated.   4. Right atrial size was moderately dilated.   5. The mitral valve is normal in structure. Trivial mitral valve  regurgitation. No evidence of mitral stenosis.   6. The aortic valve is tricuspid. There is mild calcification of the  aortic valve. Aortic valve regurgitation is not visualized. No aortic  stenosis is present.   7. The patient was in atrial fibrillation.   ECG - atrial fibrillation - ventricular response 59 BPM. (See cardiology reading for complete details)   PHYSICAL EXAM  Temp:  [97.6 F (36.4 C)-101.4 F (38.6 C)] 101.4 F (38.6 C) (07/14 0400) Pulse Rate:  [59-92] 79 (07/14 0600) Resp:  [17-29] 21  (07/14 0600) BP: (118-152)/(53-82) 136/65 (07/14 0600) SpO2:  [93 %-99 %] 94 % (07/14 0600)  General - Well nourished, well developed, in NAD  Ophthalmologic - fundi not visualized due to noncooperation.  Cardiovascular - irregularly irregular heart rate and rhythm.  Neuro - Patient is awake and oriented to self, states age as "1". Unable to state place. Pupils unequal Rigth 4, Left 3. Left gaze preference, Has disconjugate gaze, left eye abduction, cannot cross midline, right side visual deficit. Right facial droop, severe dysarthria. Able to mimic, not able to really follow commands. Left upper is antigravity, left leg can wiggle toes and roll leg. Right leg weakness with spasms and increased tone, limited movement due to pain. Right arm is flaccid with increased tone. Sensation, coordination and gait not tested.   ASSESSMENT/PLAN Mr. HOA DERISO is a 81 y.o. male with history of atrial fibrillation on Eliquis, hypertension, iron deficiency anemia, B12 deficiency, hearing impaired, CHF (severe non ischemic cardiomyopathy), CKD, diabetes presenting with right-sided numbness weakness and left gaze preference after being found  on floor. The pt is unable to provide a reliable history. Eliquis reversed with Andexxa.  He did not receive TNK due to [place].  ICH - left parietal occipital ICH, could be related to Eliquis use vs hemorrhagic brain mass CT Head -  Large acute parenchymal hemorrhage centered within the left parietal lobe, measuring 5.0 x 5.6 x 4.6 cm. Mass effect with partial effacement of the posterior left lateral ventricle and 2 mm rightward midline shift.  CT head and neck unremarkable. CT head repeat no significant interval change in size and morphology of acute ICH.  Similar surrounding edema. MRI brain with and without contrast large left parietal intraparenchymal hematoma measuring up to 5.8 cm with local mass effect without evidence of hydrocephalus or entrapment. A 2.6 cm mass  lesion is identified in the medial aspect of the intraparenchymal hemorrhage. Recommend repeat study when clinically appropriate. Repeat MRI when able to rule out brain mass 2D Echo EF 60 to 65% LDL - 33 HgbA1c - 8.3 UDS - negative VTE prophylaxis - heparin subcu Eliquis (apixaban) daily prior to admission, now on No antithrombotic due to [place] Ongoing aggressive stroke risk factor management Therapy recommendations:  CIR Disposition:  Pending  Right temporal brain lesion CT head 5.0 x 1.5 cm focus of abnormal hypodensity within the right temporal lobe periventricular white matter, indeterminate in etiology.  MRI with and without contrast T2 hyperintensity within the white matter of the right temporal lobe. Underlying lesion cannot be excluded given severe motion artifact. Consider repeat MRI when able   Afib Cardiomyopathy On Eliquis PTA Status post Andexxa reversal On home digoxin, Aldactone, Coreg and losartan PTA for cardiomyopathy  Hypertension Home BP meds: Coreg ; Losartan ; aldactone Stable On home losartan SBP goal < 160 mm Hg  Long-term BP goal normotensive  Hyperlipidemia Home Lipid lowering medication: Lipitor 20 mg daily LDL 33, goal < 70 Will continue statin at discharge May not be SATURN candidate due to questionable tumor bleed  Diabetes Home diabetic meds: glucotrol ; metformin ; actos Current diabetic meds: none Glucose stable now HgbA1c 8.3, goal < 7.0 CBG monitoring SSI PCP follow-up with outpatient  Dysphagia Passes swallow On dysphagia 2 and thin liquid Speech on board  Insomnia/agitation  Sleep-wake cycle disturbance after ICH melatonin and seroquel '@HS'$   Other Stroke Risk Factors Advanced age  Other Active Problems, Findings, Recommendations and/or Plan Code status - Full code CKD - Stage 3a - creatinine - 1.51->1.50->1.29 B12 deficiency -continue oral B12 1,000 mcg daily Leukocytosis - WBC's - 11.8->14.2 Right LE cramping - ordered  voltaren gel  Hospital day # 2  Beulah Gandy DNP, ACNPC-AG  ATTENDING NOTE: I reviewed above note and agree with the assessment and plan. Pt was seen and examined.   Sisters are at the bedside. They reported that pt did not get good sleep last night and was agitated, trying to get out of bed. Otherwise, pt neuro stable. No other acute event overnight. Vital stable.  On exam, pt awake, alert, eyes open, orientated to age and self but not to place or time. No aphasia but very limited language output, severe dysarthria, severe hard of hearing, able to pantomime but not quite following simple commands due to hearing loss. Left gaze preference, barely cross midline, seems to have right visual neglect vs. HH, but not consistently blinking to visual threat bilaterally. Right pupil 4->66m, left pupil 3->236m Right facial droop. Tongue protrusion not cooperative. LUE and LLE against gravity, no drift. RUE 0/5 and RLE  2/5 to pain, both limbs increased muscle tone with mild pain on passive movement. Sensation, coordination and gait not tested.  Etiology for patient bleeding and right temporal hypodensity not quite clear, recommend repeat MRI with and without contrast once stabilized and able to tolerating MRI better.  Continue BP monitoring.  Start home melatonin and add Seroquel nightly for delirium and insomnia.  On diet, continue p.o. medication.  Will transfer out of ICU today.  For detailed assessment and plan, please refer to above/below as I have made changes wherever appropriate.   Rosalin Hawking, MD PhD Stroke Neurology 01/08/2022 6:55 PM  This patient is critically ill due to large ICH, cerebral edema, possible hemorrhagic brain mass, hypertension and at significant risk of neurological worsening, death form hematoma expansion, brain herniation, malignancy, hypertensive encephalopathy. This patient's care requires constant monitoring of vital signs, hemodynamics, respiratory and cardiac monitoring,  review of multiple databases, neurological assessment, discussion with family, other specialists and medical decision making of high complexity. I spent 35 minutes of neurocritical care time in the care of this patient. I had long discussion with sisters at bedside, updated pt current condition, treatment plan and potential prognosis, and answered all the questions.  They expressed understanding and appreciation.    To contact Stroke Continuity provider, please refer to [email or web address] After hours, contact General Neurology
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
## 2022-01-08 NOTE — Evaluation (Signed)
Occupational Therapy Evaluation Patient Details Name: Justin [name] MRN: [id number] DOB: [birth year] Today's Date: 01/08/2022   History of Present Illness The pt is an 81 yo male presenting 7/12 with R-sided numbenss and weakness after being found down on the floor. CT shows L parietal occipital ICH. PMH includes: afib, CHF, DM II, HOH, HTN, HLD, and indwelling foley for urinary retention.   Clinical Impression   PTA pt lives alone, is independent with ADL tasks and mobility and has had help from his sisters with IADL tasks, since his wife died in 2022-09-12.  At baseline, pt unable to read/write/most likely lower functioning after conversation with his sisters. Pt with L gaze preference, however attempting to assist with ADL tasks with mod A while sitting EOB. R hemiparesis with R lateral  lean/no blink to threat R side. Overall Max A +2 for mobility and Max A with ADL tasks. Will benefit from OT at SNF and will most likely need LTC. Pt with increased pina RLE, pulling RLE into flexion. Acute OT to follow.      Recommendations for follow up therapy are one component of a multi-disciplinary discharge planning process, led by the attending physician.  Recommendations may be updated based on patient status, additional functional criteria and insurance authorization.   Follow Up Recommendations  Skilled nursing-short term rehab (<3 hours/day)    Assistance Recommended at Discharge Frequent or constant Supervision/Assistance  Patient can return home with the following Two people to help with walking and/or transfers;Two people to help with bathing/dressing/bathroom;Assistance with cooking/housework;Assistance with feeding;Direct supervision/assist for medications management;Direct supervision/assist for financial management;Assist for transportation;Help with stairs or ramp for entrance (would need Harrel Lemon)    Functional Status Assessment  Patient has had a recent decline in their functional status and  demonstrates the ability to make significant improvements in function in a reasonable and predictable amount of time.  Equipment Recommendations  Wheelchair (measurements OT);Wheelchair cushion (measurements OT);Hospital bed;Other (comment) (hoyer)    Recommendations for Other Services       Precautions / Restrictions Precautions Precautions: Fall Restrictions Weight Bearing Restrictions: No      Mobility Bed Mobility Overal bed mobility: Needs Assistance Bed Mobility: Supine to Sit, Sit to Supine     Supine to sit: Total assist, +2 for physical assistance Sit to supine: Total assist, +2 for physical assistance   General bed mobility comments: pt unable to assist with coordinated movements to EOB without totalA to BLE and trunk. Pt then needing mod-maxA to maintain upright but progressed to minA-minG. totalA to return to supine    Transfers Overall transfer level: Needs assistance Equipment used: 2 person hand held assist Transfers: Sit to/from Stand Sit to Stand: Total assist, +2 physical assistance           General transfer comment: totalA with use of bed pad to elevate hips, pt unable to straighten RLE, maintained R ankle in PF and knee in flexion. dependent on therapists to maintain standing      Balance Overall balance assessment: Needs assistance Sitting-balance support: No upper extremity supported Sitting balance-Leahy Scale: Fair Sitting balance - Comments: falling to R with static sitting, from modA to minG at times Postural control: Right lateral lean Standing balance support: Bilateral upper extremity supported Standing balance-Leahy Scale: Zero Standing balance comment: dependent on therapists                           ADL either performed or assessed with clinical  judgement   ADL Overall ADL's : Needs assistance/impaired Eating/Feeding: Moderate assistance   Grooming: Moderate assistance   Upper Body Bathing: Moderate assistance    Lower Body Bathing: Maximal assistance;Sit to/from stand   Upper Body Dressing : Maximal assistance   Lower Body Dressing: Total assistance   Toilet Transfer: Total assistance Toilet Transfer Details (indicate cue type and reason): unable         Functional mobility during ADLs: Maximal assistance;+2 for physical assistance (bed mobility ; unable to stand)       Vision   Vision Assessment?: Vision impaired- to be further tested in functional context;Yes Eye Alignment: Impaired (comment) (strabismis at baseline?) Ocular Range of Motion: Restricted on the right Alignment/Gaze Preference: Gaze left Tracking/Visual Pursuits:  (does not track past midline) Saccades: Decreased speed of saccadic movement;Additional head turns occurred during testing;Additional eye shifts occurred during testing Visual Fields: Right visual field deficit     Perception     Praxis      Pertinent Vitals/Pain Pain Assessment Pain Assessment: Faces Faces Pain Scale: Hurts even more Pain Location: intermittently from BLE Pain Descriptors / Indicators: Grimacing, Moaning     Hand Dominance Right   Extremity/Trunk Assessment Upper Extremity Assessment Upper Extremity Assessment: RUE deficits/detail RUE Deficits / Details: no active movement observed   Lower Extremity Assessment Lower Extremity Assessment: Defer to PT evaluation RLE Deficits / Details: resting with significant knee flexion and is difficult to extend, pt also grimacing and moaning with attempts to PROM RLE.  further assessment limited by cognition LLE Deficits / Details: pt able to move through partial ROM, unable to extend at knee against gravity, but able to initiate movements in toes and knee   Cervical / Trunk Assessment Cervical / Trunk Assessment: Kyphotic (L cervical rotation; R lateral lean)   Communication Communication Communication: HOH;Expressive difficulties   Cognition Arousal/Alertness: Awake/alert Behavior  During Therapy: Flat affect Overall Cognitive Status: Difficult to assess                                 General Comments: Per sisters, pt has only finished 6th grade and does not read or write well. He was able to turn head towards verbal stimuli and track with increased time. pt following simple commands with increased time, poor awareness to R side unless given max cues. only stating simple one-word responses such as "okay" or "yeah"     General Comments  VSS on RA    Exercises     Shoulder Instructions      Home Living Family/patient expects to be discharged to:: Skilled nursing facility Living Arrangements: Alone Available Help at Discharge: Available 24 hours/day Type of Home: House Home Access: Stairs to enter                                Prior Functioning/Environment Prior Level of Function : Needs assist             Mobility Comments: pt ambulating without need for RW, sisters report no recent falls ADLs Comments: Sisters were helping with finances, medicaiton mgnt, appointments, cooking; pt was bathing/dressing aend taking himself to the bathroom without AD        OT Problem List: Decreased strength;Decreased range of motion;Decreased activity tolerance;Impaired balance (sitting and/or standing);Impaired vision/perception;Decreased coordination;Decreased cognition;Decreased safety awareness;Decreased knowledge of use of DME or AE;Decreased knowledge of precautions;Impaired sensation;Impaired  UE functional use;Pain;Impaired tone      OT Treatment/Interventions: Self-care/ADL training;Therapeutic exercise;Neuromuscular education;DME and/or AE instruction;Splinting;Therapeutic activities;Cognitive remediation/compensation;Visual/perceptual remediation/compensation;Patient/family education;Balance training    OT Goals(Current goals can be found in the care plan section) Acute Rehab OT Goals Patient Stated Goal: per sisters for him to make  a full recovery OT Goal Formulation: With patient/family Time For Goal Achievement: 01/22/22 Potential to Achieve Goals: Fair  OT Frequency: Min 2X/week    Co-evaluation PT/OT/SLP Co-Evaluation/Treatment: Yes Reason for Co-Treatment: Complexity of the patient's impairments (multi-system involvement);For patient/therapist safety;To address functional/ADL transfers PT goals addressed during session: Mobility/safety with mobility;Balance;Strengthening/ROM OT goals addressed during session: ADL's and self-care      AM-PAC OT "6 Clicks" Daily Activity     Outcome Measure Help from another person eating meals?: A Lot Help from another person taking care of personal grooming?: A Lot Help from another person toileting, which includes using toliet, bedpan, or urinal?: Total Help from another person bathing (including washing, rinsing, drying)?: Total Help from another person to put on and taking off regular upper body clothing?: Total Help from another person to put on and taking off regular lower body clothing?: Total 6 Click Score: 8   End of Session Equipment Utilized During Treatment: Gait belt Nurse Communication: Mobility status;Need for lift equipment  Activity Tolerance: Patient tolerated treatment well Patient left: in bed;with call bell/phone within reach;with bed alarm set;with family/visitor present  OT Visit Diagnosis: Unsteadiness on feet (R26.81);Other abnormalities of gait and mobility (R26.89);Repeated falls (R29.6);Low vision, both eyes (H54.2);Other symptoms and signs involving the nervous system (R29.898);Other symptoms and signs involving cognitive function;Hemiplegia and hemiparesis;Pain Hemiplegia - Right/Left: Right Hemiplegia - dominant/non-dominant: Dominant Hemiplegia - caused by: Nontraumatic intracerebral hemorrhage Pain - Right/Left: Right Pain - part of body: Leg                Time: 1610-9604 OT Time Calculation (min): 29 min Charges:  OT General  Charges $OT Visit: 1 Visit OT Evaluation $OT Eval Moderate Complexity: [place], OT/L   Acute OT Clinical Specialist [place] Pager [phone] Office [phone]   [facility] 01/08/2022, 3:32 PM

## 2022-01-08 NOTE — Progress Notes (Signed)
Speech Language Pathology Treatment: Dysphagia  Patient Details Name: Justin [name] MRN: [id number] DOB: [birth year] Today's Date: 01/08/2022 Time: 7948-0165 SLP Time Calculation (min) (ACUTE ONLY): 11 min  Assessment / Plan / Recommendation Clinical Impression  Pt was seen for f/u after clinical swallow eval on previous date. RN said she received in report that pt had some trouble with oral holding/pocketing at times. His sister was present for dinner last night and describes physical/cognitive status that impacted self-feeding but reports only one instance of coughing, otherwise denying overt difficulty when she was there. Pt self-fed trials of regular solids and thin liquids, looking grossly consistent to previous date, including oral residue and delayed coughing after trials stopped. Given h/o esophageal issues and frequent coughing with PO intake at home, family is however agreeable to proceed with MBS. Will leave on Dys 2 diet and thin liquids pending completion.    HPI HPI: The pt is an 81 yo male presenting 7/12 with R-sided numbenss and weakness after being found down on the floor. CT shows L parietal occipital ICH. MRI 7/13 severely motion degraded but revealed large left parietal intraparenchymal hematoma measuring up to 5.8 cm with 2.6 cm mass lesion. PMH includes: afib, CHF, DM II, HOH, HTN, HLD, and indwelling foley for urinary retention. Prior esophagram in 2021 due to reported trouble swallowing liquids and solids revealed small to moderate HH, esophageal dysmotility (likely presbyesophagus), and possible stricture in the distal esophagus      SLP Plan  MBS      Recommendations for follow up therapy are one component of a multi-disciplinary discharge planning process, led by the attending physician.  Recommendations may be updated based on patient status, additional functional criteria and insurance authorization.    Recommendations  Diet recommendations: Dysphagia 2 (fine  chop);Thin liquid Liquids provided via: Cup;Straw Medication Administration: Crushed with puree Supervision: Staff to assist with self feeding Compensations: Minimize environmental distractions;Slow rate;Small sips/bites;Follow solids with liquid Postural Changes and/or Swallow Maneuvers: Seated upright 90 degrees;Upright 30-60 min after meal                Oral Care Recommendations: Oral care BID Follow Up Recommendations: Acute inpatient rehab (3hours/day) Assistance recommended at discharge: Frequent or constant Supervision/Assistance SLP Visit Diagnosis: Dysphagia, unspecified (R13.10) Plan: MBS           Justin [name]  01/08/2022, 12:24 PM

## 2022-01-08 NOTE — Progress Notes (Signed)
Modified Barium Swallow Progress Note  Patient Details  Name: Justin [name] MRN: [id number] Date of Birth: 1940/08/24  Today's Date: 01/08/2022  Modified Barium Swallow completed.  Full report located under Chart Review in the Imaging Section.  Brief recommendations include the following:  Clinical Impression  Pt has a mild oral dysphagia with pharyngeal phase relatively more functional. He has some reduced control orally, resulting in intermittent premature spillage of thin liquids and dereased bolus cohesion. He initially pulled the whole graham cracker back out of his mouth, needing some verbal cues and presentation via spoon for him to initiate mastication. A barium tablet initially did not clear his oral cavity, but when given multiple additional boluses, it did. UES relaxation was reduced and resulted in trace residuals in the pyriform sinuses. Although no overt baruim remained in his esophagus upon brief scan, he did have trace amounts of thin liquids observed to backflow into the pharynx during the study. Recommend continuing Dys 2 diet and thin liquids at this time.   Swallow Evaluation Recommendations       SLP Diet Recommendations: Dysphagia 2 (Fine chop) solids;Thin liquid   Liquid Administration via: Cup;Straw   Medication Administration: Crushed with puree   Supervision: Staff to assist with self feeding   Compensations: Minimize environmental distractions;Slow rate;Small sips/bites;Follow solids with liquid   Postural Changes: Seated upright at 90 degrees;Remain semi-upright after after feeds/meals (Comment)   Oral Care Recommendations: Oral care BID        Osie Bond., M.A. [place] Office [phone]  Secure chat preferred  01/08/2022,3:10 PM

## 2022-01-09 ENCOUNTER — Inpatient Hospital Stay (HOSPITAL_COMMUNITY): Payer: Medicare Other

## 2022-01-09 DIAGNOSIS — E119 Type 2 diabetes mellitus without complications: Secondary | ICD-10-CM | POA: Diagnosis not present

## 2022-01-09 DIAGNOSIS — I10 Essential (primary) hypertension: Secondary | ICD-10-CM | POA: Diagnosis not present

## 2022-01-09 DIAGNOSIS — I611 Nontraumatic intracerebral hemorrhage in hemisphere, cortical: Secondary | ICD-10-CM | POA: Diagnosis not present

## 2022-01-09 DIAGNOSIS — Z794 Long term (current) use of insulin: Secondary | ICD-10-CM

## 2022-01-09 LAB — BASIC METABOLIC PANEL
Anion gap: 10 (ref 5–15)
BUN: 13 mg/dL (ref 8–23)
CO2: 20 mmol/L — ABNORMAL LOW (ref 22–32)
Calcium: 7.8 mg/dL — ABNORMAL LOW (ref 8.9–10.3)
Chloride: 110 mmol/L (ref 98–111)
Creatinine, Ser: 1.35 mg/dL — ABNORMAL HIGH (ref 0.61–1.24)
GFR, Estimated: 52 mL/min — ABNORMAL LOW (ref >60)
Glucose, Bld: 133 mg/dL — ABNORMAL HIGH (ref 70–99)
Potassium: 3 mmol/L — ABNORMAL LOW (ref 3.5–5.1)
Sodium: 140 mmol/L (ref 135–145)

## 2022-01-09 LAB — GLUCOSE, CAPILLARY
Glucose-Capillary: 101 mg/dL — ABNORMAL HIGH (ref 70–99)
Glucose-Capillary: 156 mg/dL — ABNORMAL HIGH (ref 70–99)
Glucose-Capillary: 140 mg/dL — ABNORMAL HIGH (ref 70–99)
Glucose-Capillary: 111 mg/dL — ABNORMAL HIGH (ref 70–99)

## 2022-01-09 LAB — CBC
HCT: 29.1 % — ABNORMAL LOW (ref 39.0–52.0)
Hemoglobin: 9.7 g/dL — ABNORMAL LOW (ref 13.0–17.0)
MCH: 31.4 pg (ref 26.0–34.0)
MCHC: 33.3 g/dL (ref 30.0–36.0)
MCV: 94.2 fL (ref 80.0–100.0)
Platelets: 239 10*3/uL (ref 150–400)
RBC: 3.09 MIL/uL — ABNORMAL LOW (ref 4.22–5.81)
RDW: 15.2 % (ref 11.5–15.5)
WBC: 12.3 10*3/uL — ABNORMAL HIGH (ref 4.0–10.5)
nRBC: 0 % (ref 0.0–0.2)

## 2022-01-09 LAB — PHOSPHORUS: Phosphorus: 2.7 mg/dL (ref 2.5–4.6)

## 2022-01-09 MED ORDER — QUETIAPINE FUMARATE 25 MG PO TABS
12.5000 mg | ORAL_TABLET | Freq: Two times a day (BID) | ORAL | Status: DC | PRN
  Administered 2022-01-09 – 2022-01-11 (×2): 12.5 mg via ORAL
  Filled 2022-01-09 (×2): qty 1

## 2022-01-09 MED ORDER — INSULIN ASPART 100 UNIT/ML IJ SOLN
0.0000 [IU] | Freq: Three times a day (TID) | INTRAMUSCULAR | Status: DC
  Administered 2022-01-10 – 2022-01-11 (×3): 1 [IU] via SUBCUTANEOUS

## 2022-01-09 MED ORDER — POTASSIUM CHLORIDE 10 MEQ/100ML IV SOLN
INTRAVENOUS | Status: AC
  Filled 2022-01-09: qty 100

## 2022-01-09 MED ORDER — POTASSIUM CHLORIDE 10 MEQ/100ML IV SOLN
10.0000 meq | INTRAVENOUS | Status: AC
  Administered 2022-01-09 (×4): 10 meq via INTRAVENOUS
  Filled 2022-01-09 (×4): qty 100

## 2022-01-09 MED ORDER — HALOPERIDOL LACTATE 5 MG/ML IJ SOLN
0.5000 mg | Freq: Once | INTRAMUSCULAR | Status: AC
  Administered 2022-01-09: 0.5 mg via INTRAVENOUS
  Filled 2022-01-09: qty 1

## 2022-01-09 MED ORDER — MELATONIN 3 MG PO TABS
3.0000 mg | ORAL_TABLET | Freq: Every evening | ORAL | Status: DC | PRN
  Administered 2022-01-09: 3 mg via ORAL
  Filled 2022-01-09: qty 1

## 2022-01-09 NOTE — Progress Notes (Signed)
Inpatient Rehab Admissions Coordinator Note:   Per PT/ST patient was screened for CIR candidacy by Lanisha Stepanian Danford Bad, CCC-SLP. At this time, pt not medically ready for CIR. Pt on Cleviprex. Pt also requiring total A+2 for mobility. I will not pursue a rehab consult at this time. CIR admissions team will follow and monitor for medical readiness and place a consult order if pt appears to be an appropriate candidate.    Gayland Curry, [place], [place] Admissions Coordinator [phone] 01/09/22 3:31 PM
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
## 2022-01-10 ENCOUNTER — Inpatient Hospital Stay (HOSPITAL_COMMUNITY): Payer: Medicare Other

## 2022-01-10 DIAGNOSIS — R569 Unspecified convulsions: Secondary | ICD-10-CM | POA: Diagnosis not present

## 2022-01-10 DIAGNOSIS — I611 Nontraumatic intracerebral hemorrhage in hemisphere, cortical: Secondary | ICD-10-CM | POA: Diagnosis not present

## 2022-01-10 LAB — GLUCOSE, CAPILLARY
Glucose-Capillary: 124 mg/dL — ABNORMAL HIGH (ref 70–99)
Glucose-Capillary: 147 mg/dL — ABNORMAL HIGH (ref 70–99)
Glucose-Capillary: 170 mg/dL — ABNORMAL HIGH (ref 70–99)
Glucose-Capillary: 122 mg/dL — ABNORMAL HIGH (ref 70–99)

## 2022-01-10 LAB — MAGNESIUM: Magnesium: 1 mg/dL — ABNORMAL LOW (ref 1.7–2.4)

## 2022-01-10 LAB — CBC
HCT: 31 % — ABNORMAL LOW (ref 39.0–52.0)
Hemoglobin: 10 g/dL — ABNORMAL LOW (ref 13.0–17.0)
MCH: 30.9 pg (ref 26.0–34.0)
MCHC: 32.3 g/dL (ref 30.0–36.0)
MCV: 95.7 fL (ref 80.0–100.0)
Platelets: 291 10*3/uL (ref 150–400)
RBC: 3.24 MIL/uL — ABNORMAL LOW (ref 4.22–5.81)
RDW: 15 % (ref 11.5–15.5)
WBC: 12.1 10*3/uL — ABNORMAL HIGH (ref 4.0–10.5)
nRBC: 0.2 % (ref 0.0–0.2)

## 2022-01-10 LAB — BASIC METABOLIC PANEL
Anion gap: 8 (ref 5–15)
BUN: 13 mg/dL (ref 8–23)
CO2: 19 mmol/L — ABNORMAL LOW (ref 22–32)
Calcium: 7.6 mg/dL — ABNORMAL LOW (ref 8.9–10.3)
Chloride: 111 mmol/L (ref 98–111)
Creatinine, Ser: 1.3 mg/dL — ABNORMAL HIGH (ref 0.61–1.24)
GFR, Estimated: 55 mL/min — ABNORMAL LOW (ref >60)
Glucose, Bld: 99 mg/dL (ref 70–99)
Potassium: 3.1 mmol/L — ABNORMAL LOW (ref 3.5–5.1)
Sodium: 138 mmol/L (ref 135–145)

## 2022-01-10 LAB — PHOSPHORUS: Phosphorus: 2.7 mg/dL (ref 2.5–4.6)

## 2022-01-10 MED ORDER — POTASSIUM CHLORIDE 10 MEQ/100ML IV SOLN
10.0000 meq | INTRAVENOUS | Status: AC
  Administered 2022-01-10 (×4): 10 meq via INTRAVENOUS
  Filled 2022-01-10 (×4): qty 100

## 2022-01-10 MED ORDER — LEVETIRACETAM 500 MG PO TABS
500.0000 mg | ORAL_TABLET | Freq: Two times a day (BID) | ORAL | Status: DC
  Administered 2022-01-10 – 2022-01-13 (×7): 500 mg via ORAL
  Filled 2022-01-10 (×7): qty 1

## 2022-01-10 MED ORDER — IOHEXOL 300 MG/ML  SOLN
75.0000 mL | Freq: Once | INTRAMUSCULAR | Status: AC | PRN
  Administered 2022-01-10: 75 mL via INTRAVENOUS

## 2022-01-10 MED ORDER — POTASSIUM CHLORIDE 20 MEQ PO PACK
40.0000 meq | PACK | Freq: Once | ORAL | Status: AC
  Administered 2022-01-10: 40 meq via ORAL
  Filled 2022-01-10: qty 2

## 2022-01-10 MED ORDER — MAGNESIUM SULFATE 4 GM/100ML IV SOLN
4.0000 g | Freq: Once | INTRAVENOUS | Status: AC
  Administered 2022-01-10: 4 g via INTRAVENOUS
  Filled 2022-01-10: qty 100
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
## 2022-01-10 NOTE — Progress Notes (Signed)
EEG complete - results pending 

## 2022-01-10 NOTE — Procedures (Signed)
Patient Name: Justin [name]  MRN: [id number]  Epilepsy Attending: Lora Havens  Referring Physician/Provider: August Albino, NP  Date: 01/10/2022 Duration: 23.06 mins  Patient history:  80yo  with L parietal ICH and R temporal brain lesion. Patient had focal arm twitching yesterday. EEG to evaluate for seizure.   Level of alertness: Awake  AEDs during EEG study: LEV  Technical aspects: This EEG study was done with scalp electrodes positioned according to the 10-20 International system of electrode placement. Electrical activity was acquired at a sampling rate of '500Hz'$  and reviewed with a high frequency filter of '70Hz'$  and a low frequency filter of '1Hz'$ . EEG data were recorded continuously and digitally stored.   Description: The posterior dominant rhythm consists of 8 Hz activity of moderate voltage (25-35 uV) seen predominantly in posterior head regions, asymmetric ( left<right) and reactive to eye opening and eye closing. Drowsiness was characterized by attenuation of the posterior background rhythm. EEG showed continuous polymorphic sharply contoured, at times rhythmic 3 to 6 Hz theta-delta slowing in left frontotemporal region. There is also intermittent generalized 3-'5hz'$  theta-delta slowing. Hyperventilation and photic stimulation were not performed.     ABNORMALITY - Continuous slow, left frontotemporal region  - Intermittent slow, generalized  IMPRESSION: This study is suggestive of cortical dysfunction arising from  left frontotemporal region  likely secondary to underlying structural abnormality. No seizures or definite epileptiform discharges were seen throughout the recording.  Dwain Huhn [name]

## 2022-01-11 ENCOUNTER — Encounter (HOSPITAL_COMMUNITY): Payer: Self-pay | Admitting: Student in an Organized Health Care Education/Training Program

## 2022-01-11 DIAGNOSIS — Z7189 Other specified counseling: Secondary | ICD-10-CM

## 2022-01-11 DIAGNOSIS — Z515 Encounter for palliative care: Secondary | ICD-10-CM | POA: Diagnosis not present

## 2022-01-11 DIAGNOSIS — I611 Nontraumatic intracerebral hemorrhage in hemisphere, cortical: Secondary | ICD-10-CM | POA: Diagnosis not present

## 2022-01-11 LAB — CBC
HCT: 32.3 % — ABNORMAL LOW (ref 39.0–52.0)
Hemoglobin: 10.3 g/dL — ABNORMAL LOW (ref 13.0–17.0)
MCH: 30.7 pg (ref 26.0–34.0)
MCHC: 31.9 g/dL (ref 30.0–36.0)
MCV: 96.1 fL (ref 80.0–100.0)
Platelets: 336 10*3/uL (ref 150–400)
RBC: 3.36 MIL/uL — ABNORMAL LOW (ref 4.22–5.81)
RDW: 15.1 % (ref 11.5–15.5)
WBC: 10.8 10*3/uL — ABNORMAL HIGH (ref 4.0–10.5)
nRBC: 0 % (ref 0.0–0.2)

## 2022-01-11 LAB — MAGNESIUM: Magnesium: 1.7 mg/dL (ref 1.7–2.4)

## 2022-01-11 LAB — BASIC METABOLIC PANEL
Anion gap: 6 (ref 5–15)
BUN: 10 mg/dL (ref 8–23)
CO2: 22 mmol/L (ref 22–32)
Calcium: 8.1 mg/dL — ABNORMAL LOW (ref 8.9–10.3)
Chloride: 112 mmol/L — ABNORMAL HIGH (ref 98–111)
Creatinine, Ser: 1.25 mg/dL — ABNORMAL HIGH (ref 0.61–1.24)
GFR, Estimated: 57 mL/min — ABNORMAL LOW (ref >60)
Glucose, Bld: 126 mg/dL — ABNORMAL HIGH (ref 70–99)
Potassium: 3.4 mmol/L — ABNORMAL LOW (ref 3.5–5.1)
Sodium: 140 mmol/L (ref 135–145)

## 2022-01-11 LAB — GLUCOSE, CAPILLARY
Glucose-Capillary: 132 mg/dL — ABNORMAL HIGH (ref 70–99)
Glucose-Capillary: 181 mg/dL — ABNORMAL HIGH (ref 70–99)
Glucose-Capillary: 170 mg/dL — ABNORMAL HIGH (ref 70–99)
Glucose-Capillary: 190 mg/dL — ABNORMAL HIGH (ref 70–99)

## 2022-01-11 MED ORDER — PANTOPRAZOLE SODIUM 40 MG PO TBEC
40.0000 mg | DELAYED_RELEASE_TABLET | Freq: Every day | ORAL | Status: DC
  Administered 2022-01-11: 40 mg via ORAL
  Filled 2022-01-11: qty 1

## 2022-01-11 MED ORDER — POTASSIUM CHLORIDE CRYS ER 20 MEQ PO TBCR
40.0000 meq | EXTENDED_RELEASE_TABLET | Freq: Once | ORAL | Status: AC
  Administered 2022-01-11: 40 meq via ORAL
  Filled 2022-01-11: qty 2

## 2022-01-11 MED ORDER — SENNOSIDES-DOCUSATE SODIUM 8.6-50 MG PO TABS: 2.0000 | ORAL_TABLET | Freq: Every evening | ORAL | Status: DC | PRN

## 2022-01-11 MED ORDER — OXYCODONE HCL 5 MG PO TABS
5.0000 mg | ORAL_TABLET | ORAL | Status: DC | PRN
  Administered 2022-01-11: 5 mg via ORAL
  Filled 2022-01-11: qty 1

## 2022-01-11 MED ORDER — MORPHINE SULFATE (PF) 2 MG/ML IV SOLN: 2.0000 mg | INTRAVENOUS | Status: DC | PRN

## 2022-01-11 MED ORDER — MAGNESIUM OXIDE -MG SUPPLEMENT 400 (240 MG) MG PO TABS
800.0000 mg | ORAL_TABLET | Freq: Once | ORAL | Status: AC
  Administered 2022-01-11: 800 mg via ORAL
  Filled 2022-01-11: qty 2

## 2022-01-11 MED ORDER — LOPERAMIDE HCL 2 MG PO CAPS
4.0000 mg | ORAL_CAPSULE | ORAL | Status: DC | PRN
  Administered 2022-01-11: 4 mg via ORAL
  Filled 2022-01-11: qty 2

## 2022-01-11 MED ORDER — GERHARDT'S BUTT CREAM
TOPICAL_CREAM | Freq: Three times a day (TID) | CUTANEOUS | Status: DC
  Administered 2022-01-13: 1 via TOPICAL
  Filled 2022-01-11: qty 1

## 2022-01-11 NOTE — Care Management Important Message (Signed)
Important Message  Patient Details  Name: Justin [name] MRN: [id number] Date of Birth: [birth year]   Medicare Important Message Given:  Yes     Orbie Pyo 01/11/2022, 3:23 PM

## 2022-01-11 NOTE — Progress Notes (Signed)
STROKE TEAM PROGRESS NOTE   INTERVAL HISTORY Sister and multiple family members at the bedside. He is awake and able to speak a few words and occasional short sentences now but remains quite aphasic with dense right hemiplegia.  EEG shows focal left temporoparietal slowing but no epileptiform activity.  He has not had any further focal seizures after starting Keppra.  Family wants to talk to oncology and  are willing to speak to palliative care as well.   OBJECTIVE Vitals:   01/10/22 2035 01/11/22 0000 01/11/22 0428 01/11/22 0733  BP: 139/74 (!) 141/72 (!) 150/73 (!) 151/66  Pulse: (!) 58 62 61 68  Resp: '19 19 20 20  '$ Temp: 98.7 F (37.1 C) 98.4 F (36.9 C) 98.8 F (37.1 C) 98.7 F (37.1 C)  TempSrc: Axillary Axillary Axillary Oral  SpO2: 96% 96% 94% 96%  Weight:        CBC:  Recent Labs  Lab 01/06/22 1035 01/06/22 1114 01/10/22 0151 01/11/22 0452  WBC 11.8*   < > 12.1* 10.8*  NEUTROABS 7.6  --   --   --   HGB 11.1*   < > 10.0* 10.3*  HCT 35.0*   < > 31.0* 32.3*  MCV 98.9   < > 95.7 96.1  PLT 274   < > 291 336   < > = values in this interval not displayed.    Basic Metabolic Panel:  Recent Labs  Lab 01/09/22 0320 01/10/22 0151 01/11/22 0452  NA 140 138 140  K 3.0* 3.1* 3.4*  CL 110 111 112*  CO2 20* 19* 22  GLUCOSE 133* 99 126*  BUN '13 13 10  '$ CREATININE 1.35* 1.30* 1.25*  CALCIUM 7.8* 7.6* 8.1*  MG  --  1.0* 1.7  PHOS 2.7 2.7  --     Lipid Panel:     Component Value Date/Time   CHOL 79 01/06/2022 1618   TRIG 111 01/06/2022 1618   HDL 24 (L) 01/06/2022 1618   CHOLHDL 3.3 01/06/2022 1618   VLDL 22 01/06/2022 1618   LDLCALC 33 01/06/2022 1618   HgbA1c:  Lab Results  Component Value Date   HGBA1C 8.3 (H) 01/06/2022   Urine Drug Screen:     Component Value Date/Time   LABOPIA NONE DETECTED 01/06/2022 1803   COCAINSCRNUR NONE DETECTED 01/06/2022 1803   LABBENZ NONE DETECTED 01/06/2022 1803   AMPHETMU NONE DETECTED 01/06/2022 1803   THCU NONE  DETECTED 01/06/2022 1803   LABBARB NONE DETECTED 01/06/2022 1803    Alcohol Level     Component Value Date/Time   ETH <10 01/06/2022 1035    IMAGING   EEG adult  Result Date: 01/10/2022 Lora Havens, MD     01/10/2022  5:45 PM Patient Name: Justin [name] MRN: [id number] Epilepsy Attending: Lora Havens Referring Physician/Provider: August Albino, NP Date: 01/10/2022 Duration: 23.06 mins Patient history:  80yo  with L parietal ICH and R temporal brain lesion. Patient had focal arm twitching yesterday. EEG to evaluate for seizure. Level of alertness: Awake AEDs during EEG study: LEV Technical aspects: This EEG study was done with scalp electrodes positioned according to the 10-20 International system of electrode placement. Electrical activity was acquired at a sampling rate of '500Hz'$  and reviewed with a high frequency filter of '70Hz'$  and a low frequency filter of '1Hz'$ . EEG data were recorded continuously and digitally stored. Description: The posterior dominant rhythm consists of 8 Hz activity of moderate voltage (25-35 uV) seen predominantly in posterior  head regions, asymmetric ( left<right) and reactive to eye opening and eye closing. Drowsiness was characterized by attenuation of the posterior background rhythm. EEG showed continuous polymorphic sharply contoured, at times rhythmic 3 to 6 Hz theta-delta slowing in left frontotemporal region. There is also intermittent generalized 3-'5hz'$  theta-delta slowing. Hyperventilation and photic stimulation were not performed.   ABNORMALITY - Continuous slow, left frontotemporal region - Intermittent slow, generalized IMPRESSION: This study is suggestive of cortical dysfunction arising from  left frontotemporal region  likely secondary to underlying structural abnormality. No seizures or definite epileptiform discharges were seen throughout the recording. Lora Havens   CT CHEST ABDOMEN PELVIS W CONTRAST  Result Date: 01/10/2022 CLINICAL DATA:  Brain  lesion, evaluate for primary malignancy EXAM: CT CHEST, ABDOMEN, AND PELVIS WITH CONTRAST TECHNIQUE: Multidetector CT imaging of the chest, abdomen and pelvis was performed following the standard protocol during bolus administration of intravenous contrast. RADIATION DOSE REDUCTION: This exam was performed according to the departmental dose-optimization program which includes automated exposure control, adjustment of the mA and/or kV according to patient size and/or use of iterative reconstruction technique. CONTRAST:  30m OMNIPAQUE IOHEXOL 300 MG/ML  SOLN COMPARISON:  05/08/2012 FINDINGS: CT CHEST FINDINGS Study is limited by motion artifacts. Cardiovascular: There is homogeneous enhancement of the thoracic aorta. There are no filling defects in central pulmonary artery branches. Mediastinum/Nodes: There is interval decrease in size of slightly enlarged lymph nodes. No new significant lymphadenopathy is seen. There is inhomogeneous attenuation in thyroid with small coarse calcifications. Lungs/Pleura: Evaluation of lung fields is limited by breathing motion artifacts. Minimal bilateral pleural effusions are seen. There is no pneumothorax. There is a 10 mm nodule in left upper lobe in image 49 of series 5. There is 2.7 cm nodule in left lower lobe in image 70. In image 85, there is 10 mm nodule in left lower lobe. In image 93, there is 2.1 cm nodule in right lower lobe. There are linear densities in the lower lung fields suggesting scarring or subsegmental atelectasis. Musculoskeletal: There are no focal lytic or sclerotic lesions. There are a few old healed left rib fractures. Gynecomastia is seen in both breasts. CT ABDOMEN PELVIS FINDINGS Hepatobiliary: There is fatty infiltration. There is no dilation of bile ducts. There are calcified gallbladder stones. Motion artifacts limit evaluation of gallbladder wall. Pancreas: No focal abnormalities are seen. Spleen: Spleen is not visualized in its usual position.  There is a 1.8 cm smoothly marginated soft tissue nodule in the anterior aspect of left upper quadrant, possibly a splenule. This finding appears stable. Adrenals/Urinary Tract: There is 4.5 x 3 cm smooth marginated nodule in left adrenal. Density measurements are proximally 26 Hounsfield units. There is no hydronephrosis. There are no renal or ureteral stones. There are few small demyelinating lesions in both kidneys, possibly cysts. Evaluation is limited by motion artifacts. Ureters are not dilated. Foley catheter is seen in the bladder. Stomach/Bowel: There is moderate sized fixed hiatal hernia. There is no significant small bowel dilation. Appendix is not dilated. There is contrast in the lumen of the colon. There is no significant wall thickening in colon. Few diverticula are seen in the colon without signs of focal acute diverticulitis. Vascular/Lymphatic: Unremarkable. Reproductive: Prostate is enlarged. Other: There is no ascites or pneumoperitoneum. Left inguinal hernia containing fat is seen. Small umbilical hernia containing fat is seen. Musculoskeletal: No focal lytic or sclerotic lesions are seen. Surgical hardware are noted in left SI joint and pubic symphysis. IMPRESSION: Motion limited study.  There are multiple nodules in both lungs largest measuring 2.7 cm in the left lower lobe. This finding suggests pulmonary metastatic disease. There is 4.5 cm nodule in the left adrenal. This may suggest incidental adenoma or primary or metastatic malignant neoplasm. Follow-up MRI may be considered. No significant lymphadenopathy is seen in chest, abdomen and pelvis. No focal abnormalities are seen in liver. No focal lytic or sclerotic lesions in the bony structures. Enlarged prostate. Gallbladder stones. Few diverticula are seen in colon. Other findings as described in the body of the report. Electronically Signed   By: Elmer Picker M.D.   On: 01/10/2022 13:20   CT HEAD W & WO CONTRAST (5MM)  Result  Date: 01/10/2022 CLINICAL DATA:  81 year old male code stroke presentation on 01/06/2022 with intracranial hemorrhage, and suspicion of hemorrhagic metastatic disease on MRI. EXAM: CT HEAD WITHOUT AND WITH CONTRAST TECHNIQUE: Contiguous axial images were obtained from the base of the skull through the vertex without and with intravenous contrast. RADIATION DOSE REDUCTION: This exam was performed according to the departmental dose-optimization program which includes automated exposure control, adjustment of the mA and/or kV according to patient size and/or use of iterative reconstruction technique. CONTRAST:  92m OMNIPAQUE IOHEXOL 300 MG/ML  SOLN COMPARISON:  Brain MRI 01/09/2022 and earlier. FINDINGS: Brain: Posterior left hemisphere lobar type hemorrhage with mixed density blood products and surrounding edema has not significantly changed in size or configuration since 01/06/2022, estimated volume 61 mL. Regional mass effect is stable including on the posterior corpus callosum and left lateral ventricle. Only trace rightward midline shift. No IVH. No ventriculomegaly. No extra-axial extension of blood. Right temporal lobe hypodensity suspicious for cytotoxic edema appears stable since presentation. No new intracranial hemorrhage or new gray-white abnormality. Basilar cisterns remain patent. Vascular: Calcified atherosclerosis at the skull base. Skull: Highly heterogeneous bone mineralization in the clivus where decreased T1 marrow signal demonstrated recently by MRI (series 4, image 22). However, this abnormality has been present since neck CT 01/16/2021 and not significantly changed. Also, there is involvement of the left occipital condyle which by MRI appears more normal. No other suspicious skull lesion identified. No new osseous abnormality identified. Sinuses/Orbits: Visualized paranasal sinuses and mastoids are stable and well aerated. Other: No acute orbit or scalp soft tissue finding. IMPRESSION: 1. Stable  non contrast CT appearance of the brain. Since 01/06/2022. Posterior left lobar hemorrhage (estimated 61 mL) with regional edema and mass effect. Second area suspicious for vasogenic edema in the right temporal lobe. Only mild intracranial mass effect. 2. Abnormal bone mineralization in the clivus and left occipital condyle is nonspecific, but stable since July 2022 and could be Paget's disease or similar etiology. Electronically Signed   By: HGenevie AnnM.D.   On: 01/10/2022 12:45   MR BRAIN WO CONTRAST  Result Date: 01/09/2022 CLINICAL DATA:  Intracranial hemorrhage adjacent to parenchymal mass. Possible second lesion within the right temporal lobe. The examination had to be discontinued prior to completion due to severe patient motion. EXAM: MRI HEAD WITHOUT CONTRAST TECHNIQUE: Multiplanar, multiecho pulse sequences of the brain and surrounding structures were obtained without intravenous contrast. COMPARISON:  MR head without and with contrast 01/07/2022 FINDINGS: Brain: Right parietal hematoma is similar in size, measuring 4.9 x 3.2 cm on the axial images. Surrounding vasogenic edema is similar the prior study. This effaces the posterior left lateral ventricle. 15 x 16 mm focal mass again noted in the posteromedial aspect of the hemorrhage. Focal T2 hyperintensity in the right temporal lobe  is again seen. A distinct mass is seen on the T2 weighted images with similar characteristics to the other lesion, measuring 11 x 8 mm. No other foci of T2 hyperintensity or hemorrhage is present. Vascular: Flow is present in the major intracranial arteries. Skull and upper cervical spine: Unchanged Sinuses/Orbits: The paranasal sinuses and mastoid air cells are clear. Bilateral lens replacements are noted. Globes and orbits are otherwise unremarkable. IMPRESSION: 1. Stable size of right parietal hematoma with surrounding vasogenic edema and mass effect. 2. Stable size of posteromedial left parietal mass lesion adjacent to  the hemorrhage. 3. Stable T2 hyperintensity in the right temporal lobe surrounding a focal 11 mm lesion with similar characteristics to the left parietal lesion. Multiple lesions suggest metastatic disease. 4. No other acute intracranial abnormality or significant interval change. Electronically Signed   By: San Morelle M.D.   On: 01/09/2022 15:23     Transthoracic Echocardiogram  1. Left ventricular ejection fraction, by estimation, is 60 to 65%. The  left ventricle has normal function. The left ventricle has no regional  wall motion abnormalities. Left ventricular diastolic parameters are  indeterminate.   2. Peak RV-RA gradient 30 mmHg. Unable to visualize IVC. Right  ventricular systolic function is normal. The right ventricular size is  normal.   3. Left atrial size was moderately dilated.   4. Right atrial size was moderately dilated.   5. The mitral valve is normal in structure. Trivial mitral valve  regurgitation. No evidence of mitral stenosis.   6. The aortic valve is tricuspid. There is mild calcification of the  aortic valve. Aortic valve regurgitation is not visualized. No aortic  stenosis is present.   7. The patient was in atrial fibrillation.   ECG - atrial fibrillation - ventricular response 59 BPM. (See cardiology reading for complete details)   PHYSICAL EXAM  Temp:  [98.2 F (36.8 C)-98.8 F (37.1 C)] 98.7 F (37.1 C) (07/17 0733) Pulse Rate:  [58-68] 68 (07/17 0733) Resp:  [16-20] 20 (07/17 0733) BP: (112-151)/(66-74) 151/66 (07/17 0733) SpO2:  [94 %-96 %] 96 % (07/17 0733)  General - Well nourished, well developed elderly Caucasian male with, in NAD  Ophthalmologic - fundi not visualized due to noncooperation.  Cardiovascular - irregularly irregular heart rate and rhythm.  Neuro - Awake and alert.  Globally aphasic.  He speaks a few words and occasional short sentences.  He will not answer orientation questions or folow commands. Left gaze  preference, Has dysconjugate gaze, left eye abduction, cannot cross midline, right side visual deficit. Right facial droop, severe dysarthria. Left upper is antigravity, left leg can wiggle toes and roll leg. Right leg weakness increased tone,  Right arm is flaccid with increased tone. Sensation, coordination and gait not tested.   ASSESSMENT/PLAN Justin [name] is a 81 y.o. male with history of atrial fibrillation on Eliquis, hypertension, iron deficiency anemia, B12 deficiency, hearing impaired, CHF (severe non ischemic cardiomyopathy), CKD, diabetes presenting with right-sided numbness weakness and left gaze preference after being found on floor. The pt is unable to provide a reliable history. Eliquis reversed with Andexxa.  He did not receive TNK due to [place].  ICH - left parietal occipital ICH, could be related to Eliquis use vs hemorrhagic brain metastasis with vasogenic edema  CT Head -  Large acute parenchymal hemorrhage centered within the left parietal lobe, measuring 5.0 x 5.6 x 4.6 cm. Mass effect with partial effacement of the posterior left lateral ventricle and 2 mm rightward  midline shift.  CT head and neck unremarkable. CT head repeat no significant interval change in size and morphology of acute ICH.  Similar surrounding edema. Repeat CT H with and without 01/10/2022 show Staebler clearance of the left posterior lobar hemorrhage with regional mass effect and edema.  Second area of vasogenic edema in the right temporal lobe. CT abd/checst/pelvis 01/10/2022 shows multiple nodules in both lungs largest one measuring 2.7 cm in the left lower lobe suggestive of metastatic disease.  No abnormalities in the abdomen and pelvis. MRI brain with and without contrast large left parietal intraparenchymal hematoma measuring up to 5.8 cm with local mass effect without evidence of hydrocephalus or entrapment. A 2.6 cm mass lesion is identified in the medial aspect of the intraparenchymal hemorrhage.  Recommend repeat study when clinically appropriate. Repeat MRI brain  1. Stable size of right parietal hematoma with surrounding vasogenic edema and mass effect. 2. Stable size of posteromedial left parietal mass lesion adjacent to the hemorrhage. 3. Stable T2 hyperintensity in the right temporal lobe surrounding a focal 11 mm lesion with similar characteristics to the left parietal lesion. Multiple lesions suggest metastatic disease. 4. No other acute intracranial abnormality or significant interval change. 2D Echo EF 60 to 65% LDL - 33 HgbA1c - 8.3 UDS - negative VTE prophylaxis - heparin subcu Eliquis (apixaban) daily prior to admission, now on No antithrombotic due to [place] Ongoing aggressive stroke risk factor management Therapy recommendations:  CIR Disposition:  Pending  Right temporal brain lesion with vasogenic edema  CT head 5.0 x 1.5 cm focus of abnormal hypodensity within the right temporal lobe periventricular white matter, indeterminate in etiology.  MRI with and without contrast T2 hyperintensity within the white matter of the right temporal lobe. Underlying lesion cannot be excluded given severe motion artifact.   Focal Seizures Start Keppra '500mg'$  PO BID rEEG to eval for seizures  Afib Cardiomyopathy On Eliquis PTA Status post Andexxa reversal On home digoxin, Aldactone, Coreg and losartan PTA for cardiomyopathy  Hypertension Home BP meds: Coreg ; Losartan ; aldactone Stable On home losartan SBP goal < 160 mm Hg  Long-term BP goal normotensive  Hyperlipidemia Home Lipid lowering medication: Lipitor 20 mg daily LDL 33, goal < 70 Will continue statin at discharge May not be SATURN candidate due to questionable tumor bleed  Diabetes Home diabetic meds: glucotrol ; metformin ; actos Current diabetic meds: none Glucose stable now HgbA1c 8.3, goal < 7.0 CBG monitoring SSI PCP follow-up with outpatient  Dysphagia Passes swallow On dysphagia 2 and thin  liquid Speech on board  Insomnia/agitation  Sleep-wake cycle disturbance after ICH melatonin and seroquel '@HS'$   Other Stroke Risk Factors Advanced age  Other Active Problems, Findings, Recommendations and/or Plan Code status - Full code CKD - Stage 3a - creatinine - 1.51->1.50->1.29->1.35->1.03 B12 deficiency -continue oral B12 1,000 mcg daily Leukocytosis - WBC's - 11.8->14.2->12.3->12.1. Afebrile Anemia Hgb 10.0 Right LE cramping - voltaren gel Hypokalemia K 3.1- replaced, Check in am   Hospital day # 5  Patient still has significant residual aphasia paroxysmal plegia and CT scan of the brain with contrast as well as CT scan of the chest show multiple lesions likely metastasis.  Prognosis is quite poor and is not a candidate for aggressive cancer work-up or treatment at this time due to significant neurological disability.  Recommend oncology and palliative care consult.  Long discussion with patient's sisters and multiple other family members at the bedside and answered questions.  Discussed with Dr. Reesa Chew.  Greater than 50% time during this 35-minute visit was spent in counseling and coordination of care and discussion with patient and care team and answering questions.  Stroke team will sign off.  Kindly call for questions.  Antony Contras, MD Medical Director [facility] Pager: [phone] 01/11/2022 11:30 AM   To contact Stroke Continuity provider, please refer to [email or web address] After hours, contact General Neurology

## 2022-01-11 NOTE — Consult Note (Signed)
Consultation Note Date: 01/11/2022   Patient Name: Justin [name]  DOB: 1941-06-07  MRN: [id number]  Age / Sex: 81 y.o., male  PCP: Lemmie Evens, MD Referring Physician: Damita Lack, MD  Reason for Consultation: Establishing goals of care  HPI/Patient Profile: 80 y.o. male  with past medical history of A-fib with RVR/on Eliquis, cellulitis of lower left leg, heart failure, DM, HTN/HLD, IDA, urinary retention, hearing loss, admitted on 01/06/2022 with left parietal occipital intracranial hemorrhage, right temporal brain lesion, history of chronic A-fib.   Clinical Assessment and Goals of Care: I have reviewed medical records including EPIC notes, labs and imaging, received report from RN, assessed the patient.  [name] is lying quietly in bed.  He appears acutely/chronically ill and quite frail.  He has hearing loss, and aphasia due to recent stroke.  He does not answer my orientation questions.  I am not sure if he can make his basic needs known.  There is no family at bedside at this time.  Call to sister, Netta Corrigan.  She shares that she and sisters are in [name] room at this point.  I return to [name] room to find physical therapy finishing a session.  Present today as sister/healthcare power of attorney, Waneta Martins, Sister Elane Fritz and her daughter Lynelle Smoke.  We meet at bedside to discuss diagnosis prognosis, GOC, EOL wishes, disposition and options.  I introduced Palliative Medicine as specialized medical care for people living with serious illness. It focuses on providing relief from the symptoms and stress of a serious illness. The goal is to improve quality of life for both the patient and the family.  We discussed a brief life review of the patient.  [name] was a "preemie" who never learned to read and write.  He did work on the farm, and in a Clinical cytogeneticist.  He was married for 25  years, but they were together for 20 years prior to marriage.  His wife died in September 21, 2022 of this year.  [name] had been living with his wife until her death.  Family was assisting with meals, IADLs, but [name] was able to complete his own ADLs.  We then focused on their current illness.  We talk in detail about [name] chest CT results from November 2013.  We talk about multiple pulmonary lung nodules, numerous borderline enlarged mediastinal and bilateral hilar lymph nodes.  We talk about his acute stroke and expected outcomes.  We talk about his poor by mouth intake and pured diet.  Family states that he does not like the pured diet, instead wanting to eat what he chooses.  We talk about a challenge, stopping IV fluids.  Family states that at this point they feel like he is not eating and drinking enough to sustain himself.    We talk about disposition.  Short-term rehab option, they shared that [name] stated earlier that he did not want to live this way.  He shared that he would never  want to live in a nursing home.  We talk about his ability and desire to participate in rehab.  Hospice care option, we talk about in-home and residential hospice care.  We talk about how to make choices for loved ones including 1) keeping them at the center of decision-making 2) are we doing something for them or to them 3) the person he was 5 years ago, how would that man tell them to care for him now.  The natural disease trajectory and expectations at EOL were discussed.  Advanced directives, concepts specific to code status, artifical feeding and hydration, and rehospitalization were considered and discussed.  Family readily agrees to DNR.  Orders adjusted.  Hospice and Palliative Care services outpatient were explained and offered.  We talk about the benefits of residential hospice for comfort and dignity at end-of-life.  Discussed the importance of continued conversation with family and the medical providers  regarding overall plan of care and treatment options, ensuring decisions are within the context of the patient's values and GOCs.  Questions and concerns were addressed.  The family was encouraged to call with questions or concerns.  PMT will continue to support holistically.  Conference with attending, bedside nursing staff, transition of care team related to patient condition, needs, goals of care, disposition.   HCPOA HCPOA -sister, Netta Corrigan.  [name] has stepchildren, but no natural children.  His sisters Hassan Rowan and Benjamine Mola were present during our [place]   At this point continue to treat the treatable but no CPR or intubation. Time for outcomes. Considering residential hospice.   Code Status/Advance Care Planning: DNR  Symptom Management:  Per hospitalist, no additional needs at this time.  Palliative Prophylaxis:  Frequent Pain Assessment and Oral Care  Additional Recommendations (Limitations, Scope, Preferences): At this point considering residential hospice  Psycho-social/Spiritual:  Desire for further Chaplaincy support:yes Additional Recommendations: Caregiving  Support/Resources and Education on Hospice  Prognosis:  Unable to determine, based on outcomes.  Weeks would not be surprising even with aggressive care, based on chronic illness burden, acute declines.  Discharge Planning:  To be determined, based on outcomes.        Primary Diagnoses: Present on Admission:  ICH (intracerebral hemorrhage) ([place])   I have reviewed the medical record, interviewed the patient and family, and examined the patient. The following aspects are pertinent.  Past Medical History:  Diagnosis Date   Atrial fibrillation ([place])    with rapid ventricular response   Cellulitis of left lower leg    CHF (congestive heart failure) ([place]) 32/99   systolic   Diabetes mellitus    Epistaxis    Hard of hearing    Hematuria    HTN (hypertension)     Hyperlipidemia    Hypertension    IDA (iron deficiency anemia)    Nasal sore    inner  nares and external right scabbed lesion- started Doxycycline 08/08/12   PFO (patent foramen ovale)    Urinary retention    indwelling foley   Vitamin B 12 deficiency    Social History   Socioeconomic History   Marital status: Widowed    Spouse name: Not on file   Number of children: Not on file   Years of education: Not on file   Highest education level: Not on file  Occupational History   Occupation: retired  Tobacco Use   Smoking status: Never   Smokeless tobacco: Never  Substance and Sexual Activity  Alcohol use: No    Alcohol/week: 0.0 standard drinks of alcohol   Drug use: No   Sexual activity: Yes  Other Topics Concern   Not on file  Social History Narrative   3 stepchildren   Social Determinants of Health   Financial Resource Strain: Not on file  Food Insecurity: Not on file  Transportation Needs: Not on file  Physical Activity: Not on file  Stress: Not on file  Social Connections: Not on file   Family History  Problem Relation Age of Onset   Lung cancer Father    Hypertension Mother    CVA Mother    Cancer - Colon Sister    Scheduled Meds:  Chlorhexidine Gluconate Cloth  6 each Topical Daily   diclofenac Sodium  2 g Topical QID   Gerhardt's butt cream   Topical TID   heparin injection (subcutaneous)  5,000 Units Subcutaneous Q8H   insulin aspart  0-6 Units Subcutaneous TID WC   insulin aspart  0-9 Units Subcutaneous TID WC   labetalol  20 mg Intravenous Once   levETIRAcetam  500 mg Oral BID   losartan  12.5 mg Oral Daily   pantoprazole  40 mg Oral QHS   vitamin B-12  1,000 mcg Oral Daily   Continuous Infusions:  sodium chloride 50 mL/hr at 01/10/22 2221   clevidipine Stopped (01/06/22 1534)   PRN Meds:.acetaminophen **OR** acetaminophen (TYLENOL) oral liquid 160 mg/5 mL **OR** acetaminophen, loperamide, LORazepam, melatonin, morphine injection, oxyCODONE,  QUEtiapine, senna-docusate Medications Prior to Admission:  Prior to Admission medications   Medication Sig Start Date End Date Taking? Authorizing Provider  acetaminophen (TYLENOL) 500 MG tablet Take 500 mg by mouth every 6 (six) hours as needed for moderate pain or headache.   Yes [provider]  apixaban (ELIQUIS) 5 MG TABS tablet Take 1 tablet (5 mg total) by mouth 2 (two) times daily. 09/29/21  Yes Branch, Alphonse Guild, MD  atorvastatin (LIPITOR) 20 MG tablet TAKE 1 TABLET BY MOUTH EVERY DAY AT 6 PM FOR HIGH CHOLESTEROL.STOP GEMFIBROZIL Patient taking differently: Take 20 mg by mouth daily. 06/11/20  Yes Branch, Alphonse Guild, MD  carvedilol (COREG) 25 MG tablet TAKE 1 TABLET BY MOUTH TWICE DAILY FOR YOUR HEART Patient taking differently: Take 25 mg by mouth 2 (two) times daily. 10/11/19  Yes Branch, Alphonse Guild, MD  digoxin (LANOXIN) 0.125 MG tablet TAKE 1 TABLET BY MOUTH EVERY DAY FOR YOUR HEART Patient taking differently: Take 0.125 mg by mouth daily. 10/11/19  Yes BranchAlphonse Guild, MD  Ferrous Sulfate (IRON PO) Take 45 mg by mouth daily.   Yes [provider]  glipiZIDE (GLUCOTROL) 10 MG tablet Take 0.5 tablets (5 mg total) by mouth 2 (two) times daily before a meal. Take adjusted dose of glipizide until follow up with PCP in 5 days Patient taking differently: Take 5 mg by mouth 2 (two) times daily before a meal. 08/21/15  Yes Barton Dubois, MD  latanoprost (XALATAN) 0.005 % ophthalmic solution Place 1 drop into both eyes at bedtime. 09/25/20  Yes [provider]  losartan (COZAAR) 25 MG tablet Take 1/2 (one-half) tablet by mouth once daily Patient taking differently: Take 12.5 mg by mouth daily. 11/21/20  Yes BranchAlphonse Guild, MD  metFORMIN (GLUCOPHAGE-XR) 500 MG 24 hr tablet Take 2 tablets (1,000 mg total) by mouth 2 (two) times daily. Stop this medication until you follow up with PCP in 5 days Patient taking differently: Take 1,000 mg by mouth 2 (two)  times daily.  08/21/15  Yes Barton Dubois, MD  pioglitazone (ACTOS) 45 MG tablet Take 45 mg by mouth daily.   Yes [provider]  spironolactone (ALDACTONE) 25 MG tablet Take 1 tablet by mouth once daily Patient taking differently: Take 25 mg by mouth daily. 07/27/21  Yes Branch, Alphonse Guild, MD  vitamin B-12 (CYANOCOBALAMIN) 1000 MCG tablet Take 1,000 mcg by mouth daily.    Yes [provider]   Allergies  Allergen Reactions   Niacin And Related     Unknown    Review of Systems  Unable to perform ROS: Other    Physical Exam Vitals and nursing note reviewed.  Neurological:     Mental Status: He is alert.     Vital Signs: BP 140/68 (BP Location: Right Arm)   Pulse 77   Temp 98.3 F (36.8 C) (Oral)   Resp (!) 24   Wt 99.5 kg Comment: from office visit June 2023  SpO2 98%   BMI 29.75 kg/m  Pain Scale: 0-10   Pain Score: Asleep   SpO2: SpO2: 98 % O2 Device:SpO2: 98 % O2 Flow Rate: .   IO: Intake/output summary:  Intake/Output Summary (Last 24 hours) at 01/11/2022 1235 Last data filed at 01/11/2022 1100 Gross per 24 hour  Intake 240 ml  Output 2925 ml  Net -2685 ml    LBM: Last BM Date : 01/07/22 Baseline Weight: Weight: 99.5 kg (from office visit June 2023) Most recent weight: Weight: 99.5 kg (from office visit June 2023)     Palliative Assessment/Data:   Flowsheet Rows    Flowsheet Row Most Recent Value  Intake Tab   Referral Department Hospitalist  Unit at Time of Referral Other (Comment)  Palliative Care Primary Diagnosis Neurology  Date Notified 01/10/22  Palliative Care Type New Palliative care  Reason for referral Clarify Goals of Care  Date of Admission 01/06/22  Date first seen by Palliative Care 01/11/22  # of days Palliative referral response time 1 Day(s)  # of days IP prior to Palliative referral 4  Clinical Assessment   Palliative Performance Scale Score 40%  Pain Max last 24 hours Not able to report  Pain Min Last 24 hours Not able to  report  Dyspnea Max Last 24 Hours Not able to report  Dyspnea Min Last 24 hours Not able to report  Psychosocial & Spiritual Assessment   Palliative Care Outcomes        Time In: 1300 Time Out: 1415 Time Total: 75 minutes  Greater than 50%  of this time was spent counseling and coordinating care related to the above assessment and plan.  Signed by: Drue Novel, NP   Please contact Palliative Medicine Team phone at [phone] for questions and concerns.  For individual provider: See Shea Evans

## 2022-01-11 NOTE — Progress Notes (Signed)
Physical Therapy Treatment Patient Details Name: Justin [name] MRN: [id number] DOB: Aug 03, 1940 Today's Date: 01/11/2022   History of Present Illness The pt is an 81 yo male presenting 7/12 with R-sided numbenss and weakness after being found down on the floor. CT shows L parietal occipital ICH. Additional imaging demonstrates multilpe lesions, concern for metastasis. PMH includes: afib, CHF, DM II, HOH, HTN, HLD, and indwelling foley for urinary retention.    PT Comments    Patient participated in treatment today. Requiring slightly less assistance with bed mobility but still max A to rise to EOB and back to supine. Tolerated sitting EOB with heavy rightward lean, pushing posteriorly at times, but possibly more limited 2/2 spasticity on RLE. There was some volitional movement opening and closing his Rt hand. Would not be able to tolerate standard w/c at this time due to spasticity and pushing/leaning Rt and back. Palliative care in room to consult family at end of session. Will watch for goal updates and change POC with rehab as indicated. Patient will continue to benefit from skilled physical therapy services to further improve independence with functional mobility.    Recommendations for follow up therapy are one component of a multi-disciplinary discharge planning process, led by the attending physician.  Recommendations may be updated based on patient status, additional functional criteria and insurance authorization.  Follow Up Recommendations  No PT follow up (Pending palliative consult, but unlikely needed. Wil update as appropriate)     Assistance Recommended at Discharge Frequent or constant Supervision/Assistance  Patient can return home with the following Two people to help with walking and/or transfers;Two people to help with bathing/dressing/bathroom;Assistance with cooking/housework;Assistance with feeding;Direct supervision/assist for medications management;Direct supervision/assist  for financial management;Assist for transportation;Help with stairs or ramp for entrance   Equipment Recommendations  None recommended by PT (Pending palliative consult - possible hosptial bed (patient would not be able to tolerate sitting in W/c at this time due to pushing and spasticity in RLE.))    Recommendations for Other Services       Precautions / Restrictions Precautions Precautions: Fall Restrictions Weight Bearing Restrictions: No     Mobility  Bed Mobility Overal bed mobility: Needs Assistance Bed Mobility: Supine to Sit, Sit to Supine     Supine to sit: HOB elevated, Max assist Sit to supine: Max assist   General bed mobility comments: Max assist with tactile cues to get to EOB, requires assist for BIL LEs and trunk, pt does pull trunk forward to assist with movement. Returning to bed pt able to follow commands for lying down onto left shoulder, but needs max assist for LEs and trunk to lift and center back into bed.    Transfers                   General transfer comment: Not attempted    Ambulation/Gait                   Stairs             Wheelchair Mobility    Modified Rankin (Stroke Patients Only) Modified Rankin (Stroke Patients Only) Pre-Morbid Rankin Score: Moderate disability Modified Rankin: Severe disability     Balance Overall balance assessment: Needs assistance Sitting-balance support: No upper extremity supported Sitting balance-Leahy Scale: Fair Sitting balance - Comments: falling to R with static sitting, from modA to minA at times. Unable to sit at min guard level this date. Did show improvement from mod A to min A  with focus on reaching and finding COB in midline. able to identify number of fingers being held at various distances. Postural control: Right lateral lean, Posterior lean                                  Cognition Arousal/Alertness: Awake/alert Behavior During Therapy: Flat  affect Overall Cognitive Status: Difficult to assess                                 General Comments: Per sisters, pt has only finished 6th grade and does not read or write well. He was able to turn head towards verbal stimuli and track with increased time. pt following simple commands with increased time, poor awareness to R side unless given max cues. only stating simple one-word responses such as "okay," whatever," no." Relatively unchanged 7/17        Exercises      General Comments General comments (skin integrity, edema, etc.): VSS on RA      Pertinent Vitals/Pain Pain Assessment Pain Assessment: Faces Faces Pain Scale: Hurts even more Pain Location: RLE/ hip flexor/groin Pain Descriptors / Indicators: Grimacing, Moaning Pain Intervention(s): Limited activity within patient's tolerance, Monitored during session, Repositioned    Home Living                          Prior Function            PT Goals (current goals can now be found in the care plan section) Acute Rehab PT Goals Patient Stated Goal: none stated PT Goal Formulation: With patient/family Time For Goal Achievement: 01/22/22 Potential to Achieve Goals: Poor Progress towards PT goals: Progressing toward goals    Frequency    Min 2X/week      PT Plan Frequency needs to be updated;Discharge plan needs to be updated    Co-evaluation              AM-PAC PT "6 Clicks" Mobility   Outcome Measure  Help needed turning from your back to your side while in a flat bed without using bedrails?: Total Help needed moving from lying on your back to sitting on the side of a flat bed without using bedrails?: Total Help needed moving to and from a bed to a chair (including a wheelchair)?: Total Help needed standing up from a chair using your arms (e.g., wheelchair or bedside chair)?: Total Help needed to walk in hospital room?: Total Help needed climbing 3-5 steps with a railing? :  Total 6 Click Score: 6    End of Session Equipment Utilized During Treatment: Gait belt Activity Tolerance: Patient tolerated treatment well;Patient limited by fatigue Patient left: in bed;with call bell/phone within reach;with bed alarm set;with family/visitor present;with nursing/sitter in room (palliative care) Nurse Communication: Mobility status PT Visit Diagnosis: Unsteadiness on feet (R26.81);Other abnormalities of gait and mobility (R26.89);Muscle weakness (generalized) (M62.81);Hemiplegia and hemiparesis Hemiplegia - Right/Left: Right Hemiplegia - dominant/non-dominant: Dominant Hemiplegia - caused by: Nontraumatic intracerebral hemorrhage     Time: 8119-1478 PT Time Calculation (min) (ACUTE ONLY): 24 min  Charges:  $Therapeutic Activity: 8-22 mins $Neuromuscular Re-education: 8-22 mins                     Candie Mile, PT    Ellouise Newer 01/11/2022, 4:04 PM

## 2022-01-11 NOTE — Progress Notes (Signed)
PROGRESS NOTE    Justin [name]  HWY:[id number] DOB: [birth year] DOA: 01/06/2022 PCP: Lemmie Evens, MD   Brief Narrative:  81 year old with history of A-fib on Eliquis, HTN, CHF, CKD, DM 2 who came to [facility] after complaints of right-sided weakness and he was found down on the floor.  He also reported of numbness and tingling.  CT of the head showed left parietal occipital ICH.  He was taken to the ICU and transferred to [facility].  He was given Andexxa to reverse his Eliquis.  There is also concerns of possible brain lesions therefore needing MRI brain with and without contrast.  Echocardiogram showed EF of 60 to 65%, LDL 33, A1c 8.3.  Eventually CTA of head and neck along with chest abdomen pelvis performed which showed multiple metastatic lesions.  After discussion with the patient and the family members, palliative care team was consulted.   Assessment & Plan:  Principal Problem:   ICH (intracerebral hemorrhage) (HCC)    Left parietal occipital intracranial hemorrhage Right temporal brain lesion - Secondary to Eliquis use versus hemorrhagic brain mass.  CT head showed large hemorrhagic lesion with midline shift.  CTA of the head and neck was unremarkable.  MRI brain was consistent with hematoma with concerns of 2.6 cm mass. -Due to patient's agitation we were only able to get MRI without contrast again yesterday which showed stable hematoma.  Today we will obtain CTA head and neck additionally we will also try chest abdomen pelvis. - 2D echo-EF 60%.  LDL 33.  A1c 8.3.  UDS-negative. - Eliquis currently discontinued.  He was on a prior to admission.  History of chronic atrial fibrillation - On outpatient Eliquis.  Currently this is on hold.  Pulmonary nodules 2013 - This was seen on CTA chest back in 2013 and follow-up was recommended.  But due to concerns of now brain lesion which could be malignant, CTA chest abdomen pelvis with contrast would be beneficial  CT  CAP showing multiple metastatic leisons.  Palliative care team consulted.  Essential hypertension -Currently on losartan.  IV as needed's ordered.  Hypokalemia/hypomagnesemia - Aggressive repletion. Check phos  Hyperlipidemia -Not on statin due to brain bleed  Diabetes mellitus type 2 - Not on any home medications currently.  We will order Accu-Cheks and sliding scale.  Dysphagia - Speech and swallow evaluation.  Dysphagia 2 diet  CKD stage IIIa - Creatinine stable around 1.3  Vitamin B12 deficiency - Supplements  Acute urinary retention - Foley placed  PT/OT-SNF  Overall patient has very poor prognosis given his metastatic disease, unknown primary.  My suggestion is that patient would benefit with focus on comfort care and hospice.  DVT prophylaxis: Subcu heparin Code Status: Full code Family Communication: Met with multiple family members at bedside  Status is: Inpatient Remains inpatient appropriate because: Awaiting palliative care consultation to determine further goals of care plan.  Subjective: Seen and examined at bedside, patient is little bit more awake this morning and interactive.  He rested well yesterday.  Examination: Constitutional: Not in acute distress Respiratory: Clear to auscultation bilaterally Cardiovascular: Normal sinus rhythm, no rubs Abdomen: Nontender nondistended good bowel sounds Musculoskeletal: No edema noted Skin: No rashes seen Neurologic: Alert to his name and follows basic commands.  Does have right-sided facial droop and dysarthric speech at times Psychiatric: Poor judgment and insight  Foley catheter is in place  Objective: Vitals:   01/11/22 0000 01/11/22 0428 01/11/22 0733 01/11/22 1205  BP: Marland Kitchen)  141/72 (!) 150/73 (!) 151/66 140/68  Pulse: 62 61 68 77  Resp: '19 20 20 ' (!) 24  Temp: 98.4 F (36.9 C) 98.8 F (37.1 C) 98.7 F (37.1 C) 98.3 F (36.8 C)  TempSrc: Axillary Axillary Oral Oral  SpO2: 96% 94% 96% 98%   Weight:        Intake/Output Summary (Last 24 hours) at 01/11/2022 1233 Last data filed at 01/11/2022 1100 Gross per 24 hour  Intake 240 ml  Output 2925 ml  Net -2685 ml   Filed Weights   01/07/22 0800  Weight: 99.5 kg     Data Reviewed:   CBC: Recent Labs  Lab 01/06/22 1035 01/06/22 1114 01/07/22 0810 01/08/22 0438 01/09/22 0320 01/10/22 0151 01/11/22 0452  WBC 11.8*  --  16.0* 14.2* 12.3* 12.1* 10.8*  NEUTROABS 7.6  --   --   --   --   --   --   HGB 11.1*   < > 10.7* 10.3* 9.7* 10.0* 10.3*  HCT 35.0*   < > 32.4* 31.8* 29.1* 31.0* 32.3*  MCV 98.9  --  95.0 96.4 94.2 95.7 96.1  PLT 274  --  268 209 239 291 336   < > = values in this interval not displayed.   Basic Metabolic Panel: Recent Labs  Lab 01/07/22 0810 01/08/22 0438 01/09/22 0320 01/10/22 0151 01/11/22 0452  NA 140 141 140 138 140  K 3.8 3.5 3.0* 3.1* 3.4*  CL 114* 115* 110 111 112*  CO2 20* 20* 20* 19* 22  GLUCOSE 155* 113* 133* 99 126*  BUN '12 13 13 13 10  ' CREATININE 1.27* 1.29* 1.35* 1.30* 1.25*  CALCIUM 8.3* 7.9* 7.8* 7.6* 8.1*  MG  --   --   --  1.0* 1.7  PHOS  --   --  2.7 2.7  --    GFR: Estimated Creatinine Clearance: 55.7 mL/min (A) (by C-G formula based on SCr of 1.25 mg/dL (H)). Liver Function Tests: Recent Labs  Lab 01/06/22 1035  AST 13*  ALT 14  ALKPHOS 36*  BILITOT 1.0  PROT 6.7  ALBUMIN 3.6   No results for input(s): "LIPASE", "AMYLASE" in the last 168 hours. No results for input(s): "AMMONIA" in the last 168 hours. Coagulation Profile: Recent Labs  Lab 01/06/22 1035  INR 1.5*   Cardiac Enzymes: No results for input(s): "CKTOTAL", "CKMB", "CKMBINDEX", "TROPONINI" in the last 168 hours. BNP (last 3 results) No results for input(s): "PROBNP" in the last 8760 hours. HbA1C: No results for input(s): "HGBA1C" in the last 72 hours.  CBG: Recent Labs  Lab 01/10/22 0615 01/10/22 1240 01/10/22 1714 01/10/22 2125 01/11/22 0754  GLUCAP 124* 147* 170* 122* 132*    Lipid Profile: No results for input(s): "CHOL", "HDL", "LDLCALC", "TRIG", "CHOLHDL", "LDLDIRECT" in the last 72 hours.  Thyroid Function Tests: No results for input(s): "TSH", "T4TOTAL", "FREET4", "T3FREE", "THYROIDAB" in the last 72 hours. Anemia Panel: No results for input(s): "VITAMINB12", "FOLATE", "FERRITIN", "TIBC", "IRON", "RETICCTPCT" in the last 72 hours. Sepsis Labs: No results for input(s): "PROCALCITON", "LATICACIDVEN" in the last 168 hours.  Recent Results (from the past 240 hour(s))  Resp Panel by RT-PCR (Flu A&B, Covid) Anterior Nasal Swab     Status: None   Collection Time: 01/06/22 10:35 AM   Specimen: Anterior Nasal Swab  Result Value Ref Range Status   SARS Coronavirus 2 by RT PCR NEGATIVE NEGATIVE Final    Comment: (NOTE) SARS-CoV-2 target nucleic acids are NOT DETECTED.  The  SARS-CoV-2 RNA is generally detectable in upper respiratory specimens during the acute phase of infection. The lowest concentration of SARS-CoV-2 viral copies this assay can detect is 138 copies/mL. A negative result does not preclude SARS-Cov-2 infection and should not be used as the sole basis for treatment or other patient management decisions. A negative result may occur with  improper specimen collection/handling, submission of specimen other than nasopharyngeal swab, presence of viral mutation(s) within the areas targeted by this assay, and inadequate number of viral copies(<138 copies/mL). A negative result must be combined with clinical observations, patient history, and epidemiological information. The expected result is Negative.  Fact Sheet for Patients:  EntrepreneurPulse.com.au  Fact Sheet for Healthcare Providers:  IncredibleEmployment.be  This test is no t yet approved or cleared by the [place] FDA and  has been authorized for detection and/or diagnosis of SARS-CoV-2 by FDA under an Emergency Use Authorization (EUA). This EUA  will remain  in effect (meaning this test can be used) for the duration of the COVID-19 declaration under Section 564(b)(1) of the Act, 21 U.S.C.section 360bbb-3(b)(1), unless the authorization is terminated  or revoked sooner.       Influenza A by PCR NEGATIVE NEGATIVE Final   Influenza B by PCR NEGATIVE NEGATIVE Final    Comment: (NOTE) The Xpert Xpress SARS-CoV-2/FLU/RSV plus assay is intended as an aid in the diagnosis of influenza from Nasopharyngeal swab specimens and should not be used as a sole basis for treatment. Nasal washings and aspirates are unacceptable for Xpert Xpress SARS-CoV-2/FLU/RSV testing.  Fact Sheet for Patients: EntrepreneurPulse.com.au  Fact Sheet for Healthcare Providers: IncredibleEmployment.be  This test is not yet approved or cleared by the [place] FDA and has been authorized for detection and/or diagnosis of SARS-CoV-2 by FDA under an Emergency Use Authorization (EUA). This EUA will remain in effect (meaning this test can be used) for the duration of the COVID-19 declaration under Section 564(b)(1) of the Act, 21 U.S.C. section 360bbb-3(b)(1), unless the authorization is terminated or revoked.  Performed at [facility], [address] [place], [place] [address]   MRSA Next Gen by PCR, Nasal     Status: None   Collection Time: 01/06/22  3:13 PM   Specimen: Nasal Mucosa; Nasal Swab  Result Value Ref Range Status   MRSA by PCR Next Gen NOT DETECTED NOT DETECTED Final    Comment: (NOTE) The GeneXpert MRSA Assay (FDA approved for NASAL specimens only), is one component of a comprehensive MRSA colonization surveillance program. It is not intended to diagnose MRSA infection nor to guide or monitor treatment for MRSA infections. Test performance is not FDA approved in patients less than 46 years old. Performed at [facility], [place] [address] [place], [place] [address]          Radiology  Studies: EEG adult  Result Date: 01-14-22 Lora Havens, MD     01/14/2022  5:45 PM Patient Name: ENES ROKOSZ MRN: [id number] Epilepsy Attending: Lora Havens Referring Physician/Provider: August Albino, NP Date: Jan 14, 2022 Duration: 23.06 mins Patient history:  80yo  with L parietal ICH and R temporal brain lesion. Patient had focal arm twitching yesterday. EEG to evaluate for seizure. Level of alertness: Awake AEDs during EEG study: LEV Technical aspects: This EEG study was done with scalp electrodes positioned according to the 10-20 International system of electrode placement. Electrical activity was acquired at a sampling rate of '500Hz'  and reviewed with a high frequency filter of '70Hz'  and a  low frequency filter of '1Hz' . EEG data were recorded continuously and digitally stored. Description: The posterior dominant rhythm consists of 8 Hz activity of moderate voltage (25-35 uV) seen predominantly in posterior head regions, asymmetric ( left<right) and reactive to eye opening and eye closing. Drowsiness was characterized by attenuation of the posterior background rhythm. EEG showed continuous polymorphic sharply contoured, at times rhythmic 3 to 6 Hz theta-delta slowing in left frontotemporal region. There is also intermittent generalized 3-'5hz'  theta-delta slowing. Hyperventilation and photic stimulation were not performed.   ABNORMALITY - Continuous slow, left frontotemporal region - Intermittent slow, generalized IMPRESSION: This study is suggestive of cortical dysfunction arising from  left frontotemporal region  likely secondary to underlying structural abnormality. No seizures or definite epileptiform discharges were seen throughout the recording. Lora Havens   CT CHEST ABDOMEN PELVIS W CONTRAST  Result Date: 01/10/2022 CLINICAL DATA:  Brain lesion, evaluate for primary malignancy EXAM: CT CHEST, ABDOMEN, AND PELVIS WITH CONTRAST TECHNIQUE: Multidetector CT imaging of the chest, abdomen and  pelvis was performed following the standard protocol during bolus administration of intravenous contrast. RADIATION DOSE REDUCTION: This exam was performed according to the departmental dose-optimization program which includes automated exposure control, adjustment of the mA and/or kV according to patient size and/or use of iterative reconstruction technique. CONTRAST:  48m OMNIPAQUE IOHEXOL 300 MG/ML  SOLN COMPARISON:  05/08/2012 FINDINGS: CT CHEST FINDINGS Study is limited by motion artifacts. Cardiovascular: There is homogeneous enhancement of the thoracic aorta. There are no filling defects in central pulmonary artery branches. Mediastinum/Nodes: There is interval decrease in size of slightly enlarged lymph nodes. No new significant lymphadenopathy is seen. There is inhomogeneous attenuation in thyroid with small coarse calcifications. Lungs/Pleura: Evaluation of lung fields is limited by breathing motion artifacts. Minimal bilateral pleural effusions are seen. There is no pneumothorax. There is a 10 mm nodule in left upper lobe in image 49 of series 5. There is 2.7 cm nodule in left lower lobe in image 70. In image 85, there is 10 mm nodule in left lower lobe. In image 93, there is 2.1 cm nodule in right lower lobe. There are linear densities in the lower lung fields suggesting scarring or subsegmental atelectasis. Musculoskeletal: There are no focal lytic or sclerotic lesions. There are a few old healed left rib fractures. Gynecomastia is seen in both breasts. CT ABDOMEN PELVIS FINDINGS Hepatobiliary: There is fatty infiltration. There is no dilation of bile ducts. There are calcified gallbladder stones. Motion artifacts limit evaluation of gallbladder wall. Pancreas: No focal abnormalities are seen. Spleen: Spleen is not visualized in its usual position. There is a 1.8 cm smoothly marginated soft tissue nodule in the anterior aspect of left upper quadrant, possibly a splenule. This finding appears stable.  Adrenals/Urinary Tract: There is 4.5 x 3 cm smooth marginated nodule in left adrenal. Density measurements are proximally 26 Hounsfield units. There is no hydronephrosis. There are no renal or ureteral stones. There are few small demyelinating lesions in both kidneys, possibly cysts. Evaluation is limited by motion artifacts. Ureters are not dilated. Foley catheter is seen in the bladder. Stomach/Bowel: There is moderate sized fixed hiatal hernia. There is no significant small bowel dilation. Appendix is not dilated. There is contrast in the lumen of the colon. There is no significant wall thickening in colon. Few diverticula are seen in the colon without signs of focal acute diverticulitis. Vascular/Lymphatic: Unremarkable. Reproductive: Prostate is enlarged. Other: There is no ascites or pneumoperitoneum. Left inguinal hernia containing fat is  seen. Small umbilical hernia containing fat is seen. Musculoskeletal: No focal lytic or sclerotic lesions are seen. Surgical hardware are noted in left SI joint and pubic symphysis. IMPRESSION: Motion limited study. There are multiple nodules in both lungs largest measuring 2.7 cm in the left lower lobe. This finding suggests pulmonary metastatic disease. There is 4.5 cm nodule in the left adrenal. This may suggest incidental adenoma or primary or metastatic malignant neoplasm. Follow-up MRI may be considered. No significant lymphadenopathy is seen in chest, abdomen and pelvis. No focal abnormalities are seen in liver. No focal lytic or sclerotic lesions in the bony structures. Enlarged prostate. Gallbladder stones. Few diverticula are seen in colon. Other findings as described in the body of the report. Electronically Signed   By: Elmer Picker M.D.   On: 01/10/2022 13:20   CT HEAD W & WO CONTRAST (5MM)  Result Date: 01/10/2022 CLINICAL DATA:  81 year old male code stroke presentation on 01/06/2022 with intracranial hemorrhage, and suspicion of hemorrhagic  metastatic disease on MRI. EXAM: CT HEAD WITHOUT AND WITH CONTRAST TECHNIQUE: Contiguous axial images were obtained from the base of the skull through the vertex without and with intravenous contrast. RADIATION DOSE REDUCTION: This exam was performed according to the departmental dose-optimization program which includes automated exposure control, adjustment of the mA and/or kV according to patient size and/or use of iterative reconstruction technique. CONTRAST:  70m OMNIPAQUE IOHEXOL 300 MG/ML  SOLN COMPARISON:  Brain MRI 01/09/2022 and earlier. FINDINGS: Brain: Posterior left hemisphere lobar type hemorrhage with mixed density blood products and surrounding edema has not significantly changed in size or configuration since 01/06/2022, estimated volume 61 mL. Regional mass effect is stable including on the posterior corpus callosum and left lateral ventricle. Only trace rightward midline shift. No IVH. No ventriculomegaly. No extra-axial extension of blood. Right temporal lobe hypodensity suspicious for cytotoxic edema appears stable since presentation. No new intracranial hemorrhage or new gray-white abnormality. Basilar cisterns remain patent. Vascular: Calcified atherosclerosis at the skull base. Skull: Highly heterogeneous bone mineralization in the clivus where decreased T1 marrow signal demonstrated recently by MRI (series 4, image 22). However, this abnormality has been present since neck CT 01/16/2021 and not significantly changed. Also, there is involvement of the left occipital condyle which by MRI appears more normal. No other suspicious skull lesion identified. No new osseous abnormality identified. Sinuses/Orbits: Visualized paranasal sinuses and mastoids are stable and well aerated. Other: No acute orbit or scalp soft tissue finding. IMPRESSION: 1. Stable non contrast CT appearance of the brain. Since 01/06/2022. Posterior left lobar hemorrhage (estimated 61 mL) with regional edema and mass effect.  Second area suspicious for vasogenic edema in the right temporal lobe. Only mild intracranial mass effect. 2. Abnormal bone mineralization in the clivus and left occipital condyle is nonspecific, but stable since July 2022 and could be Paget's disease or similar etiology. Electronically Signed   By: HGenevie AnnM.D.   On: 01/10/2022 12:45   MR BRAIN WO CONTRAST  Result Date: 01/09/2022 CLINICAL DATA:  Intracranial hemorrhage adjacent to parenchymal mass. Possible second lesion within the right temporal lobe. The examination had to be discontinued prior to completion due to severe patient motion. EXAM: MRI HEAD WITHOUT CONTRAST TECHNIQUE: Multiplanar, multiecho pulse sequences of the brain and surrounding structures were obtained without intravenous contrast. COMPARISON:  MR head without and with contrast 01/07/2022 FINDINGS: Brain: Right parietal hematoma is similar in size, measuring 4.9 x 3.2 cm on the axial images. Surrounding vasogenic edema is similar the  prior study. This effaces the posterior left lateral ventricle. 15 x 16 mm focal mass again noted in the posteromedial aspect of the hemorrhage. Focal T2 hyperintensity in the right temporal lobe is again seen. A distinct mass is seen on the T2 weighted images with similar characteristics to the other lesion, measuring 11 x 8 mm. No other foci of T2 hyperintensity or hemorrhage is present. Vascular: Flow is present in the major intracranial arteries. Skull and upper cervical spine: Unchanged Sinuses/Orbits: The paranasal sinuses and mastoid air cells are clear. Bilateral lens replacements are noted. Globes and orbits are otherwise unremarkable. IMPRESSION: 1. Stable size of right parietal hematoma with surrounding vasogenic edema and mass effect. 2. Stable size of posteromedial left parietal mass lesion adjacent to the hemorrhage. 3. Stable T2 hyperintensity in the right temporal lobe surrounding a focal 11 mm lesion with similar characteristics to the left  parietal lesion. Multiple lesions suggest metastatic disease. 4. No other acute intracranial abnormality or significant interval change. Electronically Signed   By: San Morelle M.D.   On: 01/09/2022 15:23        Scheduled Meds:  Chlorhexidine Gluconate Cloth  6 each Topical Daily   diclofenac Sodium  2 g Topical QID   Gerhardt's butt cream   Topical TID   heparin injection (subcutaneous)  5,000 Units Subcutaneous Q8H   insulin aspart  0-6 Units Subcutaneous TID WC   insulin aspart  0-9 Units Subcutaneous TID WC   labetalol  20 mg Intravenous Once   levETIRAcetam  500 mg Oral BID   losartan  12.5 mg Oral Daily   pantoprazole  40 mg Oral QHS   vitamin B-12  1,000 mcg Oral Daily   Continuous Infusions:  sodium chloride 50 mL/hr at 01/10/22 2221   clevidipine Stopped (01/06/22 1534)     LOS: 5 days   Time spent= 35 mins    Bernadette Armijo Arsenio Loader, MD [facility]  If 7PM-7AM, please contact night-coverage  01/11/2022, 12:33 PM

## 2022-01-12 DIAGNOSIS — Z7189 Other specified counseling: Secondary | ICD-10-CM | POA: Diagnosis not present

## 2022-01-12 DIAGNOSIS — C7931 Secondary malignant neoplasm of brain: Secondary | ICD-10-CM | POA: Diagnosis not present

## 2022-01-12 DIAGNOSIS — Z515 Encounter for palliative care: Secondary | ICD-10-CM | POA: Diagnosis not present

## 2022-01-12 DIAGNOSIS — I611 Nontraumatic intracerebral hemorrhage in hemisphere, cortical: Secondary | ICD-10-CM | POA: Diagnosis not present

## 2022-01-12 LAB — BASIC METABOLIC PANEL
Anion gap: 9 (ref 5–15)
BUN: 10 mg/dL (ref 8–23)
CO2: 23 mmol/L (ref 22–32)
Calcium: 8.1 mg/dL — ABNORMAL LOW (ref 8.9–10.3)
Chloride: 109 mmol/L (ref 98–111)
Creatinine, Ser: 1.16 mg/dL (ref 0.61–1.24)
GFR, Estimated: >60 mL/min (ref >60)
Glucose, Bld: 130 mg/dL — ABNORMAL HIGH (ref 70–99)
Potassium: 3.8 mmol/L (ref 3.5–5.1)
Sodium: 141 mmol/L (ref 135–145)

## 2022-01-12 LAB — GLUCOSE, CAPILLARY
Glucose-Capillary: 109 mg/dL — ABNORMAL HIGH (ref 70–99)
Glucose-Capillary: 191 mg/dL — ABNORMAL HIGH (ref 70–99)

## 2022-01-12 LAB — MAGNESIUM: Magnesium: 1.5 mg/dL — ABNORMAL LOW (ref 1.7–2.4)

## 2022-01-12 MED ORDER — ACETAMINOPHEN 325 MG PO TABS: 650.0000 mg | ORAL_TABLET | Freq: Four times a day (QID) | ORAL | Status: DC | PRN

## 2022-01-12 MED ORDER — HALOPERIDOL LACTATE 2 MG/ML PO CONC: 0.5000 mg | ORAL | Status: DC | PRN

## 2022-01-12 MED ORDER — MORPHINE SULFATE (PF) 2 MG/ML IV SOLN
2.0000 mg | INTRAVENOUS | Status: DC | PRN
  Administered 2022-01-13 (×2): 2 mg via INTRAVENOUS
  Filled 2022-01-12 (×2): qty 1

## 2022-01-12 MED ORDER — POLYVINYL ALCOHOL 1.4 % OP SOLN: 1.0000 [drp] | Freq: Four times a day (QID) | OPHTHALMIC | Status: DC | PRN

## 2022-01-12 MED ORDER — HALOPERIDOL LACTATE 5 MG/ML IJ SOLN
0.5000 mg | INTRAMUSCULAR | Status: DC | PRN
  Administered 2022-01-13: 0.5 mg via INTRAVENOUS
  Filled 2022-01-12: qty 1

## 2022-01-12 MED ORDER — BIOTENE DRY MOUTH MT LIQD: 15.0000 mL | OROMUCOSAL | Status: DC | PRN

## 2022-01-12 MED ORDER — ONDANSETRON HCL 4 MG/2ML IJ SOLN: 4.0000 mg | Freq: Four times a day (QID) | INTRAMUSCULAR | Status: DC | PRN

## 2022-01-12 MED ORDER — GLYCOPYRROLATE 0.2 MG/ML IJ SOLN: 0.2000 mg | INTRAMUSCULAR | Status: DC | PRN

## 2022-01-12 MED ORDER — ACETAMINOPHEN 650 MG RE SUPP: 650.0000 mg | Freq: Four times a day (QID) | RECTAL | Status: DC | PRN

## 2022-01-12 MED ORDER — HALOPERIDOL 0.5 MG PO TABS: 0.5000 mg | ORAL_TABLET | ORAL | Status: DC | PRN

## 2022-01-12 MED ORDER — MAGNESIUM SULFATE 4 GM/100ML IV SOLN
4.0000 g | Freq: Once | INTRAVENOUS | Status: AC
  Administered 2022-01-12: 4 g via INTRAVENOUS
  Filled 2022-01-12: qty 100

## 2022-01-12 MED ORDER — ONDANSETRON 4 MG PO TBDP: 4.0000 mg | ORAL_TABLET | Freq: Four times a day (QID) | ORAL | Status: DC | PRN

## 2022-01-12 MED ORDER — GLYCOPYRROLATE 1 MG PO TABS: 1.0000 mg | ORAL_TABLET | ORAL | Status: DC | PRN

## 2022-01-12 NOTE — Progress Notes (Signed)
Palliative: Mr. Orndoff is resting quietly in bed.  He appears acutely/chronically ill and very frail.  His eyes are closed, he will occasionally wake, making and somewhat keeping eye contact.  He is extremely hard of hearing, and now aphasic status post stroke.  He does not answer my orientation questions.  I am not sure that he can make his basic needs known.  His sister/healthcare power of attorney, Netta Corrigan, it is present along with sisters Hassan Rowan and Benjamine Mola, and aunts Lelon Frohlich and Tonia Ghent, and cousin Hoyle Sauer.  We talk about Mr. Fertig acute and chronic health concerns.  They shared their worry about his pulmonary nodules that were detected on CT in November of 2013.  We talk about expected outcomes and the treatment plan.  We talk about acute stroke in detail including sequela and the treatment plan.  We talk about Mr. Bunch desire to not live in a nursing home.     We talk about the benefits of residential hospice.  Family shares that both of their parents had hospice care with [facility].  We talked about the concept of "treat the treatable, but allowing natural passing", and take this a step further to the concept of "let nature take its course".  I share that modern medicine can prolong life, but can also prolong the dying process which prolongs suffering.     We talk about comfort care, and burdening Mr. Mcarthy for medications and treatments that are painful, and are not changing things for him.  We talk about how to make choices for loved ones including keeping them at the center, not what we want for them, and are we changing things for him.    Family is requesting referral to [place], [facility] for comfort and dignity at end-of-life.  We talk about a few "what if's, and maybe's".  We talk about acceptance to residential hospice and some options if he is not accepted.  Family asked if Mr. Portocarrero is, by some miracle, able to recover, what would happen.  We talk about social worker assisting  with disposition at that time.  We talk about unburdening Mr. Gundlach from medications and treatments that are not changing things, full comfort care.  Family is in agreement, orders adjusted.  Conference with attending, bedside nursing staff, transition of care team related to patient condition, needs, goals of care, disposition.  Plan:   Family is requesting comfort and dignity at end-of-life, residential hospice at [place], [facility], to let nature take its course. Orders adjusted, comfort care orders in place.  DNR/goldenrod form signed and placed on chart.  Prognosis: 2 to 4 weeks or less anticipated based on new acute stroke with right hemiplegia, suspected metastatic cancer burden lung and brain, poor by mouth intake.  54 minutes Quinn Axe, NP Palliative medicine team Team phone [phone] Greater than 50% of this time was spent counseling and coordinating care related to the above assessment and plan.

## 2022-01-12 NOTE — Discharge Summary (Signed)
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 Sinuses/Orbits: Bilateral lens surgery. Paranasal sinuses are clear. Other: None. IMPRESSION: 1. Severely motion degraded study redemonstrated large left parietal intraparenchymal hematoma measuring up to 5.8 cm with local mass effect without evidence of hydrocephalus or entrapment. A 2.6 cm mass lesion is identified in the medial aspect of the intraparenchymal hemorrhage. 2. T2 hyperintensity within the white matter of the right temporal lobe. Underlying lesion cannot be excluded given severe motion artifact. 3. Recommend repeat study when clinically appropriate. Electronically Signed   By: KPedro EarlsM.D.   On: 01/07/2022 13:39   ECHOCARDIOGRAM COMPLETE  Result Date: 01/07/2022    ECHOCARDIOGRAM REPORT   Patient Name:   JARTYOM STENCELDYE Date of Exam: 01/07/2022 Medical Rec #:  [id number]  Height:       72.0 in Accession #:    [id number] Weight:       219.4 lb Date of Birth:  904/04/1941  BSA:          2.216 m Patient Age:    89years    BP:           141/76 mmHg  Patient Gender: M           HR:           103 bpm. Exam Location:  Inpatient Procedure: 2D Echo, Cardiac Doppler and Color Doppler Indications:    Stroke  History:        Patient has no prior history of Echocardiogram examinations.                 CHF; Risk Factors:Hypertension and Diabetes.  Sonographer:    TJyl HeinzReferring Phys: [id number] ARORA  Sonographer Comments: Image acquisition challenging due to uncooperative patient. IMPRESSIONS  1. Left ventricular ejection fraction, by estimation, is 60 to 65%. The left ventricle has normal function. The left ventricle has no regional wall motion abnormalities. Left ventricular diastolic parameters are indeterminate.  2. Peak RV-RA gradient 30 mmHg. Unable to visualize IVC.  Right ventricular systolic function is normal. The right ventricular size is normal.  3. Left atrial size was moderately dilated.  4. Right atrial size was moderately dilated.  5. The mitral valve is normal in structure. Trivial mitral valve regurgitation. No evidence of mitral stenosis.  6. The aortic valve is tricuspid. There is mild calcification of the aortic valve. Aortic valve regurgitation is not visualized. No aortic stenosis is present.  7. The patient was in atrial fibrillation. FINDINGS  Left Ventricle: Left ventricular ejection fraction, by estimation, is 60 to 65%. The left ventricle has normal function. The left ventricle has no regional wall motion abnormalities. The left ventricular internal cavity size was normal in size. There is  no left ventricular hypertrophy. Left ventricular diastolic parameters are indeterminate. Right Ventricle: Peak RV-RA gradient 30 mmHg. Unable to visualize IVC. The right ventricular size is normal. No increase in right ventricular wall thickness. Right ventricular systolic function is normal. Left Atrium: Left atrial size was moderately dilated. Right Atrium: Right atrial size was moderately dilated. Pericardium: There is no evidence of  pericardial effusion. Mitral Valve: The mitral valve is normal in structure. Mild mitral annular calcification. Trivial mitral valve regurgitation. No evidence of mitral valve stenosis. Tricuspid Valve: The tricuspid valve is normal in structure. Tricuspid valve regurgitation is trivial. Aortic Valve: The aortic valve is tricuspid. There is mild calcification of the aortic valve. Aortic valve regurgitation is not visualized. No aortic stenosis is present. Aortic valve peak gradient measures 10.5 mmHg. Pulmonic Valve: The pulmonic valve was normal in structure. Pulmonic valve regurgitation is not visualized. Aorta: The aortic root is normal in size and structure. Venous: The inferior vena cava was not well visualized. IAS/Shunts: No atrial level shunt detected by color flow Doppler.  LEFT VENTRICLE PLAX 2D LVIDd:         5.40 cm     Diastology LVIDs:         3.60 cm     LV e' medial:    10.10 cm/s LV PW:         1.00 cm     LV E/e' medial:  9.9 LV IVS:        0.90 cm     LV e' lateral:   9.17 cm/s LVOT diam:     2.20 cm     LV E/e' lateral: 10.9 LV SV:         69 LV SV Index:   31 LVOT Area:     3.80 cm  LV Volumes (MOD) LV vol d, MOD A2C: 91.9 ml LV vol d, MOD A4C: 70.0 ml LV vol s, MOD A2C: 38.8 ml LV vol s, MOD A4C: 30.7 ml LV SV MOD A2C:     53.1 ml LV SV MOD A4C:     70.0 ml LV SV MOD BP:      47.6 ml RIGHT VENTRICLE RV Basal diam:  3.60 cm RV Mid diam:    3.40 cm RV S prime:     12.70 cm/s TAPSE (M-mode): 1.8 cm LEFT ATRIUM              Index        RIGHT ATRIUM           Index LA diam:        5.10 cm  2.30 cm/m   RA Area:     24.00 cm LA Vol (A2C):   102.0 ml 46.03 ml/m  RA Volume:   71.10 ml  32.08 ml/m  LA Vol (A4C):   92.9 ml  41.92 ml/m LA Biplane Vol: 99.4 ml  44.85 ml/m  AORTIC VALVE AV Area (Vmax): 2.58 cm AV Vmax:        162.00 cm/s AV Peak Grad:   10.5 mmHg LVOT Vmax:      110.00 cm/s LVOT Vmean:     73.200 cm/s LVOT VTI:       0.182 m  AORTA Ao Root diam: 3.40 cm Ao Asc diam:  3.30 cm MITRAL  VALVE               TRICUSPID VALVE MV Area (PHT): 4.68 cm    TR Peak grad:   30.0 mmHg MV Decel Time: 162 msec    TR Vmax:        274.00 cm/s MV E velocity: 99.80 cm/s                            SHUNTS                            Systemic VTI:  0.18 m                            Systemic Diam: 2.20 cm Dalton McleanMD Electronically signed by Franki Monte Signature Date/Time: 01/07/2022/10:06:15 AM    Final    EEG adult  Result Date: 01/07/2022 Lora Havens, MD     01/07/2022  9:02 AM Patient Name: Justin [name] MRN: [id number] Epilepsy Attending: Lora Havens Referring Physician/Provider: Greta Doom, MD Date: 01/07/2022 Duration: 22.32 mins Patient history: 81 year old man presenting for evaluation of right-sided numbness and weakness having been found down on the ground.  CT head with a left parietal occipital ICH. EEG to evaluate for seizure Level of alertness:  lethargic AEDs during EEG study: None Technical aspects: This EEG study was done with scalp electrodes positioned according to the 10-20 International system of electrode placement. Electrical activity was acquired at a sampling rate of '500Hz'$  and reviewed with a high frequency filter of '70Hz'$  and a low frequency filter of '1Hz'$ . EEG data were recorded continuously and digitally stored. Description: EEG showed near continuous 6 to 8 Hz theta alpha activity in left hemisphere.  There is EEG attenuation in right hemisphere.  There is also intermittent sharply contoured rhythmic 2 to 3 Hz delta slowing in left hemisphere without definite evolution.  Hyperventilation and photic stimulation were not performed.   ABNORMALITY -Lateralized rhythmic delta activity, left hemisphere -Continuous slow IMPRESSION: This study is suggestive of cortical dysfunction in left hemisphere which is on the ictal-interictal continuum with low to intermediate potential for seizures. Additionally there is moderate to severe diffuse encephalopathy, nonspecific to  etiology.  No definite seizures were seen throughout the recording. If suspicion for ictal-interictal activity remains a concern, a prolonged study can be considered. Lora Havens   CT ANGIO HEAD NECK W WO CM  Result Date: 01/07/2022 CLINICAL DATA:  Follow-up examination for intracranial hemorrhage. EXAM: CT ANGIOGRAPHY HEAD AND NECK TECHNIQUE: Multidetector CT imaging of the head and neck was performed using the standard protocol during bolus administration of intravenous contrast. Multiplanar CT image reconstructions and MIPs were obtained to evaluate the vascular anatomy. Carotid stenosis measurements (when applicable) are obtained utilizing NASCET criteria, using the distal internal carotid diameter as the denominator. RADIATION DOSE REDUCTION: This exam was performed  according to the departmental dose-optimization program which includes automated exposure control, adjustment of the mA and/or kV according to patient size and/or use of iterative reconstruction technique. CONTRAST:  97m OMNIPAQUE IOHEXOL 350 MG/ML SOLN COMPARISON:  Prior CT from earlier the same day. FINDINGS: CT HEAD FINDINGS Brain: Again seen is a large intraparenchymal hemorrhage centered at the left parieto-occipital region. This measures 5.9 x 5.0 x 4.3 cm in greatest dimensions (estimated volume 63 mL, previously 64 mL when measured in a similar fashion). Surrounding vasogenic edema and regional mass effect with trace left-to-right shift, similar to prior. Possible trace extra-axial extension of hemorrhage along the posterior falx noted, also stable. No visible intraventricular extension. No new intracranial hemorrhage since prior. Parenchymal hypodensity involving the right temporal lobe again noted, nonspecific, but stable from prior. This could potentially reflect a more subacute evolving cortical contusion at this location. No other acute large vessel territory infarct. No definite mass lesion. Ventricular size and morphology is  relatively stable without hydrocephalus or trapping. Underlying atrophy with chronic small vessel ischemic disease again noted. Vascular: No hyperdense vessel. Calcified atherosclerosis present about the skull base. Skull: Scalp soft tissues demonstrate no acute finding. Calvarium intact. Sinuses: Paranasal sinuses remain largely clear. Trace left mastoid effusion noted. Orbits: Globes orbital soft tissues demonstrate no acute finding. Review of the MIP images confirms the above findings CTA NECK FINDINGS Aortic arch: Visualized aortic arch normal caliber with standard 3 vessel morphology. Mild for age atheromatous change about the arch itself. No stenosis about the origin the great vessels. Right carotid system: Right common and internal carotid arteries patent without stenosis or dissection. Left carotid system: Left common and internal carotid arteries patent without stenosis or dissection. Vertebral arteries: Both vertebral arteries arise from the subclavian arteries. No proximal subclavian artery stenosis. Vertebral arteries patent without significant stenosis or dissection. Skeleton: Heterogeneous appearance of the clivus and left occipital condyle, of uncertain etiology or significance, but stable from prior neck CT from 01/16/2021, suggesting a benign etiology. No other discrete or worrisome osseous lesions. 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 Additional moderate spondylosis noted at C4-5 through C6-7. Patient is edentulous. Other neck: No other acute soft tissue abnormality within the neck. 1 cm right thyroid nodule noted, of doubtful significance given size and patient age, no follow-up imaging recommended (ref: J Am Coll Radiol. 2015 Feb;12(2): 143-50). Upper chest: 11 mm subpleural nodule present at the peripheral left upper lobe (series 10, image 10). An additional smaller 5 mm nodule noted within the adjacent left upper lobe as well (series 10, image 13). Visualized  upper chest demonstrates no other acute finding. Review of the MIP images confirms the above findings CTA HEAD FINDINGS Anterior circulation: Petrous segments patent bilaterally. Mild for age atheromatous change within the carotid siphons without significant stenosis. A1 segments patent. Normal anterior communicating artery complex. Anterior cerebral arteries patent without stenosis. No M1 stenosis or occlusion. No proximal MCA branch occlusion. Distal MCA branches well perfused bilaterally. Asymmetric prominence of the vascularity within the left cerebral hemisphere felt to be reactive in nature due to the adjacent bleed and/or edema. Posterior circulation: Both vertebral arteries patent to the vertebrobasilar junction without significant stenosis. Left vertebral artery slightly dominant. Both PICA patent. Basilar patent to its distal aspect without stenosis. Superior cerebellar arteries patent bilaterally. Both PCAs primarily supplied via the basilar. Right PCA widely patent to its distal aspect. Left PCA is patent proximally, not well seen distally, likely due to edema from the adjacent hemorrhage  in this region. Venous sinuses: Grossly patent allowing for timing the contrast bolus. The mid and anterior aspect of the superior sagittal sinus is unopacified on this exam. Anatomic variants: None significant. No vascular malformation seen underlying the acute cerebral hemorrhage. No intracranial aneurysm. Review of the MIP images confirms the above findings IMPRESSION: CT HEAD IMPRESSION: 1. No significant interval change in size and morphology of the acute intraparenchymal hemorrhage positioned at the left parieto-occipital region, estimated volume 63 mL. Similar surrounding edema with mild left-to-right shift. 2. Similar appearance of parenchymal hypodensity involving the right temporal lobe. Finding remains indeterminate, but could potentially reflect edema related to an evolving subacute cortical contusion at  this location. Possible underlying mass not excluded. Attention at follow-up MRI recommended. 3. No other new acute intracranial abnormality. CTA HEAD AND NECK IMPRESSION: 1. Negative CTA for large vessel occlusion or other emergent finding. No vascular malformation seen underlying the acute left cerebral hemorrhage. 2. Mild for age atheromatous disease. No hemodynamically significant or correctable stenosis about the major arterial vasculature of the head and neck. 3. Few left upper lobe pulmonary nodules as above, largest of which measures 11 mm. These are incompletely assessed on this exam. Per Fleischner Society Guidelines, recommend prompt non-contrast Chest CT for further evaluation. These guidelines do not apply to immunocompromised patients and patients with cancer. Follow up in patients with significant comorbidities as clinically warranted. For lung cancer screening, adhere to Lung-RADS guidelines. Reference: Radiology. 2017; 284(1):228-43. Electronically Signed   By: Jeannine Boga M.D.   On: 01/07/2022 04:27   DG Pelvis Portable  Result Date: 01/06/2022 CLINICAL DATA:  Intracerebral hemorrhage EXAM: PORTABLE PELVIS 1-2 VIEWS COMPARISON:  None Available. FINDINGS: Plate and screw fixation device across the pubic symphysis. Screw across the left SI joint. Hip joints are symmetric and unremarkable. No acute bony abnormality. Specifically, no fracture, subluxation, or dislocation. IMPRESSION: No acute bony abnormality. Electronically Signed   By: Rolm Baptise M.D.   On: 01/06/2022 21:05   DG Abd 1 View  Result Date: 01/06/2022 CLINICAL DATA:  Intracerebral hemorrhage EXAM: ABDOMEN - 1 VIEW COMPARISON:  None Available. FINDINGS: Nonobstructive bowel gas pattern. No organomegaly or free air. Calcified gallstones noted in the right upper quadrant. IMPRESSION: No acute findings. Cholelithiasis. Electronically Signed   By: Rolm Baptise M.D.   On: 01/06/2022 21:04   DG CHEST PORT 1 VIEW  Result  Date: 01/06/2022 CLINICAL DATA:  Intracerebral hemorrhage EXAM: PORTABLE CHEST 1 VIEW COMPARISON:  03/08/2016 FINDINGS: Moderate-sized hiatal hernia. Heart is normal size. Scarring in the left lung base. Rounded density in the left mid lung measuring 3 cm. Old left rib fractures. No acute bony abnormality. IMPRESSION: 3 cm rounded nodular density in the left mid lung concerning for pulmonary nodule/mass. This could be further evaluated with chest CT. Electronically Signed   By: Rolm Baptise M.D.   On: 01/06/2022 21:04   CT HEAD CODE STROKE WO CONTRAST  Result Date: 01/06/2022 CLINICAL DATA:  Code stroke. Neuro deficit, acute, stroke suspected. EXAM: CT HEAD WITHOUT CONTRAST TECHNIQUE: Contiguous axial images were obtained from the base of the skull through the vertex without intravenous contrast. RADIATION DOSE REDUCTION: This exam was performed according to the departmental dose-optimization program which includes automated exposure control, adjustment of the mA and/or kV according to patient size and/or use of iterative reconstruction technique. COMPARISON:  No pertinent prior exams available for comparison. FINDINGS: Brain: Mild-to-moderate cerebral atrophy. Mild cerebellar atrophy. Large heterogeneous parenchymal hemorrhage centered within the left parietal lobe,  measuring 5.0 x 4.6 x 5.6 cm. Moderate surrounding edema. Associated mass effect with partial effacement of the posterior left lateral ventricle. 2 mm rightward midline shift. 5.0 X 1.5 cm focus of abnormal hypodensity within the right temporal lobe periventricular white matter, indeterminate in etiology (for instance as seen on series 3, images 9-11). Cavum septum pellucidum and cavum vergae Vascular: No hyperdense vessel. Atherosclerotic calcifications. Skull: No fracture or aggressive osseous lesion. Sinuses/Orbits: No mass or acute finding within the imaged orbits. No significant paranasal sinus disease at the imaged levels. These results were  called by telephone at the time of interpretation on 01/06/2022 at 11:00 am to provider [facility] , who verbally acknowledged these results. IMPRESSION: Large acute parenchymal hemorrhage centered within the left parietal lobe, measuring 5.0 x 5.6 x 4.6 cm. Given the heterogeneous appearance of this hemorrhage, a brain MRI without and with contrast is recommended to assess for an underlying mass. Moderate surrounding edema. Mass effect with partial effacement of the posterior left lateral ventricle and 2 mm rightward midline shift. 5.0 x 1.5 cm focus of abnormal hypodensity within the right temporal lobe periventricular white matter, indeterminate in etiology. Attention to this site is recommended at time of MRI follow-up. Mild-to-moderate cerebral atrophy. Comparatively mild cerebellar atrophy. Electronically Signed   By: Kellie Simmering D.O.   On: 01/06/2022 11:12     The results of significant diagnostics from this hospitalization (including imaging, microbiology, ancillary and laboratory) are listed below for reference.     Microbiology: Recent Results (from the past 240 hour(s))  Resp Panel by RT-PCR (Flu A&B, Covid) Anterior Nasal Swab     Status: None   Collection Time: 01/06/22 10:35 AM   Specimen: Anterior Nasal Swab  Result Value Ref Range Status   SARS Coronavirus 2 by RT PCR NEGATIVE NEGATIVE Final    Comment: (NOTE) SARS-CoV-2 target nucleic acids are NOT DETECTED.  The SARS-CoV-2 RNA is generally detectable in upper respiratory specimens during the acute phase of infection. The lowest concentration of SARS-CoV-2 viral copies this assay can detect is 138 copies/mL. A negative result does not preclude SARS-Cov-2 infection and should not be used as the sole basis for treatment or other patient management decisions. A negative result may occur with  improper specimen collection/handling, submission of specimen other than nasopharyngeal swab, presence of viral mutation(s) within  the areas targeted by this assay, and inadequate number of viral copies(<138 copies/mL). A negative result must be combined with clinical observations, patient history, and epidemiological information. The expected result is Negative.  Fact Sheet for Patients:  EntrepreneurPulse.com.au  Fact Sheet for Healthcare Providers:  IncredibleEmployment.be  This test is no t yet approved or cleared by the [place] FDA and  has been authorized for detection and/or diagnosis of SARS-CoV-2 by FDA under an Emergency Use Authorization (EUA). This EUA will remain  in effect (meaning this test can be used) for the duration of the COVID-19 declaration under Section 564(b)(1) of the Act, 21 U.S.C.section 360bbb-3(b)(1), unless the authorization is terminated  or revoked sooner.       Influenza A by PCR NEGATIVE NEGATIVE Final   Influenza B by PCR NEGATIVE NEGATIVE Final    Comment: (NOTE) The Xpert Xpress SARS-CoV-2/FLU/RSV plus assay is intended as an aid in the diagnosis of influenza from Nasopharyngeal swab specimens and should not be used as a sole basis for treatment. Nasal washings and aspirates are unacceptable for Xpert Xpress SARS-CoV-2/FLU/RSV testing.  Fact Sheet for Patients: EntrepreneurPulse.com.au  Fact Sheet  for Healthcare Providers: IncredibleEmployment.be  This test is not yet approved or cleared by the [place] and has been authorized for detection and/or diagnosis of SARS-CoV-2 by FDA under an Emergency Use Authorization (EUA). This EUA will remain in effect (meaning this test can be used) for the duration of the COVID-19 declaration under Section 564(b)(1) of the Act, 21 U.S.C. section 360bbb-3(b)(1), unless the authorization is terminated or revoked.  Performed at [facility], [address] [place], [place] [address]   MRSA Next Gen by PCR, Nasal     Status: None   Collection  Time: 01/06/22  3:13 PM   Specimen: Nasal Mucosa; Nasal Swab  Result Value Ref Range Status   MRSA by PCR Next Gen NOT DETECTED NOT DETECTED Final    Comment: (NOTE) The GeneXpert MRSA Assay (FDA approved for NASAL specimens only), is one component of a comprehensive MRSA colonization surveillance program. It is not intended to diagnose MRSA infection nor to guide or monitor treatment for MRSA infections. Test performance is not FDA approved in patients less than 22 years old. Performed at [facility], [place] [address], [place], [place] [address]      Labs: BNP (last 3 results) No results for input(s): "BNP" in the last 8760 hours. Basic Metabolic Panel: Recent Labs  Lab 01/08/22 0438 01/09/22 0320 01/10/22 0151 01/11/22 0452 01/12/22 0455  NA 141 140 138 140 141  K 3.5 3.0* 3.1* 3.4* 3.8  CL 115* 110 111 112* 109  CO2 20* 20* 19* 22 23  GLUCOSE 113* 133* 99 126* 130*  BUN '13 13 13 10 10  '$ CREATININE 1.29* 1.35* 1.30* 1.25* 1.16  CALCIUM 7.9* 7.8* 7.6* 8.1* 8.1*  MG  --   --  1.0* 1.7 1.5*  PHOS  --  2.7 2.7  --   --    Liver Function Tests: No results for input(s): "AST", "ALT", "ALKPHOS", "BILITOT", "PROT", "ALBUMIN" in the last 168 hours.  No results for input(s): "LIPASE", "AMYLASE" in the last 168 hours. No results for input(s): "AMMONIA" in the last 168 hours. CBC: Recent Labs  Lab 01/07/22 0810 01/08/22 0438 01/09/22 0320 01/10/22 0151 01/11/22 0452  WBC 16.0* 14.2* 12.3* 12.1* 10.8*  HGB 10.7* 10.3* 9.7* 10.0* 10.3*  HCT 32.4* 31.8* 29.1* 31.0* 32.3*  MCV 95.0 96.4 94.2 95.7 96.1  PLT 268 209 239 291 336   Cardiac Enzymes: No results for input(s): "CKTOTAL", "CKMB", "CKMBINDEX", "TROPONINI" in the last 168 hours. BNP: Invalid input(s): "POCBNP" CBG: Recent Labs  Lab 01/11/22 1237 01/11/22 1620 01/11/22 2132 01/12/22 0616 01/12/22 1158  GLUCAP 181* 170* 190* 109* 191*   D-Dimer No results for input(s): "DDIMER" in the last 72  hours. Hgb A1c No results for input(s): "HGBA1C" in the last 72 hours. Lipid Profile No results for input(s): "CHOL", "HDL", "LDLCALC", "TRIG", "CHOLHDL", "LDLDIRECT" in the last 72 hours. Thyroid function studies No results for input(s): "TSH", "T4TOTAL", "T3FREE", "THYROIDAB" in the last 72 hours.  Invalid input(s): "FREET3" Anemia work up No results for input(s): "VITAMINB12", "FOLATE", "FERRITIN", "TIBC", "IRON", "RETICCTPCT" in the last 72 hours. Urinalysis    Component Value Date/Time   COLORURINE YELLOW 01/06/2022 1803   APPEARANCEUR CLEAR 01/06/2022 1803   LABSPEC 1.014 01/06/2022 1803   PHURINE 5.0 01/06/2022 1803   GLUCOSEU NEGATIVE 01/06/2022 1803   HGBUR NEGATIVE 01/06/2022 1803   BILIRUBINUR NEGATIVE 01/06/2022 1803   KETONESUR NEGATIVE 01/06/2022 1803   PROTEINUR 30 (A) 01/06/2022 1803   UROBILINOGEN 0.2 03/24/2012  [place] 01/06/2022 1803   LEUKOCYTESUR NEGATIVE 01/06/2022 1803   Sepsis Labs Recent Labs  Lab 01/08/22 0438 01/09/22 0320 01/10/22 0151 01/11/22 0452  WBC 14.2* 12.3* 12.1* 10.8*   Microbiology Recent Results (from the past 240 hour(s))  Resp Panel by RT-PCR (Flu A&B, Covid) Anterior Nasal Swab     Status: None   Collection Time: 01/06/22 10:35 AM   Specimen: Anterior Nasal Swab  Result Value Ref Range Status   SARS Coronavirus 2 by RT PCR NEGATIVE NEGATIVE Final    Comment: (NOTE) SARS-CoV-2 target nucleic acids are NOT DETECTED.  The SARS-CoV-2 RNA is generally detectable in upper respiratory specimens during the acute phase of infection. The lowest concentration of SARS-CoV-2 viral copies this assay can detect is 138 copies/mL. A negative result does not preclude SARS-Cov-2 infection and should not be used as the sole basis for treatment or other patient management decisions. A negative result may occur with  improper specimen collection/handling, submission of specimen other than nasopharyngeal swab, presence of viral  mutation(s) within the areas targeted by this assay, and inadequate number of viral copies(<138 copies/mL). A negative result must be combined with clinical observations, patient history, and epidemiological information. The expected result is Negative.  Fact Sheet for Patients:  EntrepreneurPulse.com.au  Fact Sheet for Healthcare Providers:  IncredibleEmployment.be  This test is no t yet approved or cleared by the [place] FDA and  has been authorized for detection and/or diagnosis of SARS-CoV-2 by FDA under an Emergency Use Authorization (EUA). This EUA will remain  in effect (meaning this test can be used) for the duration of the COVID-19 declaration under Section 564(b)(1) of the Act, 21 U.S.C.section 360bbb-3(b)(1), unless the authorization is terminated  or revoked sooner.       Influenza A by PCR NEGATIVE NEGATIVE Final   Influenza B by PCR NEGATIVE NEGATIVE Final    Comment: (NOTE) The Xpert Xpress SARS-CoV-2/FLU/RSV plus assay is intended as an aid in the diagnosis of influenza from Nasopharyngeal swab specimens and should not be used as a sole basis for treatment. Nasal washings and aspirates are unacceptable for Xpert Xpress SARS-CoV-2/FLU/RSV testing.  Fact Sheet for Patients: EntrepreneurPulse.com.au  Fact Sheet for Healthcare Providers: IncredibleEmployment.be  This test is not yet approved or cleared by the [place] FDA and has been authorized for detection and/or diagnosis of SARS-CoV-2 by FDA under an Emergency Use Authorization (EUA). This EUA will remain in effect (meaning this test can be used) for the duration of the COVID-19 declaration under Section 564(b)(1) of the Act, 21 U.S.C. section 360bbb-3(b)(1), unless the authorization is terminated or revoked.  Performed at [facility], [address] [place], [place] [address]   MRSA Next Gen by PCR, Nasal     Status: None    Collection Time: 01/06/22  3:13 PM   Specimen: Nasal Mucosa; Nasal Swab  Result Value Ref Range Status   MRSA by PCR Next Gen NOT DETECTED NOT DETECTED Final    Comment: (NOTE) The GeneXpert MRSA Assay (FDA approved for NASAL specimens only), is one component of a comprehensive MRSA colonization surveillance program. It is not intended to diagnose MRSA infection nor to guide or monitor treatment for MRSA infections. Test performance is not FDA approved in patients less than 84 years old. Performed at [facility], [place] [address] [place], [place] [address]      Time coordinating discharge:  I have spent 35 minutes face to face with the patient and  on the ward discussing the patients care, assessment, plan and disposition with other care givers. >50% of the time was devoted counseling the patient about the risks and benefits of treatment/Discharge disposition and coordinating care.   SIGNED:   Damita Lack, MD  [facility] 01/13/2022, 12:32 PM   If 7PM-7AM, please contact night-coverage

## 2022-01-12 NOTE — TOC Initial Note (Signed)
Transition of Care [facility]) - Initial/Assessment Note    Patient Details  Name: Justin [name] MRN: [id number] Date of Birth: Nov 03, 1940  [facility] CM/SW Contact:    Pollie Friar, RN Phone Number: 01/12/2022, 3:11 PM  Clinical Narrative:                 Plan is for residential hospice facility. CM was updated that the patients family prefers [facility] in [place]. CM called at 11:17am and left voicemail, 1:27pm and left voicemail and was able to talk with someone at 3:06 pm. They do not have beds available today. CM has updated the MD and will update the family.  TOC following.  Expected Discharge Plan: [place] Barriers to Discharge: Hospice Bed not available   Patient Goals and CMS Choice     Choice offered to / list presented to : Adult Children, Sibling  Expected Discharge Plan and Services Expected Discharge Plan: [place]     Post Acute Care Choice: Hospice                                        Prior Living Arrangements/Services                       Activities of Daily Living      Permission Sought/Granted                  Emotional Assessment              Admission diagnosis:  ICH (intracerebral hemorrhage) ([place]) [I61.9] Patient Active Problem List   Diagnosis Date Noted   ICH (intracerebral hemorrhage) ([place]) 01/06/2022   Skin ulcer ([place]) 11/09/2017   Dehydration    Hypokalemia    AKI (acute kidney injury) ([place]) 08/20/2015   Hypoglycemia 31/51/7616   Chronic systolic heart failure ([place]) 04/25/2014   Nonischemic cardiomyopathy ([place]) 03/01/2014   SOB (shortness of breath) 02/05/2014   Pain in joint, shoulder region 08/13/2013   Muscle weakness (generalized) 08/13/2013   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   Acute on chronic systolic CHF (congestive heart failure), NYHA class 4 ([place]) 05/16/2012   NICM, EF 25-30% 05/09/12, new c/w March 2012 05/13/2012    Normal coronary arteries by cath 05/12/12 05/13/2012   Permanent atrial fibrillation ([place]) 03/25/2012   HTN (hypertension) 03/25/2012   Cellulitis 03/24/2012   Diabetes mellitus ([place]) 03/24/2012   Urinary retention 03/24/2012   Hepatomegaly 03/24/2011   ANEMIA, IRON DEFICIENCY 08/18/2010   ANEMIA, B12 DEFICIENCY 08/18/2010   GASTROESOPHAGEAL REFLUX DISEASE, CHRONIC 08/18/2010   ABDOMINAL OR PELVIC SWELLING MASS OR LUMP RUQ 08/18/2010   PCP:  Lemmie Evens, MD Pharmacy:   [facility] [address] [place]  [address] [place] [address] Phone: [phone] Fax: [place], [address] AT [facility] [address] [place] [address] Phone: [phone] Fax: [phone]  [facility] [place], [place] [address] [address] [place] [address] Phone: [phone] Fax: [phone]  [id number] [place]: [phone]: [phone]    Social Determinants of Health (SDOH) Interventions    Readmission Risk Interventions  No data to display

## 2022-01-12 NOTE — Progress Notes (Signed)
OT Cancellation Note  Patient Details Name: Justin [name] MRN: [id number] DOB: 13-May-1941   Cancelled Treatment:    Reason Eval/Treat Not Completed: Other (comment) (OT orders discontinued)  Lynnda Child, OTD, OTR/L Acute Rehab [phone] - [place] 01/12/2022, 11:41 AM

## 2022-01-13 NOTE — TOC Transition Note (Signed)
Transition of Care [facility]) - CM/SW Discharge Note   Patient Details  Name: Justin [name] MRN: [id number] Date of Birth: [birth year]  [facility]) CM/SW Contact:  Pollie Friar, RN Phone Number: 01/13/2022, 12:32 PM   Clinical Narrative:    Pt is discharging to [facility] today. They called and have an available bed. MD and family updated. [facility] to provide transport.  Bedside RN updated.  Number for report: [phone]   Final next level of care: [place] Barriers to Discharge: No Barriers Identified   Patient Goals and CMS Choice     Choice offered to / list presented to : Sibling  Discharge Placement                       Discharge Plan and Services     Post Acute Care Choice: Hospice                               Social Determinants of Health (SDOH) Interventions     Readmission Risk Interventions     No data to display

## 2022-01-13 NOTE — Progress Notes (Signed)
Patient seen and examined at bedside.  Carries on a very basic conversation.  No complaints.  Family at bedside.  Vitals are stable.  Discharge summary completed 7/18, updated 7/19.  Gerlean Ren MD [facility]

## 2022-01-13 NOTE — TOC Progression Note (Signed)
Transition of Care [facility]) - Progression Note    Patient Details  Name: Justin [name] MRN: [id number] Date of Birth: 10/25/1940  Transition of Care [facility]) CM/SW Contact  Pollie Friar, RN Phone Number: 01/13/2022, 10:23 AM  Clinical Narrative:    CM spoke to [facility] and they do not have a bed today for patient to admit. CM has updated the MD and will update the family.  TOC following.   Expected Discharge Plan: [place] Barriers to Discharge: Hospice Bed not available  Expected Discharge Plan and Services Expected Discharge Plan: [place]     Post Acute Care Choice: Hospice   Expected Discharge Date: 01/13/22                                     Social Determinants of Health (SDOH) Interventions    Readmission Risk Interventions     No data to display

## 2022-02-26 DEATH — deceased

## 2022-06-17 ENCOUNTER — Ambulatory Visit: Payer: Medicare Other | Admitting: Cardiology
